# Patient Record
Sex: Male | Born: 1935 | Race: White | Hispanic: No | Marital: Married | Smoking: Former smoker
Health system: Southern US, Community
[De-identification: ages and names within clinical notes are randomized; demographics above are authoritative.]

## PROBLEM LIST (undated history)

## (undated) DIAGNOSIS — I10 Essential (primary) hypertension: Secondary | ICD-10-CM

## (undated) DIAGNOSIS — I499 Cardiac arrhythmia, unspecified: Secondary | ICD-10-CM

## (undated) DIAGNOSIS — G709 Myoneural disorder, unspecified: Secondary | ICD-10-CM

## (undated) DIAGNOSIS — M1990 Unspecified osteoarthritis, unspecified site: Secondary | ICD-10-CM

## (undated) DIAGNOSIS — C4490 Unspecified malignant neoplasm of skin, unspecified: Secondary | ICD-10-CM

## (undated) DIAGNOSIS — D649 Anemia, unspecified: Secondary | ICD-10-CM

## (undated) DIAGNOSIS — I639 Cerebral infarction, unspecified: Secondary | ICD-10-CM

## (undated) DIAGNOSIS — R0602 Shortness of breath: Secondary | ICD-10-CM

## (undated) HISTORY — DX: Myoneural disorder, unspecified: G70.9

## (undated) HISTORY — DX: Unspecified osteoarthritis, unspecified site: M19.90

## (undated) HISTORY — DX: Shortness of breath: R06.02

## (undated) HISTORY — PX: TONSILLECTOMY: SUR1361

## (undated) HISTORY — PX: CHOLECYSTECTOMY: SHX55

## (undated) HISTORY — DX: Essential (primary) hypertension: I10

## (undated) HISTORY — DX: Unspecified malignant neoplasm of skin, unspecified: C44.90

## (undated) HISTORY — DX: Cerebral infarction, unspecified: I63.9

---

## 1997-10-20 ENCOUNTER — Encounter: Admission: RE | Admit: 1997-10-20 | Discharge: 1998-01-18 | Payer: Self-pay | Admitting: Family Medicine

## 1998-09-29 ENCOUNTER — Encounter: Payer: Self-pay | Admitting: Orthopedic Surgery

## 1998-09-29 ENCOUNTER — Inpatient Hospital Stay (HOSPITAL_COMMUNITY): Admission: RE | Admit: 1998-09-29 | Discharge: 1998-10-06 | Payer: Self-pay | Admitting: Orthopedic Surgery

## 1998-10-13 ENCOUNTER — Inpatient Hospital Stay (HOSPITAL_COMMUNITY): Admission: EM | Admit: 1998-10-13 | Discharge: 1998-10-16 | Payer: Self-pay | Admitting: Emergency Medicine

## 1998-10-13 ENCOUNTER — Encounter: Payer: Self-pay | Admitting: Emergency Medicine

## 1999-04-04 ENCOUNTER — Emergency Department (HOSPITAL_COMMUNITY): Admission: EM | Admit: 1999-04-04 | Discharge: 1999-04-04 | Payer: Self-pay | Admitting: Emergency Medicine

## 1999-05-11 ENCOUNTER — Ambulatory Visit: Admission: RE | Admit: 1999-05-11 | Discharge: 1999-05-11 | Payer: Self-pay | Admitting: Otolaryngology

## 1999-05-12 ENCOUNTER — Encounter (INDEPENDENT_AMBULATORY_CARE_PROVIDER_SITE_OTHER): Payer: Self-pay | Admitting: Specialist

## 1999-05-12 ENCOUNTER — Ambulatory Visit (HOSPITAL_BASED_OUTPATIENT_CLINIC_OR_DEPARTMENT_OTHER): Admission: RE | Admit: 1999-05-12 | Discharge: 1999-05-13 | Payer: Self-pay | Admitting: Otolaryngology

## 2001-03-29 ENCOUNTER — Encounter (INDEPENDENT_AMBULATORY_CARE_PROVIDER_SITE_OTHER): Payer: Self-pay | Admitting: *Deleted

## 2001-03-29 ENCOUNTER — Other Ambulatory Visit: Admission: RE | Admit: 2001-03-29 | Discharge: 2001-03-29 | Payer: Self-pay | Admitting: Gastroenterology

## 2001-04-05 ENCOUNTER — Encounter: Admission: RE | Admit: 2001-04-05 | Discharge: 2001-04-05 | Payer: Self-pay | Admitting: Internal Medicine

## 2001-04-05 ENCOUNTER — Encounter (HOSPITAL_BASED_OUTPATIENT_CLINIC_OR_DEPARTMENT_OTHER): Payer: Self-pay | Admitting: Internal Medicine

## 2001-04-19 ENCOUNTER — Encounter: Admission: RE | Admit: 2001-04-19 | Discharge: 2001-06-19 | Payer: Self-pay | Admitting: Internal Medicine

## 2001-08-12 ENCOUNTER — Encounter: Admission: RE | Admit: 2001-08-12 | Discharge: 2001-09-21 | Payer: Self-pay | Admitting: Orthopedic Surgery

## 2001-08-14 ENCOUNTER — Encounter: Payer: Self-pay | Admitting: Orthopedic Surgery

## 2001-09-25 ENCOUNTER — Encounter: Payer: Self-pay | Admitting: Orthopedic Surgery

## 2001-09-25 ENCOUNTER — Encounter (HOSPITAL_BASED_OUTPATIENT_CLINIC_OR_DEPARTMENT_OTHER): Admission: RE | Admit: 2001-09-25 | Discharge: 2001-11-30 | Payer: Self-pay | Admitting: Orthopedic Surgery

## 2001-09-25 ENCOUNTER — Ambulatory Visit (HOSPITAL_COMMUNITY): Admission: RE | Admit: 2001-09-25 | Discharge: 2001-09-25 | Payer: Self-pay | Admitting: Orthopedic Surgery

## 2002-03-04 ENCOUNTER — Encounter (HOSPITAL_BASED_OUTPATIENT_CLINIC_OR_DEPARTMENT_OTHER): Admission: RE | Admit: 2002-03-04 | Discharge: 2002-06-02 | Payer: Self-pay | Admitting: Orthopedic Surgery

## 2002-06-03 ENCOUNTER — Encounter (HOSPITAL_BASED_OUTPATIENT_CLINIC_OR_DEPARTMENT_OTHER): Admission: RE | Admit: 2002-06-03 | Discharge: 2002-09-01 | Payer: Self-pay | Admitting: Internal Medicine

## 2002-09-09 ENCOUNTER — Encounter (HOSPITAL_BASED_OUTPATIENT_CLINIC_OR_DEPARTMENT_OTHER): Admission: RE | Admit: 2002-09-09 | Discharge: 2002-12-04 | Payer: Self-pay | Admitting: Internal Medicine

## 2002-12-05 ENCOUNTER — Encounter (HOSPITAL_BASED_OUTPATIENT_CLINIC_OR_DEPARTMENT_OTHER): Admission: RE | Admit: 2002-12-05 | Discharge: 2003-03-05 | Payer: Self-pay | Admitting: Internal Medicine

## 2003-11-20 ENCOUNTER — Encounter: Admission: RE | Admit: 2003-11-20 | Discharge: 2003-11-20 | Payer: Self-pay | Admitting: Internal Medicine

## 2004-08-27 ENCOUNTER — Ambulatory Visit: Payer: Self-pay | Admitting: Internal Medicine

## 2005-02-03 ENCOUNTER — Ambulatory Visit: Payer: Self-pay | Admitting: Internal Medicine

## 2005-05-26 ENCOUNTER — Ambulatory Visit: Payer: Self-pay | Admitting: Internal Medicine

## 2005-05-27 ENCOUNTER — Ambulatory Visit: Payer: Self-pay | Admitting: Internal Medicine

## 2005-09-27 ENCOUNTER — Ambulatory Visit: Payer: Self-pay | Admitting: Internal Medicine

## 2005-12-12 ENCOUNTER — Ambulatory Visit: Payer: Self-pay | Admitting: Internal Medicine

## 2006-01-06 ENCOUNTER — Encounter: Admission: RE | Admit: 2006-01-06 | Discharge: 2006-01-06 | Payer: Self-pay | Admitting: Internal Medicine

## 2006-03-06 ENCOUNTER — Ambulatory Visit (HOSPITAL_COMMUNITY): Admission: RE | Admit: 2006-03-06 | Discharge: 2006-03-07 | Payer: Self-pay | Admitting: General Surgery

## 2006-03-06 ENCOUNTER — Encounter (INDEPENDENT_AMBULATORY_CARE_PROVIDER_SITE_OTHER): Payer: Self-pay | Admitting: *Deleted

## 2006-04-11 ENCOUNTER — Ambulatory Visit: Payer: Self-pay | Admitting: Internal Medicine

## 2006-08-31 ENCOUNTER — Ambulatory Visit: Payer: Self-pay | Admitting: Internal Medicine

## 2006-09-01 ENCOUNTER — Ambulatory Visit (HOSPITAL_COMMUNITY): Admission: RE | Admit: 2006-09-01 | Discharge: 2006-09-02 | Payer: Self-pay | Admitting: Urology

## 2007-02-08 ENCOUNTER — Ambulatory Visit: Payer: Self-pay | Admitting: Internal Medicine

## 2007-03-02 ENCOUNTER — Ambulatory Visit: Payer: Self-pay | Admitting: Internal Medicine

## 2007-06-28 ENCOUNTER — Ambulatory Visit: Payer: Self-pay | Admitting: Internal Medicine

## 2007-12-06 ENCOUNTER — Ambulatory Visit: Payer: Self-pay | Admitting: Internal Medicine

## 2008-03-04 ENCOUNTER — Ambulatory Visit: Payer: Self-pay | Admitting: Internal Medicine

## 2008-03-04 DIAGNOSIS — J302 Other seasonal allergic rhinitis: Secondary | ICD-10-CM

## 2008-03-04 DIAGNOSIS — R609 Edema, unspecified: Secondary | ICD-10-CM

## 2008-03-04 DIAGNOSIS — E119 Type 2 diabetes mellitus without complications: Secondary | ICD-10-CM | POA: Insufficient documentation

## 2008-03-04 DIAGNOSIS — J3089 Other allergic rhinitis: Secondary | ICD-10-CM

## 2008-03-04 DIAGNOSIS — G4733 Obstructive sleep apnea (adult) (pediatric): Secondary | ICD-10-CM

## 2008-03-04 DIAGNOSIS — E669 Obesity, unspecified: Secondary | ICD-10-CM

## 2008-03-04 DIAGNOSIS — J45998 Other asthma: Secondary | ICD-10-CM | POA: Insufficient documentation

## 2008-03-14 ENCOUNTER — Encounter: Payer: Self-pay | Admitting: Internal Medicine

## 2008-04-24 ENCOUNTER — Ambulatory Visit: Payer: Self-pay | Admitting: Internal Medicine

## 2008-09-15 ENCOUNTER — Ambulatory Visit: Payer: Self-pay | Admitting: Internal Medicine

## 2009-02-02 ENCOUNTER — Ambulatory Visit: Payer: Self-pay | Admitting: Internal Medicine

## 2009-03-26 ENCOUNTER — Ambulatory Visit: Payer: Self-pay | Admitting: Internal Medicine

## 2009-07-08 ENCOUNTER — Ambulatory Visit: Payer: Self-pay | Admitting: Internal Medicine

## 2009-11-25 ENCOUNTER — Ambulatory Visit: Payer: Self-pay | Admitting: Internal Medicine

## 2010-04-23 ENCOUNTER — Ambulatory Visit: Payer: Self-pay | Admitting: Internal Medicine

## 2010-04-28 ENCOUNTER — Ambulatory Visit: Payer: Self-pay | Admitting: Internal Medicine

## 2010-04-29 ENCOUNTER — Ambulatory Visit: Payer: Self-pay | Admitting: Internal Medicine

## 2010-06-23 LAB — BASIC METABOLIC PANEL
BUN: 22 mg/dL (ref 6–23)
CO2: 25 mEq/L (ref 19–32)
Calcium: 9.2 mg/dL (ref 8.4–10.5)
Chloride: 96 mEq/L (ref 96–112)
Creatinine, Ser: 1.2 mg/dL (ref 0.4–1.5)
GFR calc Af Amer: >60 mL/min (ref >60)
GFR calc non Af Amer: 59 mL/min — ABNORMAL LOW (ref >60)
Glucose, Bld: 210 mg/dL — ABNORMAL HIGH (ref 70–99)
Potassium: 4.1 mEq/L (ref 3.5–5.1)
Sodium: 134 mEq/L — ABNORMAL LOW (ref 135–145)

## 2010-06-25 ENCOUNTER — Ambulatory Visit
Admission: RE | Admit: 2010-06-25 | Discharge: 2010-06-25 | Payer: Self-pay | Source: Home / Self Care | Attending: Orthopedic Surgery | Admitting: Orthopedic Surgery

## 2010-06-25 LAB — GLUCOSE, CAPILLARY
Glucose-Capillary: 160 mg/dL — ABNORMAL HIGH (ref 70–99)
Glucose-Capillary: 156 mg/dL — ABNORMAL HIGH (ref 70–99)

## 2010-06-25 LAB — POCT HEMOGLOBIN-HEMACUE: Hemoglobin: 12 g/dL — ABNORMAL LOW (ref 13.0–17.0)

## 2010-07-20 NOTE — Assessment & Plan Note (Signed)
Summary: 1 year/ mbw   Primary Provider/Referring Provider:  Jarome Carter  CC:  follow up visit for allergies--requesting flu shot today.  History of Present Illness: 77 year old man returning for follow-up of allergic rhinitis and asthma with sleep apnea.  Had failed CPAP tolerance after multiple mask changes.  He is status post UPPP.  Wife is here with him and indicates he only occasionally snores.  He is satisfied and notices a little daytime sleepiness.  Continues allergy vaccine at 1:10, giving his own without problems.  We reviewed risk, benefits, and epinephrine.  Uses occasional fexofenadine and Nasonex.  Rarely needs his nebulizer machine for wheezing and almost never needs rescue inhaler.  18-Apr-2009- OSA/ UPPP, rhinitis, asthma     yearly follow up visit-breathing good................................Marland Kitchenwife here Allergy- They feel allergy vaccine is good for him. No problem or concern. Got through most of year but blames ragweed now for stuffy nose and cough. hasn't needed neb treatment in long time. hasn't needed a rescue inhaler and they don't feel need for script now. Has had flu vax. OSA- Had failed CPAP trial- couldn't keep it on and turned it back in. UPPP.  April 23, 2010-  OSA/ UPPP, allergic rhinitis, asthma  ................Marland Kitchenwife here Needs flu shot. Continues allergy vaccine. Wife asserts they help. Continues at 1:10. OSA managed by UPPP. Wife denies he snores much.  Each winter they expect him to get a bronchits with colds. He keeps some cough and phlegm routinely as well as some throat clearing. Denies reflux. Does hear occsional wheeze. Uses neb only in the winter, with no rescue inhaler.     Asthma History    Initial Asthma Severity Rating:    Age range: 12+ years    Symptoms: daily    Nighttime Awakenings: 0-2/month    Interferes w/ normal activity: no limitations    SABA use (not for EIB): 0-2 days/week    Asthma Severity Assessment: Moderate  Persistent   Preventive Screening-Counseling & Management  Alcohol-Tobacco     Smoking Status: quit     Packs/Day: 1.5     Year Quit: 1986  Allergies (verified): No Known Drug Allergies  Comments:  Nurse/Medical Assistant: The patient's medications and allergies were reviewed with the patient and were updated in the Medication and Allergy Lists.  Past History:  Past Medical History: Last updated: 03/04/2008 OBSTRUCTIVE SLEEP APNEA (ICD-327.23) PERIPHERAL EDEMA (ICD-782.3) EXOGENOUS OBESITY (ICD-278.00) DIABETES MELLITUS (ICD-250.00) ALLERGIC RHINITIS (ICD-477.9) -Srtart vaccine 1992 ASTHMA (ICD-493.90)    Past Surgical History: Last updated: Apr 18, 2009 UPPP Tonsils Bilateral TKR Bilateral hip replacement Cholecystectomy  Family History: Last updated: 2009/04/18 Father- died MI Mother- died CVA  Social History: Last updated: 04/18/2009 Married Patient states former smoker.  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  The patient denies melena, shortness of breath at rest, non-productive cough, coughing up blood, chest pain, irregular heartbeats, acid heartburn, indigestion, loss of appetite, weight change, abdominal pain, difficulty swallowing, sore throat, tooth/dental problems, headaches, sneezing, itching, ear ache, anxiety, rash, and fever.         legs and feet swell, elastic hose, cut out diabetic shoes  Vital Signs:  Patient profile:   77 year old male  Weight:      283.50 pounds O2 Sat:      95 % on Room air Pulse rate:   67 / minute BP sitting:   144 / 62  (right arm) Cuff size:   regular  Vitals Entered By: Randell Loop CMA (April 23, 2010  3:18 PM)  O2 Sat at Rest %:  95 O2 Flow:  Room air CC: follow up visit for allergies--requesting flu shot today Is Patient Diabetic? No Pain Assessment Patient in pain? no      Comments meds updated today with pt   Physical Exam  Additional Exam:  General: A/Ox3; pleasant and cooperative, NAD, obese SKIN: no rash, lesions NODES: no lymphadenopathy HEENT: [place]/AT, EOM- WNL, Conjuctivae- clear, PERRLA, TM-WNL, Nose- clear, Throat- clear, s/p UPPP NECK: Supple w/ fair ROM, JVD- none, normal carotid impulses w/o bruits Thyroid- normal to palpation CHEST: Clear to P&A, trace rales in deep bases HEART: RRR, no m/g/r heard ABDOMEN: Soft and nl; nml bowel sounds; no organomegaly or masses noted ZOX:WRUE, nl pulses, 1+edema  NEURO: Grossly intact to observation, resting head tremor      Impression & Recommendations:  Problem # 1:  OBSTRUCTIVE SLEEP APNEA (ICD-327.23)  Contolled after UPP accordiing to wife. They don't describe significant snore or daytime sleepiness.  Weight loss would help  Problem # 2:  ALLERGIC RHINITIS (ICD-477.9)  Well contrrolled and he is comfortable that allergy vaccine helps His updated medication list for this problem includes:    Nasonex 50 Mcg/act Susp (Mometasone furoate) .Marland Kitchen... 2 sprays each nostril at bedtime    Fexofenadine Hcl 180 Mg Tabs (Fexofenadine hcl) .Marland Kitchen... Take 1 tablet by mouth once a day    Ipratropium Bromide 0.03 % Soln (Ipratropium bromide) .Marland Kitchen... Via neb as needed  Problem # 3:  ASTHMA (ICD-493.90) Denies active wheezing now. he is satisfied with his status. We are updating CXR and PFT.   Medications Added to Medication List This Visit: 1)  Tramadol Hcl 100 Mg Xr24h-tab (Tramadol hcl) .... Take one tablet by mouth three times a day as needed 2)  Fish Oil 1000 Mg Caps (Omega-3 fatty acids) .... Take 1 tablet by mouth once a day 3)  Vitamin B-12 1000 Mcg Tabs (Cyanocobalamin) .... Take 1 tablet by mouth once a day  Other  Orders: Est. Patient Level IV (45409) Misc. Referral (Misc. Ref) T-2 View CXR (71020TC) Influenza Vaccine MCR (81191)  Patient Instructions: 1)  Please schedule a follow-up appointment in 1 month. 2)  Continue allergy vaccine 3)  Flu vax 4)  See PCC to schedule PFT 5)  A chest x-ray has been recommended.  Your imaging study may require preauthorization.    Immunizations Administered:  Influenza Vaccine # 1:    Vaccine Type: Fluvax MCR    Site: left deltoid    Mfr: Novartis    Dose: 0.5 ml    Route: IM    Given by: Vivianne Spence    Exp. Date: 11/19/2010    Lot #: [id number]    VIS given: 01/12/10 version given April 26, 2010.  Flu Vaccine Consent Questions:    Do you have a history of severe allergic reactions to this vaccine? no    Any prior history of allergic reactions to egg and/or gelatin? no    Do you have a sensitivity to the preservative Thimersol? no    Do you have a past history of Guillan-Barre Syndrome? no    Do you currently have an acute febrile illness?  no    Have you ever had a severe reaction to latex? no    Vaccine information given and explained to patient? yes

## 2010-07-20 NOTE — Miscellaneous (Signed)
Summary: Orders Update pft charges  Clinical Lists Changes  Orders: Added new Service order of Carbon Monoxide diffusing w/capacity (94720) - Signed Added new Service order of Lung Volumes (94240) - Signed Added new Service order of Spirometry (Pre & Post) (94060) - Signed 

## 2010-09-30 ENCOUNTER — Other Ambulatory Visit: Payer: Self-pay

## 2010-11-02 NOTE — Assessment & Plan Note (Signed)
[facility]                             PULMONARY OFFICE NOTE   BARKLEY, KRATOCHVIL                        MRN:          [id number]  DATE:03/02/2007                            DOB:          Feb 03, 1932    PULMONARY FOLLOWUP   PROBLEM LIST:  1. Asthma.  2. Allergic rhinitis.  3. Diabetes.  4. Exogenous obesity.  5. Peripheral edema.  6. Obstructive sleep apnea/palatoplasty.   HISTORY:  Sleep apnea symptoms have been under good control and he feels  overall better compared with last visit.  Sometimes wakes at night.  Nothing specific.  Asthma has not been bad.  He uses his nebulized  albuterol and Atrovent very occasionally p.r.n.  Asks about tinea on his  trunk.  Wife continues to give his allergy vaccine at 1:10.  Today, we  went over risk benefit considerations of allergy vaccine and discussed  issues related to administration outside of a medical office,  anaphylaxis and epinephrine.  He wishes to continue as he is doing.   MEDICATIONS:  Reviewed.   OBJECTIVE:  Weight 269 pounds, which if correct is a significant  improvement compared with a year ago when he was recorded at 303 pounds,  BP 142/78, pulse 80, room air saturation 94%.  He uses a walker.  Very obese.  In no acute distress.  CHEST:  Clear.  Breathing was unlabored.  HEART:  Sounds are regular without murmur.  I find no adenopathy, neck  vein distension, or edema.   IMPRESSION:  Rhinitis, asthma, and obstructive sleep apnea are all under  good control.  Weight loss would make a substantial improvement.  Note,  a chest x-ray of June 2007 showed chronic changes, nothing acute.  Incidental tinea.   PLAN:  1. Check on PFT.  2. EpiPen prescription with discussion.  3. Vaccine discussion as above.  4. Environmental precautions and sleep hygiene reviewed.  Weight loss      encouraged.  5. Try Diflucan 150 mg x1 for his tinea.  6. Schedule return in 1 year, earlier p.r.n.     Clinton D. Maple Hudson, MD, Tonny Bollman, FACP  Electronically Signed    CDY/MedQ  DD: 03/03/2007  DT: 03/04/2007  Job #: [id number]   cc:   Barry Dienes. Eloise Harman, M.D.

## 2010-11-05 NOTE — Op Note (Signed)
NAME:  Preston Carter, Preston Carter                 ACCOUNT NO.:  [id number]   MEDICAL RECORD NO.:  [id number]          PATIENT TYPE:  AMB   LOCATION:  DAY                          FACILITY:  [facility]   PHYSICIAN:  Sigmund I. Patsi Sears, M.D.DATE OF BIRTH:  [birth year]   DATE OF PROCEDURE:  09/01/2006  DATE OF DISCHARGE:                               OPERATIVE REPORT   PREOPERATIVE DIAGNOSIS:  Diabetic phimosis.   POSTOPERATIVE DIAGNOSIS:  Diabetic phimosis.   OPERATION:  Circumcision.   SURGEON:  Sigmund I. Patsi Sears, M.D.   ANESTHESIA:  General LMA.Marland Kitchen   PREPARATION:  After appropriate preanesthesia, the patient is brought to  the operating room, placed on the operating room in the dorsal supine  position where general LMA anesthesia was induced.  He was then replaced  in dorsal lithotomy position where the pubis was prepped with Betadine  solution and draped in the usual fashion.   REVIEW OF HISTORY:  This 77 year old type 2 diabetic has chronic  balanitis, and phimosis, and is for circumcision today.  Note has past  history of sleep apnea, as well as hypertension, diabetes, asthma,  ulcers, and depression.   PROCEDURE:  After prepping the penis with Betadine solution and draped  in usual fashion, the marking pen was used to mark the site of  periglandular and pericoronal incisions.  The foreskin was clamped, and  excised.  Four separate quadrants were then developed  using four  Monocryl suture, and each quadrant was closed with interrupted 4-0  Monocryl suture.  Sterile dressing was applied.  The patient was  awakened and taken recovery room in good condition.  He was given  15 mg  IV Toradol, as well as IV Ancef at the beginning of case.      Sigmund I. Patsi Sears, M.D.  Electronically Signed     SIT/MEDQ  D:  09/01/2006  T:  09/02/2006  Job:  [id number]

## 2010-11-05 NOTE — Consult Note (Signed)
[facility]  Patient:    Preston Carter, Preston Carter Visit Number: [id number] MRN: [id number]          Service Type: FTC Location: FOOT Attending Physician:  Sharren Bridge Dictated by:   Jonelle Sports. Cheryll Cockayne, M.D. Proc. Date: 04/19/01 Admit Date:  04/19/2001   CC:         Preston Carter. Eloise Harman, M.D.   Consultation Report  HISTORY OF PRESENT ILLNESS:  This 77 year old white male with well controlled type 2 diabetes was referred for evaluation and management of left foot pain with question of Charcot neuroarthropathy. Apparently the pain has been present for a couple of months without any known injury and the patient was seen some three or four weeks ago by Dr. Eloise Harman who thought perhaps this was as Charcot process and x-rayed him one week ago with some confirmation of that diagnosis. He is accordingly referred here now for our further management. He has had no secondary cellulitis or other problems of which he is aware. He simply has some persistent discomfort in the right mid foot and advancing change in appearance of his foot.  PAST MEDICAL HISTORY:  The patient has question early Parkinsons disease and hypertension as well as some low back problems as well as his type 2 diabetes. He has no know medicinal allergies.  MEDICATIONS:  1. Glucophage.  2. Elavil.  3. Paxil.  4. Lipitor.  5. Darvocet-N 100.  6. Lotensin.  7. Maxzide.  8. Sinemet.  9. Ambien. 10. Magnesium. 11. Ventolin. 12. Nasal saline spray. 13. Multivitamin. 14. Theo-Dur. 15. 81 mg aspirin. 16. Allegra. 17. Voltaren. 18. Several other vitamins.  PHYSICAL EXAMINATION:  Examination today is limited to the distal lower extremities. The patients feet are without gross edema although there is very subtle swelling of the left foot in its entirety. Interestingly, the feet are characterized as well by considerable loss of longitudinal metatarsal arch and there has been a clear shift of  the mid foot toward the weightbearing surface particularly on the left. The toes are strikingly angulated in the fibular direction, not characteristic for what is seen with diabetic neuropathy but presumably on that basis. Skin temperatures are essentially normal in both feet but there is a differential of approximately 2 degrees on the left foot as compared to the right. Some areas of scattered dry skin but this is not striking. All pulses are palpable and adequate. Monofilament testing suggests that he has protective sensation throughout the surfaces of both feet but this is clearly diminished.  IMPRESSION:   Charcot neuroarthropathy of the left foot, clinically typical and with suggestive x-ray.  DISPOSITION:  1. The patient was given instruction regarding foot care and diabetes by     a video presentation with nurse and physician reinforcement.  2. The patient is placed in a Cam walker for the left lower extremity and     advised to keep this on all of his waking hours. He will probably later     need custom inserts and possibly even a PTB device on the left to     further protect that foot. He is to continue his Darvocet and Tylenol     as he has previously been taking and return follow-up is given to this     clinic in 10 days with Dr. [name]. Dictated by:   Jonelle Sports Cheryll Cockayne, M.D. Attending Physician:  Sharren Bridge DD:  05/02/01 TD:  05/02/01 Job: [id number]

## 2010-11-05 NOTE — Op Note (Signed)
NAMETRAVUS, OREN NO.:  [id number]   MEDICAL RECORD NO.:  [id number]          PATIENT TYPE:  OIB   LOCATION:  5731                         FACILITY:  [facility]   PHYSICIAN:  Ollen Gross. Vernell Morgans, M.D. DATE OF BIRTH:  1932-06-08   DATE OF PROCEDURE:  03/06/2006  DATE OF DISCHARGE:  03/07/2006                                 OPERATIVE REPORT   PREOPERATIVE DIAGNOSIS:  Gallstones.   POSTOPERATIVE DIAGNOSES:  Gallstones.   PROCEDURE:  Laparoscopic cholecystectomy with intraoperative cholangiogram.   SURGEON:  Dr. Carolynne Edouard.   ASSISTANT:  Dr. Jamey Ripa.   ANESTHESIA:  General endotracheal.   PROCEDURE:  After informed consent was obtained, the patient was brought to  the operating room and placed in supine position on the operating table.  After adequate induction of general anesthesia, the patient's abdomen was  prepped with Betadine and draped in the usual sterile manner.  The area  below the umbilicus was infiltrated with 0.25% Marcaine.  A small incision  was made with the 15 blade knife.  This incision was carried down through  the subcutaneous tissue bluntly with a hemostat and [employer] retractors  until the linea alba was identified.  The linea alba was incised with a 15  blade knife, and each side was grasped with Kocher clamps and elevated  anteriorly.  The preperitoneal space was then probed bluntly with a hemostat  until the peritoneum was opened and access was gained to the abdominal  cavity.  A 0 Vicryl pursestring stitch was then placed in the fascia around  the opening.  Hasson cannula was placed through the opening and anchored in  place with the previously placed Vicryl pursestring stitch.  The abdomen was  then insufflated with carbon dioxide without difficulty.  The patient was  placed in a head-up position and rotated slightly with the right side up.  The laparoscope was inserted through the Hasson cannula, and the right upper  quadrant was inspected.   The dome of the gallbladder and liver were readily  identified.  Next, the epigastric region was infiltrated with 0.25%  Marcaine, and a small incision was made with the 15 blade knife, and a 10 mm  port was placed bluntly through this incision into the abdominal cavity  under direct vision.  Sites were then chosen laterally on the right side of  the abdomen for placement of 5 mm ports.  Each of these areas were  infiltrated with 0.25% Marcaine.  Small stab incisions were made with a 15  blade knife, and 5 mm ports were placed bluntly through these incisions into  the abdominal cavity under direct vision.  A blunt grasper was placed  through the lateral-most 5 mm port and used to grasp the dome of gallbladder  and elevate it anteriorly and superiorly.  The liver was noted be fairly  nodular.  Another blunt grasper was placed through the other 5 mm port and  used to retract on the body and neck of the gallbladder.  Dissector was  placed through the epigastric port and using the electrocautery, the  peritoneal reflection at the gallbladder neck area was opened.  Blunt  dissection was then carried out in this area until the gallbladder neck  cystic duct junction was readily identified, and a good window was created.  The patient appeared to have an anterior-running cystic artery, and this was  also dissected bluntly in a circumferential manner.  Two clips were placed  proximally and 1 distally in the artery, and the artery was divided between  the two.  Next, a single clip was placed on the gallbladder neck, and a  small ductotomy was made just below the clip with the laparoscopic scissors.  A 14 gauge Angiocath was placed percutaneously through the anterior  abdominal wall under direct vision.  A Reddick cholangiogram catheter was  placed through the Angiocath and flushed.  The Reddick catheter was then  placed within the cystic duct and anchored in place with a clip.  Cholangiogram was  obtained that showed good emptying into the duodenum,  adequate length on the cystic duct, and no filling defects.  The anchoring  clip and catheters were removed from the patient.  Three clips were placed  proximally on the cystic duct.  The duct was divided between the 2 sets of  clips.  Next, a laparoscopic hook cautery device was used to separate the  gallbladder from the liver bed.  Prior to completely detaching the  gallbladder from the liver bed, the liver bed was inspected.  A couple of  small vessels in the liver bed were controlled with clips and then cautery  until hemostatic.  The gallbladder was then detached the rest of the way  from the liver bed without difficulty.  The laparoscopic bag was inserted  through the epigastric port.  The gallbladder was placed within the bag, and  the bag was sealed.  The abdomen was then irrigated with copious amounts of  saline until the effluent was clear.  The liver bed was inspected again and  found to be hemostatic.  The laparoscope was then moved to the epigastric  port, and the gallbladder grasper was placed through the [facility] cannula and  used to grasp the opening of the bag.  The bag with the gallbladder was  removed with the Hasson cannula through the infraumbilical port without  difficulty.  The fascial defect was closed with the previously placed Vicryl  pursestring stitch as well as with another interrupted 0 Vicryl stitch.  The  rest of the ports were removed under direct vision.  The gas was allowed to  escape.  The skin incisions were all closed with interrupted 4-0 Monocryl  subcuticular stitches.  Benzoin, Steri-Strips, and sterile dressings were  applied.  The patient tolerated the procedure well.  At the end of the case,  all needle, sponge, and instrument counts were correct.  The patient was  then awakened and taken to recovery in stable condition.      Ollen Gross. Vernell Morgans, M.D. Electronically Signed     PST/MEDQ  D:   03/08/2006  T:  03/09/2006  Job:  [id number]

## 2010-11-11 ENCOUNTER — Ambulatory Visit (INDEPENDENT_AMBULATORY_CARE_PROVIDER_SITE_OTHER): Payer: Self-pay

## 2010-11-11 DIAGNOSIS — J309 Allergic rhinitis, unspecified: Secondary | ICD-10-CM

## 2011-04-27 ENCOUNTER — Encounter (HOSPITAL_COMMUNITY): Payer: Self-pay | Admitting: Pharmacy Technician

## 2011-05-03 ENCOUNTER — Encounter (HOSPITAL_COMMUNITY): Payer: Self-pay

## 2011-05-03 ENCOUNTER — Ambulatory Visit (HOSPITAL_COMMUNITY)
Admission: RE | Admit: 2011-05-03 | Discharge: 2011-05-03 | Disposition: A | Discharge status: Home / Self Care | Payer: Medicare Other | Source: Ambulatory Visit | Attending: Orthopedic Surgery | Admitting: Orthopedic Surgery

## 2011-05-03 ENCOUNTER — Encounter (HOSPITAL_COMMUNITY): Payer: Medicare Other

## 2011-05-03 ENCOUNTER — Other Ambulatory Visit: Payer: Self-pay

## 2011-05-03 ENCOUNTER — Other Ambulatory Visit: Payer: Self-pay | Admitting: Orthopedic Surgery

## 2011-05-03 DIAGNOSIS — R0602 Shortness of breath: Secondary | ICD-10-CM

## 2011-05-03 DIAGNOSIS — G709 Myoneural disorder, unspecified: Secondary | ICD-10-CM

## 2011-05-03 DIAGNOSIS — Z01818 Encounter for other preprocedural examination: Secondary | ICD-10-CM | POA: Insufficient documentation

## 2011-05-03 DIAGNOSIS — I452 Bifascicular block: Secondary | ICD-10-CM | POA: Insufficient documentation

## 2011-05-03 DIAGNOSIS — C4490 Unspecified malignant neoplasm of skin, unspecified: Secondary | ICD-10-CM

## 2011-05-03 DIAGNOSIS — I498 Other specified cardiac arrhythmias: Secondary | ICD-10-CM | POA: Insufficient documentation

## 2011-05-03 DIAGNOSIS — Z01810 Encounter for preprocedural cardiovascular examination: Secondary | ICD-10-CM | POA: Insufficient documentation

## 2011-05-03 DIAGNOSIS — M1990 Unspecified osteoarthritis, unspecified site: Secondary | ICD-10-CM

## 2011-05-03 DIAGNOSIS — Z01812 Encounter for preprocedural laboratory examination: Secondary | ICD-10-CM | POA: Insufficient documentation

## 2011-05-03 DIAGNOSIS — I639 Cerebral infarction, unspecified: Secondary | ICD-10-CM

## 2011-05-03 HISTORY — DX: Shortness of breath: R06.02

## 2011-05-03 HISTORY — PX: JOINT REPLACEMENT: SHX530

## 2011-05-03 HISTORY — DX: Unspecified osteoarthritis, unspecified site: M19.90

## 2011-05-03 HISTORY — PX: EYE SURGERY: SHX253

## 2011-05-03 HISTORY — DX: Myoneural disorder, unspecified: G70.9

## 2011-05-03 HISTORY — PX: UVULOPALATOPHARYNGOPLASTY: SHX827

## 2011-05-03 HISTORY — DX: Unspecified malignant neoplasm of skin, unspecified: C44.90

## 2011-05-03 HISTORY — PX: CARPAL TUNNEL RELEASE: SHX101

## 2011-05-03 HISTORY — DX: Cerebral infarction, unspecified: I63.9

## 2011-05-03 LAB — URINALYSIS, ROUTINE W REFLEX MICROSCOPIC
Glucose, UA: NEGATIVE mg/dL
Hgb urine dipstick: NEGATIVE
Specific Gravity, Urine: 1.029 (ref 1.005–1.030)
Urobilinogen, UA: 0.2 mg/dL (ref 0.0–1.0)

## 2011-05-03 LAB — SURGICAL PCR SCREEN
MRSA, PCR: NEGATIVE
Staphylococcus aureus: POSITIVE — AB

## 2011-05-03 LAB — PROTIME-INR: Prothrombin Time: 13.9 seconds (ref 11.6–15.2)

## 2011-05-03 LAB — COMPREHENSIVE METABOLIC PANEL
ALT: 9 U/L (ref 0–53)
Albumin: 3.7 g/dL (ref 3.5–5.2)
Alkaline Phosphatase: 57 U/L (ref 39–117)
Potassium: 4 mEq/L (ref 3.5–5.1)
Sodium: 136 mEq/L (ref 135–145)
Total Protein: 7.2 g/dL (ref 6.0–8.3)

## 2011-05-03 LAB — URINE MICROSCOPIC-ADD ON

## 2011-05-03 LAB — CBC
MCHC: 34.7 g/dL (ref 30.0–36.0)
RDW: 13.5 % (ref 11.5–15.5)

## 2011-05-03 LAB — APTT: aPTT: 34 seconds (ref 24–37)

## 2011-05-03 MED ORDER — CHLORHEXIDINE GLUCONATE 4 % EX LIQD: 60.0000 mL | Freq: Once | CUTANEOUS | Status: AC

## 2011-05-03 NOTE — Patient Instructions (Signed)
20 Preston Carter  05/03/2011   Your procedure is scheduled on: 05-11-11  Report to [facility] at 1200 noon.  Call this number if you have problems the morning of surgery: [phone]   Remember:   Do not eat food:After Midnight.  Do not drink clear liquids: 6 Hours before arrival. None past 08:00am 05-11-11  Take these medicines the morning of surgery with A SIP OF WATER: Stalevo, Loratadine, Tramadol. Do not take any diabetic meds Am of surgery. Bring eye drops, Inhalers and nasal spray with you.  Do not wear jewelry, make-up or nail polish.  Do not wear lotions, powders, or perfumes. You may wear deodorant.  Do not shave 48 hours prior to surgery.  Do not bring valuables to the hospital.  Contacts, dentures or bridgework may not be worn into surgery.  Leave suitcase in the car. After surgery it may be brought to your room.  For patients admitted to the hospital, checkout time is 11:00 AM the day of discharge.   Patients discharged the day of surgery will not be allowed to drive home.  Name and phone number of your driver: Preston Carter, spouse [phone]/ [phone]  Special Instructions: CHG Shower Use Special Wash: 1/2 bottle night before surgery and 1/2 bottle morning of surgery.   Please read over the following fact sheets that you were given: Blood Transfusion Information and MRSA Information, Incentive Spirometry Instructions.

## 2011-05-03 NOTE — Pre-Procedure Instructions (Addendum)
05-03-11 CXR, Rt. Hip XR, EKG to be done today. 05-03-11-Surgical Clearance note with chart-Dr. Eloise Harman. 05-04-11- Pt's spouse -Britta Mccreedy notified of positive PCR -staph aureus- will fill Rx for Mupirocin and use as directed.

## 2011-05-04 ENCOUNTER — Ambulatory Visit (INDEPENDENT_AMBULATORY_CARE_PROVIDER_SITE_OTHER): Payer: Medicare Other

## 2011-05-04 DIAGNOSIS — J309 Allergic rhinitis, unspecified: Secondary | ICD-10-CM

## 2011-05-05 ENCOUNTER — Other Ambulatory Visit: Payer: Self-pay | Admitting: Orthopedic Surgery
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
## 2011-05-10 MED ORDER — BUPIVACAINE 0.25 % ON-Q PUMP SINGLE CATH 300ML
300.0000 mL | INJECTION | Status: DC
  Filled 2011-05-10: qty 300

## 2011-05-11 ENCOUNTER — Encounter (HOSPITAL_COMMUNITY): Payer: Self-pay | Admitting: Anesthesiology

## 2011-05-11 ENCOUNTER — Encounter (HOSPITAL_COMMUNITY)
Admission: RE | Disposition: A | Discharge status: Skilled Nursing Facility | Payer: Self-pay | Source: Ambulatory Visit | Attending: Orthopedic Surgery

## 2011-05-11 ENCOUNTER — Encounter (HOSPITAL_COMMUNITY): Payer: Self-pay | Admitting: Orthopedic Surgery

## 2011-05-11 ENCOUNTER — Inpatient Hospital Stay (HOSPITAL_COMMUNITY): Payer: Medicare Other

## 2011-05-11 ENCOUNTER — Inpatient Hospital Stay (HOSPITAL_COMMUNITY)
Admission: RE | Admit: 2011-05-11 | Discharge: 2011-05-19 | DRG: 466 | Disposition: A | Discharge status: Skilled Nursing Facility | Payer: Medicare Other | Source: Ambulatory Visit | Attending: Orthopedic Surgery | Admitting: Orthopedic Surgery

## 2011-05-11 ENCOUNTER — Encounter (HOSPITAL_COMMUNITY): Payer: Self-pay | Admitting: *Deleted

## 2011-05-11 ENCOUNTER — Inpatient Hospital Stay (HOSPITAL_COMMUNITY): Payer: Medicare Other | Admitting: Anesthesiology

## 2011-05-11 DIAGNOSIS — Z96659 Presence of unspecified artificial knee joint: Secondary | ICD-10-CM

## 2011-05-11 DIAGNOSIS — E7800 Pure hypercholesterolemia, unspecified: Secondary | ICD-10-CM | POA: Diagnosis present

## 2011-05-11 DIAGNOSIS — E871 Hypo-osmolality and hyponatremia: Secondary | ICD-10-CM | POA: Diagnosis not present

## 2011-05-11 DIAGNOSIS — D62 Acute posthemorrhagic anemia: Secondary | ICD-10-CM | POA: Diagnosis not present

## 2011-05-11 DIAGNOSIS — D696 Thrombocytopenia, unspecified: Secondary | ICD-10-CM | POA: Diagnosis present

## 2011-05-11 DIAGNOSIS — I8390 Asymptomatic varicose veins of unspecified lower extremity: Secondary | ICD-10-CM | POA: Diagnosis present

## 2011-05-11 DIAGNOSIS — T84049A Periprosthetic fracture around unspecified internal prosthetic joint, initial encounter: Secondary | ICD-10-CM | POA: Diagnosis present

## 2011-05-11 DIAGNOSIS — Z79899 Other long term (current) drug therapy: Secondary | ICD-10-CM

## 2011-05-11 DIAGNOSIS — Y831 Surgical operation with implant of artificial internal device as the cause of abnormal reaction of the patient, or of later complication, without mention of misadventure at the time of the procedure: Secondary | ICD-10-CM | POA: Diagnosis present

## 2011-05-11 DIAGNOSIS — Z8673 Personal history of transient ischemic attack (TIA), and cerebral infarction without residual deficits: Secondary | ICD-10-CM

## 2011-05-11 DIAGNOSIS — F05 Delirium due to known physiological condition: Secondary | ICD-10-CM | POA: Diagnosis not present

## 2011-05-11 DIAGNOSIS — Z96649 Presence of unspecified artificial hip joint: Secondary | ICD-10-CM

## 2011-05-11 DIAGNOSIS — Z87891 Personal history of nicotine dependence: Secondary | ICD-10-CM

## 2011-05-11 DIAGNOSIS — D649 Anemia, unspecified: Secondary | ICD-10-CM | POA: Diagnosis present

## 2011-05-11 DIAGNOSIS — G309 Alzheimer's disease, unspecified: Secondary | ICD-10-CM | POA: Diagnosis present

## 2011-05-11 DIAGNOSIS — E876 Hypokalemia: Secondary | ICD-10-CM | POA: Diagnosis not present

## 2011-05-11 DIAGNOSIS — Z85828 Personal history of other malignant neoplasm of skin: Secondary | ICD-10-CM

## 2011-05-11 DIAGNOSIS — J449 Chronic obstructive pulmonary disease, unspecified: Secondary | ICD-10-CM | POA: Diagnosis present

## 2011-05-11 DIAGNOSIS — I10 Essential (primary) hypertension: Secondary | ICD-10-CM | POA: Diagnosis present

## 2011-05-11 DIAGNOSIS — E119 Type 2 diabetes mellitus without complications: Secondary | ICD-10-CM | POA: Diagnosis present

## 2011-05-11 DIAGNOSIS — N179 Acute kidney failure, unspecified: Secondary | ICD-10-CM | POA: Diagnosis not present

## 2011-05-11 DIAGNOSIS — Z6841 Body Mass Index (BMI) 40.0 and over, adult: Secondary | ICD-10-CM

## 2011-05-11 DIAGNOSIS — E8779 Other fluid overload: Secondary | ICD-10-CM | POA: Diagnosis not present

## 2011-05-11 DIAGNOSIS — T84039A Mechanical loosening of unspecified internal prosthetic joint, initial encounter: Principal | ICD-10-CM | POA: Diagnosis present

## 2011-05-11 DIAGNOSIS — J4489 Other specified chronic obstructive pulmonary disease: Secondary | ICD-10-CM | POA: Diagnosis present

## 2011-05-11 DIAGNOSIS — G20 Parkinson's disease: Secondary | ICD-10-CM | POA: Diagnosis present

## 2011-05-11 DIAGNOSIS — G4730 Sleep apnea, unspecified: Secondary | ICD-10-CM | POA: Diagnosis present

## 2011-05-11 DIAGNOSIS — J690 Pneumonitis due to inhalation of food and vomit: Secondary | ICD-10-CM | POA: Diagnosis not present

## 2011-05-11 DIAGNOSIS — G20A1 Parkinson's disease without dyskinesia, without mention of fluctuations: Secondary | ICD-10-CM | POA: Diagnosis present

## 2011-05-11 DIAGNOSIS — D509 Iron deficiency anemia, unspecified: Secondary | ICD-10-CM

## 2011-05-11 DIAGNOSIS — R509 Fever, unspecified: Secondary | ICD-10-CM | POA: Diagnosis not present

## 2011-05-11 DIAGNOSIS — F0280 Dementia in other diseases classified elsewhere without behavioral disturbance: Secondary | ICD-10-CM | POA: Diagnosis present

## 2011-05-11 HISTORY — PX: TOTAL HIP REVISION: SHX763

## 2011-05-11 LAB — GLUCOSE, CAPILLARY
Glucose-Capillary: 156 mg/dL — ABNORMAL HIGH (ref 70–99)
Glucose-Capillary: 170 mg/dL — ABNORMAL HIGH (ref 70–99)

## 2011-05-11 LAB — ABO/RH: ABO/RH(D): O NEG

## 2011-05-11 SURGERY — TOTAL HIP REVISION
Anesthesia: General | Site: Hip | Laterality: Right | Wound class: Clean

## 2011-05-11 MED ORDER — BISACODYL 5 MG PO TBEC: 10.0000 mg | DELAYED_RELEASE_TABLET | Freq: Every day | ORAL | Status: DC | PRN

## 2011-05-11 MED ORDER — ONDANSETRON HCL 4 MG/2ML IJ SOLN
4.0000 mg | Freq: Four times a day (QID) | INTRAMUSCULAR | Status: DC | PRN
  Administered 2011-05-14: 4 mg via INTRAVENOUS
  Filled 2011-05-11: qty 2

## 2011-05-11 MED ORDER — INSULIN ASPART 100 UNIT/ML ~~LOC~~ SOLN
0.0000 [IU] | Freq: Three times a day (TID) | SUBCUTANEOUS | Status: DC
  Administered 2011-05-12 (×3): 3 [IU] via SUBCUTANEOUS
  Administered 2011-05-13: 2 [IU] via SUBCUTANEOUS
  Administered 2011-05-13: 3 [IU] via SUBCUTANEOUS
  Administered 2011-05-13: 5 [IU] via SUBCUTANEOUS
  Administered 2011-05-14 (×2): 3 [IU] via SUBCUTANEOUS
  Filled 2011-05-11 (×2): qty 3

## 2011-05-11 MED ORDER — ALBUTEROL SULFATE (5 MG/ML) 0.5% IN NEBU
2.5000 mg | INHALATION_SOLUTION | RESPIRATORY_TRACT | Status: DC | PRN
  Administered 2011-05-17: 2.5 mg via RESPIRATORY_TRACT
  Filled 2011-05-11: qty 0.5

## 2011-05-11 MED ORDER — OLMESARTAN MEDOXOMIL 20 MG PO TABS
20.0000 mg | ORAL_TABLET | Freq: Every day | ORAL | Status: DC
  Administered 2011-05-11 – 2011-05-13 (×3): 20 mg via ORAL
  Filled 2011-05-11 (×4): qty 1

## 2011-05-11 MED ORDER — LIDOCAINE HCL (CARDIAC) 20 MG/ML IV SOLN
INTRAVENOUS | Status: DC | PRN
  Administered 2011-05-11: 50 mg via INTRAVENOUS

## 2011-05-11 MED ORDER — POTASSIUM CHLORIDE CRYS ER 20 MEQ PO TBCR
20.0000 meq | EXTENDED_RELEASE_TABLET | Freq: Every day | ORAL | Status: DC
  Administered 2011-05-12 – 2011-05-13 (×2): 20 meq via ORAL
  Filled 2011-05-11 (×3): qty 1

## 2011-05-11 MED ORDER — FLUTICASONE PROPIONATE 50 MCG/ACT NA SUSP
1.0000 | Freq: Every day | NASAL | Status: DC
  Administered 2011-05-12 – 2011-05-19 (×8): 1 via NASAL
  Filled 2011-05-11: qty 16

## 2011-05-11 MED ORDER — ONDANSETRON HCL 4 MG PO TABS: 4.0000 mg | ORAL_TABLET | Freq: Four times a day (QID) | ORAL | Status: DC | PRN

## 2011-05-11 MED ORDER — FENTANYL CITRATE 0.05 MG/ML IJ SOLN
INTRAMUSCULAR | Status: DC | PRN
  Administered 2011-05-11 (×2): 50 ug via INTRAVENOUS
  Administered 2011-05-11: 100 ug via INTRAVENOUS
  Administered 2011-05-11: 25 ug via INTRAVENOUS
  Administered 2011-05-11: 50 ug via INTRAVENOUS
  Administered 2011-05-11: 25 ug via INTRAVENOUS

## 2011-05-11 MED ORDER — ONDANSETRON HCL 4 MG/2ML IJ SOLN
INTRAMUSCULAR | Status: DC | PRN
  Administered 2011-05-11: 4 mg via INTRAVENOUS

## 2011-05-11 MED ORDER — NEOSTIGMINE METHYLSULFATE 1 MG/ML IJ SOLN
INTRAMUSCULAR | Status: DC | PRN
  Administered 2011-05-11: 5 mg via INTRAVENOUS

## 2011-05-11 MED ORDER — PHENOL 1.4 % MT LIQD: 1.0000 | OROMUCOSAL | Status: DC | PRN

## 2011-05-11 MED ORDER — METHOCARBAMOL 100 MG/ML IJ SOLN
500.0000 mg | Freq: Four times a day (QID) | INTRAVENOUS | Status: DC | PRN
  Administered 2011-05-11: 500 mg via INTRAVENOUS
  Filled 2011-05-11: qty 5

## 2011-05-11 MED ORDER — ACETAMINOPHEN 10 MG/ML IV SOLN
1000.0000 mg | Freq: Four times a day (QID) | INTRAVENOUS | Status: AC
  Administered 2011-05-11 – 2011-05-12 (×3): 1000 mg via INTRAVENOUS
  Filled 2011-05-11 (×5): qty 100

## 2011-05-11 MED ORDER — METOCLOPRAMIDE HCL 10 MG PO TABS: 5.0000 mg | ORAL_TABLET | Freq: Three times a day (TID) | ORAL | Status: DC | PRN

## 2011-05-11 MED ORDER — SODIUM CHLORIDE 0.9 % IV SOLN
INTRAVENOUS | Status: DC
  Administered 2011-05-11: 19:00:00 via INTRAVENOUS

## 2011-05-11 MED ORDER — METFORMIN HCL 500 MG PO TABS: 500.0000 mg | ORAL_TABLET | Freq: Every day | ORAL | Status: DC

## 2011-05-11 MED ORDER — LORATADINE 10 MG PO TABS
10.0000 mg | ORAL_TABLET | Freq: Every day | ORAL | Status: DC
  Administered 2011-05-11 – 2011-05-19 (×9): 10 mg via ORAL
  Filled 2011-05-11 (×11): qty 1

## 2011-05-11 MED ORDER — IPRATROPIUM-ALBUTEROL 0.5-2.5 (3) MG/3ML IN SOLN: 3.0000 mL | RESPIRATORY_TRACT | Status: DC | PRN

## 2011-05-11 MED ORDER — NIACIN 500 MG PO TABS
500.0000 mg | ORAL_TABLET | Freq: Two times a day (BID) | ORAL | Status: DC
  Administered 2011-05-12 – 2011-05-19 (×15): 500 mg via ORAL
  Filled 2011-05-11 (×16): qty 1

## 2011-05-11 MED ORDER — PROPOFOL 10 MG/ML IV EMUL
INTRAVENOUS | Status: DC | PRN
  Administered 2011-05-11: 200 mg via INTRAVENOUS

## 2011-05-11 MED ORDER — WARFARIN SODIUM 7.5 MG PO TABS
7.5000 mg | ORAL_TABLET | Freq: Once | ORAL | Status: AC
  Administered 2011-05-11: 7.5 mg via ORAL
  Filled 2011-05-11: qty 1

## 2011-05-11 MED ORDER — SIMVASTATIN 40 MG PO TABS
40.0000 mg | ORAL_TABLET | Freq: Every day | ORAL | Status: DC
  Administered 2011-05-11 – 2011-05-18 (×8): 40 mg via ORAL
  Filled 2011-05-11 (×9): qty 1

## 2011-05-11 MED ORDER — HYDROMORPHONE HCL PF 1 MG/ML IJ SOLN
0.2500 mg | INTRAMUSCULAR | Status: DC | PRN
  Administered 2011-05-11: 0.25 mg via INTRAVENOUS
  Administered 2011-05-11 (×2): 0.5 mg via INTRAVENOUS
  Administered 2011-05-11: 0.25 mg via INTRAVENOUS

## 2011-05-11 MED ORDER — CARBIDOPA-LEVODOPA-ENTACAPONE 25-100-200 MG PO TABS: 1.0000 | ORAL_TABLET | Freq: Three times a day (TID) | ORAL | Status: DC

## 2011-05-11 MED ORDER — TRIAMTERENE-HCTZ 75-50 MG PO TABS
1.0000 | ORAL_TABLET | Freq: Every day | ORAL | Status: DC
  Administered 2011-05-12 – 2011-05-13 (×2): 1 via ORAL
  Filled 2011-05-11 (×3): qty 1

## 2011-05-11 MED ORDER — HYDROMORPHONE HCL PF 1 MG/ML IJ SOLN
INTRAMUSCULAR | Status: AC
  Administered 2011-05-11: 0.5 mg via INTRAVENOUS
  Filled 2011-05-11: qty 1

## 2011-05-11 MED ORDER — CEFAZOLIN SODIUM-DEXTROSE 2-3 GM-% IV SOLR
2.0000 g | Freq: Once | INTRAVENOUS | Status: AC
  Administered 2011-05-11: 2 g via INTRAVENOUS

## 2011-05-11 MED ORDER — FUROSEMIDE 40 MG PO TABS
40.0000 mg | ORAL_TABLET | Freq: Every day | ORAL | Status: DC
  Administered 2011-05-11 – 2011-05-13 (×3): 40 mg via ORAL
  Filled 2011-05-11 (×4): qty 1

## 2011-05-11 MED ORDER — CEFAZOLIN SODIUM 1-5 GM-% IV SOLN
1.0000 g | Freq: Four times a day (QID) | INTRAVENOUS | Status: AC
  Administered 2011-05-11 – 2011-05-12 (×3): 1 g via INTRAVENOUS
  Filled 2011-05-11 (×3): qty 50

## 2011-05-11 MED ORDER — EPHEDRINE SULFATE 50 MG/ML IJ SOLN
INTRAMUSCULAR | Status: DC | PRN
  Administered 2011-05-11: 10 mg via INTRAVENOUS
  Administered 2011-05-11: 5 mg via INTRAVENOUS
  Administered 2011-05-11: 10 mg via INTRAVENOUS
  Administered 2011-05-11: 5 mg via INTRAVENOUS

## 2011-05-11 MED ORDER — MAGNESIUM HYDROXIDE 400 MG/5ML PO SUSP: 30.0000 mL | Freq: Two times a day (BID) | ORAL | Status: DC | PRN

## 2011-05-11 MED ORDER — LACTATED RINGERS IV SOLN: INTRAVENOUS | Status: DC

## 2011-05-11 MED ORDER — GLYCOPYRROLATE 0.2 MG/ML IJ SOLN
INTRAMUSCULAR | Status: DC | PRN
  Administered 2011-05-11: .6 mg via INTRAVENOUS

## 2011-05-11 MED ORDER — SUCCINYLCHOLINE CHLORIDE 20 MG/ML IJ SOLN
INTRAMUSCULAR | Status: DC | PRN
  Administered 2011-05-11: 100 mg via INTRAVENOUS

## 2011-05-11 MED ORDER — FLEET ENEMA 7-19 GM/118ML RE ENEM: 1.0000 | ENEMA | Freq: Every day | RECTAL | Status: DC | PRN

## 2011-05-11 MED ORDER — CISATRACURIUM BESYLATE 2 MG/ML IV SOLN
INTRAVENOUS | Status: DC | PRN
  Administered 2011-05-11: 8 mg via INTRAVENOUS
  Administered 2011-05-11: 10 mg via INTRAVENOUS

## 2011-05-11 MED ORDER — MORPHINE SULFATE 2 MG/ML IJ SOLN
1.0000 mg | INTRAMUSCULAR | Status: DC | PRN
  Administered 2011-05-11 – 2011-05-12 (×5): 1 mg via INTRAVENOUS
  Filled 2011-05-11 (×4): qty 1

## 2011-05-11 MED ORDER — ASPIRIN EC 81 MG PO TBEC
81.0000 mg | DELAYED_RELEASE_TABLET | Freq: Every day | ORAL | Status: DC
  Administered 2011-05-12 – 2011-05-13 (×2): 81 mg via ORAL
  Filled 2011-05-11 (×3): qty 1

## 2011-05-11 MED ORDER — IPRATROPIUM BROMIDE 0.02 % IN SOLN
0.5000 mg | RESPIRATORY_TRACT | Status: DC | PRN
  Administered 2011-05-17: 0.5 mg via RESPIRATORY_TRACT
  Filled 2011-05-11: qty 2.5

## 2011-05-11 MED ORDER — DIPHENHYDRAMINE HCL 12.5 MG/5ML PO ELIX: 12.5000 mg | ORAL_SOLUTION | ORAL | Status: DC | PRN

## 2011-05-11 MED ORDER — ACETAMINOPHEN 325 MG PO TABS
650.0000 mg | ORAL_TABLET | Freq: Four times a day (QID) | ORAL | Status: DC | PRN
  Administered 2011-05-13 – 2011-05-18 (×9): 650 mg via ORAL
  Filled 2011-05-11 (×10): qty 2

## 2011-05-11 MED ORDER — POLYETHYLENE GLYCOL 3350 17 G PO PACK
17.0000 g | PACK | Freq: Every day | ORAL | Status: DC | PRN
  Filled 2011-05-11: qty 1

## 2011-05-11 MED ORDER — METHOCARBAMOL 500 MG PO TABS
500.0000 mg | ORAL_TABLET | Freq: Four times a day (QID) | ORAL | Status: DC | PRN
  Administered 2011-05-12 – 2011-05-13 (×3): 500 mg via ORAL
  Filled 2011-05-11 (×4): qty 1

## 2011-05-11 MED ORDER — GLIMEPIRIDE 2 MG PO TABS
2.0000 mg | ORAL_TABLET | Freq: Every day | ORAL | Status: DC
  Administered 2011-05-12 – 2011-05-14 (×3): 2 mg via ORAL
  Filled 2011-05-11 (×4): qty 1

## 2011-05-11 MED ORDER — ENTACAPONE 200 MG PO TABS
200.0000 mg | ORAL_TABLET | Freq: Three times a day (TID) | ORAL | Status: DC
  Administered 2011-05-11 – 2011-05-19 (×23): 200 mg via ORAL
  Filled 2011-05-11 (×25): qty 1

## 2011-05-11 MED ORDER — KETOCONAZOLE 2 % EX CREA
1.0000 "application " | TOPICAL_CREAM | Freq: Every day | CUTANEOUS | Status: DC | PRN
  Filled 2011-05-11: qty 15

## 2011-05-11 MED ORDER — BISACODYL 10 MG RE SUPP
10.0000 mg | Freq: Every day | RECTAL | Status: DC | PRN
  Administered 2011-05-15: 10 mg via RECTAL
  Filled 2011-05-11: qty 1

## 2011-05-11 MED ORDER — OXYCODONE HCL 5 MG PO TABS
5.0000 mg | ORAL_TABLET | ORAL | Status: DC | PRN
  Administered 2011-05-11: 5 mg via ORAL
  Administered 2011-05-12 (×6): 10 mg via ORAL
  Administered 2011-05-13: 5 mg via ORAL
  Administered 2011-05-13 (×2): 10 mg via ORAL
  Administered 2011-05-14: 5 mg via ORAL
  Administered 2011-05-14 (×2): 10 mg via ORAL
  Filled 2011-05-11 (×2): qty 2
  Filled 2011-05-11: qty 1
  Filled 2011-05-11 (×2): qty 2
  Filled 2011-05-11: qty 1
  Filled 2011-05-11: qty 2
  Filled 2011-05-11: qty 1
  Filled 2011-05-11 (×4): qty 2
  Filled 2011-05-11: qty 1
  Filled 2011-05-11: qty 2

## 2011-05-11 MED ORDER — METOCLOPRAMIDE HCL 5 MG/ML IJ SOLN: 5.0000 mg | Freq: Three times a day (TID) | INTRAMUSCULAR | Status: DC | PRN

## 2011-05-11 MED ORDER — CARBIDOPA-LEVODOPA 25-100 MG PO TABS
1.0000 | ORAL_TABLET | Freq: Three times a day (TID) | ORAL | Status: DC
  Administered 2011-05-11 – 2011-05-19 (×23): 1 via ORAL
  Filled 2011-05-11 (×26): qty 1

## 2011-05-11 MED ORDER — SODIUM CHLORIDE 0.9 % IR SOLN
Status: DC | PRN
  Administered 2011-05-11: 1000 mL

## 2011-05-11 MED ORDER — DOCUSATE SODIUM 100 MG PO CAPS
100.0000 mg | ORAL_CAPSULE | Freq: Two times a day (BID) | ORAL | Status: DC
  Administered 2011-05-11 – 2011-05-18 (×14): 100 mg via ORAL
  Filled 2011-05-11 (×17): qty 1

## 2011-05-11 MED ORDER — MENTHOL 3 MG MT LOZG: 1.0000 | LOZENGE | OROMUCOSAL | Status: DC | PRN

## 2011-05-11 MED ORDER — TRAMADOL HCL 50 MG PO TABS: 50.0000 mg | ORAL_TABLET | Freq: Three times a day (TID) | ORAL | Status: DC | PRN

## 2011-05-11 MED ORDER — HETASTARCH-ELECTROLYTES 6 % IV SOLN
INTRAVENOUS | Status: DC | PRN
  Administered 2011-05-11: 16:00:00 via INTRAVENOUS

## 2011-05-11 MED ORDER — HYDROMORPHONE HCL PF 1 MG/ML IJ SOLN
INTRAMUSCULAR | Status: AC
  Filled 2011-05-11: qty 1

## 2011-05-11 MED ORDER — MORPHINE SULFATE 2 MG/ML IJ SOLN
INTRAMUSCULAR | Status: AC
  Administered 2011-05-11: 1 mg via INTRAVENOUS
  Filled 2011-05-11: qty 1

## 2011-05-11 MED ORDER — DONEPEZIL HCL 5 MG PO TABS
5.0000 mg | ORAL_TABLET | Freq: Every day | ORAL | Status: DC
  Administered 2011-05-11 – 2011-05-18 (×8): 5 mg via ORAL
  Filled 2011-05-11 (×9): qty 1

## 2011-05-11 MED ORDER — OLOPATADINE HCL 0.1 % OP SOLN
1.0000 [drp] | Freq: Every day | OPHTHALMIC | Status: DC | PRN
  Filled 2011-05-11: qty 5

## 2011-05-11 MED ORDER — LACTATED RINGERS IV SOLN
INTRAVENOUS | Status: DC
  Administered 2011-05-11: 17:00:00 via INTRAVENOUS
  Administered 2011-05-11: 1000 mL via INTRAVENOUS
  Administered 2011-05-11 (×2): via INTRAVENOUS

## 2011-05-11 MED ORDER — TEMAZEPAM 15 MG PO CAPS
30.0000 mg | ORAL_CAPSULE | Freq: Every day | ORAL | Status: DC
  Administered 2011-05-12 – 2011-05-14 (×3): 30 mg via ORAL
  Filled 2011-05-11: qty 1
  Filled 2011-05-11 (×2): qty 2
  Filled 2011-05-11: qty 1

## 2011-05-11 MED ORDER — PROMETHAZINE HCL 25 MG/ML IJ SOLN: 6.2500 mg | INTRAMUSCULAR | Status: DC | PRN

## 2011-05-11 MED ORDER — ACETAMINOPHEN 650 MG RE SUPP: 650.0000 mg | Freq: Four times a day (QID) | RECTAL | Status: DC | PRN

## 2011-05-11 SURGICAL SUPPLY — 58 items
BAG ZIPLOCK 12X15 (MISCELLANEOUS) ×6 IMPLANT
BIT DRILL 2.8X128 (BIT) ×4 IMPLANT
BLADE EXTENDED COATED 6.5IN (ELECTRODE) ×2 IMPLANT
BLADE SAW SAG 73X25 THK (BLADE) ×1
BLADE SAW SGTL 73X25 THK (BLADE) ×1 IMPLANT
CABLE (Orthopedic Implant) ×8 IMPLANT
CATH KIT ON-Q SILVERSOAK 5IN (CATHETERS) IMPLANT
CLOTH BEACON ORANGE TIMEOUT ST (SAFETY) ×2 IMPLANT
DRAPE INCISE IOBAN 66X45 STRL (DRAPES) ×2 IMPLANT
DRAPE LG THREE QUARTER DISP (DRAPES) ×2 IMPLANT
DRAPE ORTHO SPLIT 77X108 STRL (DRAPES) ×2
DRAPE POUCH INSTRU U-SHP 10X18 (DRAPES) ×2 IMPLANT
DRAPE SURG ORHT 6 SPLT 77X108 (DRAPES) ×2 IMPLANT
DRAPE U-SHAPE 47X51 STRL (DRAPES) ×2 IMPLANT
DRAPE X-RAY CASS 24X20 (DRAPES) ×4 IMPLANT
DRSG EMULSION OIL 3X16 NADH (GAUZE/BANDAGES/DRESSINGS) ×2 IMPLANT
DRSG MEPILEX BORDER 4X12 (GAUZE/BANDAGES/DRESSINGS) ×8 IMPLANT
DRSG MEPILEX BORDER 4X4 (GAUZE/BANDAGES/DRESSINGS) ×4 IMPLANT
DURAPREP 26ML APPLICATOR (WOUND CARE) ×2 IMPLANT
ELECT REM PT RETURN 9FT ADLT (ELECTROSURGICAL) ×2
ELECTRODE REM PT RTRN 9FT ADLT (ELECTROSURGICAL) ×1 IMPLANT
EVACUATOR 1/8 PVC DRAIN (DRAIN) ×2 IMPLANT
FACESHIELD LNG OPTICON STERILE (SAFETY) ×10 IMPLANT
GAUZE SPONGE 4X4 12PLY STRL LF (GAUZE/BANDAGES/DRESSINGS) ×2 IMPLANT
GLOVE BIO SURGEON STRL SZ7.5 (GLOVE) ×4 IMPLANT
GLOVE BIO SURGEON STRL SZ8 (GLOVE) ×2 IMPLANT
GLOVE BIOGEL PI IND STRL 8 (GLOVE) ×3 IMPLANT
GLOVE BIOGEL PI INDICATOR 8 (GLOVE) ×3
GOWN PREVENTION PLUS XLARGE (GOWN DISPOSABLE) ×4 IMPLANT
GOWN STRL REIN XL XLG (GOWN DISPOSABLE) ×4 IMPLANT
HEAD FEM SROM 32 +6 (Hips) ×2 IMPLANT
IMMOBILIZER KNEE 20 (SOFTGOODS)
IMMOBILIZER KNEE 20 THIGH 36 (SOFTGOODS) IMPLANT
INSERT ACTB 10D 58X32XSTRL LF (Orthopedic Implant) ×1 IMPLANT
INSERT OSTEO CFIRE ACET (Orthopedic Implant) ×1 IMPLANT
INSRT ACTB 10D 58X32XSTRL LF (Orthopedic Implant) ×1 IMPLANT
KIT BASIN OR (CUSTOM PROCEDURE TRAY) ×2 IMPLANT
MANIFOLD NEPTUNE II (INSTRUMENTS) ×2 IMPLANT
NS IRRIG 1000ML POUR BTL (IV SOLUTION) ×2 IMPLANT
PACK TOTAL JOINT (CUSTOM PROCEDURE TRAY) ×2 IMPLANT
PASSER SUT SWANSON 36MM LOOP (INSTRUMENTS) ×2 IMPLANT
POSITIONER SURGICAL ARM (MISCELLANEOUS) ×2 IMPLANT
SLEEVE SROM 22F XXL (Hips) ×1 IMPLANT
SPONGE GAUZE 4X4 12PLY (GAUZE/BANDAGES/DRESSINGS) ×2 IMPLANT
SPONGE LAP 18X18 X RAY DECT (DISPOSABLE) ×4 IMPLANT
SROM SLEEVE 22F XXL (Hips) ×2 IMPLANT
SROM STM LNG 36X8L 22X17X230R (Hips) ×2 IMPLANT
STAPLER VISISTAT 35W (STAPLE) ×4 IMPLANT
STEM LNG 36X8L 22X17X230R (Hips) ×1 IMPLANT
SUCTION FRAZIER TIP 10 FR DISP (SUCTIONS) ×2 IMPLANT
SUT ETHIBOND NAB CT1 #1 30IN (SUTURE) ×4 IMPLANT
SUT VIC AB 1 CT1 27 (SUTURE) ×10
SUT VIC AB 1 CT1 27XBRD ANTBC (SUTURE) ×10 IMPLANT
SUT VIC AB 2-0 CT1 27 (SUTURE) ×4
SUT VIC AB 2-0 CT1 TAPERPNT 27 (SUTURE) ×4 IMPLANT
TOWEL OR 17X26 10 PK STRL BLUE (TOWEL DISPOSABLE) ×4 IMPLANT
TRAY FOLEY CATH 14FRSI W/METER (CATHETERS) ×2 IMPLANT
WATER STERILE IRR 1500ML POUR (IV SOLUTION) ×2 IMPLANT

## 2011-05-11 NOTE — Progress Notes (Signed)
ANTICOAGULATION CONSULT NOTE - Initial Consult  Pharmacy Consult for Coumadin Indication: VTE prophylaxis  No Known Allergies  Patient Measurements: Height: 5\' 6"  (167.6 cm) Weight: 280 lb (127.007 kg) IBW/kg (Calculated) : 63.8  Adjusted Body Weight:   Vital Signs: Temp: 98.1 F (36.7 C) (11/21 2000) Temp src: Oral (11/21 2000) BP: 138/72 mmHg (11/21 2000) Pulse Rate: 85  (11/21 2000)  Labs: No results found for this basename: HGB:2,HCT:3,PLT:3,APTT:3,LABPROT:3,INR:3,HEPARINUNFRC:3,CREATININE:3,CKTOTAL:3,CKMB:3,TROPONINI:3 in the last 72 hours Estimated Creatinine Clearance: 42.8 ml/min (by C-G formula based on Cr of 1.82).  Medical History: Past Medical History  Diagnosis Date  . Hypertension   . Stroke 05-03-11    TIA- more than 20 yrs ago.  . Asthma   . Shortness of breath 05-03-11    SOB with exertion only  . Diabetes mellitus 05-03-11    Oral meds used-Dabetes x5 yrs-CBG's (145)  . Neuromuscular disorder 05-03-11    Dx. Parkinson's -essential tremors-generally, ambulates with walker  . Arthritis 05-03-11    most joints- Bil. hips/knees replaced in the past  . Skin cancer 05-03-11    Past hx. skin cancer lesions-removed  . DEMENTIA 05-03-11    hx. Alzheimers-tx. Aricept    Medications:  Prescriptions prior to admission  Medication Sig Dispense Refill  . carbidopa-levodopa-entacapone (STALEVO) 25-100-200 MG per tablet Take 1 tablet by mouth 3 (three) times daily.       . cholecalciferol (VITAMIN D) 1000 UNITS tablet Take 1,000 Units by mouth daily.        Marland Kitchen donepezil (ARICEPT) 5 MG tablet Take 5 mg by mouth at bedtime.       . furosemide (LASIX) 40 MG tablet Take 40 mg by mouth every morning.       Marland Kitchen glimepiride (AMARYL) 2 MG tablet Take 2 mg by mouth daily. Pt takes at 2 pm       . guaiFENesin-dextromethorphan (ROBITUSSIN DM) 100-10 MG/5ML syrup Take 5 mLs by mouth 3 (three) times daily as needed. cough      . HYDROcodone-acetaminophen (NORCO) 5-325 MG per  tablet Take 1 tablet by mouth every 6 (six) hours as needed. Pain       . ketoconazole (NIZORAL) 2 % cream Apply 1 application topically daily as needed. itching      . loratadine (CLARITIN) 10 MG tablet Take 10 mg by mouth every morning.       . metFORMIN (GLUCOPHAGE) 500 MG tablet Take 500 mg by mouth every morning.       . mometasone (NASONEX) 50 MCG/ACT nasal spray Place 2 sprays into the nose daily as needed. Allergies        . niacin 500 MG tablet Take 500 mg by mouth 2 (two) times daily with a meal.        . olmesartan (BENICAR) 20 MG tablet Take 20 mg by mouth every morning.       . Olopatadine HCl (PATADAY) 0.2 % SOLN Place 1 drop into both eyes daily as needed. Dry eye      . OVER THE COUNTER MEDICATION Take 1 tablet by mouth 3 (three) times daily. Lithium Aspartate 5 mg       . potassium chloride SA (K-DUR,KLOR-CON) 20 MEQ tablet Take 20 mEq by mouth every morning.       Marland Kitchen PRESCRIPTION MEDICATION Inject 0.5 mLs as directed every 7 (seven) days. Allergy shots       . simvastatin (ZOCOR) 40 MG tablet Take 40 mg by mouth at bedtime.       Marland Kitchen  temazepam (RESTORIL) 30 MG capsule Take 30 mg by mouth at bedtime.       . traMADol (ULTRAM) 50 MG tablet Take 50 mg by mouth every 8 (eight) hours as needed. Pain        . triamterene-hydrochlorothiazide (MAXZIDE) 75-50 MG per tablet Take 1 tablet by mouth every morning.       Marland Kitchen aspirin EC 81 MG tablet Take 81 mg by mouth every morning.        Marland Kitchen ipratropium-albuterol (DUONEB) 0.5-2.5 (3) MG/3ML SOLN Take 3 mLs by nebulization every 4 (four) hours as needed. wheezing      . Multiple Vitamins-Minerals (MULTIVITAMINS THER. W/MINERALS) TABS Take 1 tablet by mouth daily.        . multivitamin-lutein (OCUVITE-LUTEIN) CAPS Take 1 capsule by mouth daily.        . Omega-3 Fatty Acids (FISH OIL) 1200 MG CAPS Take 1 capsule by mouth 2 (two) times daily.        . vitamin C (ASCORBIC ACID) 500 MG tablet Take 500 mg by mouth daily.        Scheduled:    .  acetaminophen  1,000 mg Intravenous Q6H  . aspirin EC  81 mg Oral Daily  . carbidopa-levodopa  1 tablet Oral TID  . ceFAZolin (ANCEF) IV  1 g Intravenous Q6H  . ceFAZolin (ANCEF) IV  2 g Intravenous Once  . docusate sodium  100 mg Oral BID  . donepezil  5 mg Oral QHS  . entacapone  200 mg Oral TID  . fluticasone  1 spray Each Nare Daily  . furosemide  40 mg Oral Daily  . glimepiride  2 mg Oral Daily  . HYDROmorphone      . insulin aspart  0-15 Units Subcutaneous TID WC  . loratadine  10 mg Oral Daily  . morphine      . niacin  500 mg Oral BID WC  . olmesartan  20 mg Oral Daily  . potassium chloride SA  20 mEq Oral Daily  . simvastatin  40 mg Oral QHS  . temazepam  30 mg Oral QHS  . triamterene-hydrochlorothiazide  1 tablet Oral Daily  . DISCONTD: carbidopa-levodopa-entacapone  1 tablet Oral TID  . DISCONTD: metFORMIN  500 mg Oral Q breakfast   Infusions:    . sodium chloride 100 mL/hr at 05/11/11 1858  . lactated ringers    . DISCONTD: bupivacaine 0.25 % ON-Q pump SINGLE CATH 300 mL    . DISCONTD: lactated ringers     PRN: acetaminophen, acetaminophen, albuterol, bisacodyl, bisacodyl, diphenhydrAMINE, ipratropium, ketoconazole, magnesium hydroxide, menthol-cetylpyridinium, methocarbamol(ROBAXIN) IV, methocarbamol, metoCLOPramide (REGLAN) injection, metoCLOPramide, morphine, olopatadine, ondansetron (ZOFRAN) IV, ondansetron, oxyCODONE, phenol, polyethylene glycol, sodium phosphate, traMADol, DISCONTD: HYDROmorphone, DISCONTD: ipratropium-albuterol DISCONTD: promethazine, DISCONTD: sodium chloride irrigation Anti-infectives     Start     Dose/Rate Route Frequency Ordered Stop   05/11/11 2200   ceFAZolin (ANCEF) IVPB 1 g/50 mL premix        1 g 100 mL/hr over 30 Minutes Intravenous Every 6 hours 05/11/11 2011 05/12/11 1559   05/11/11 1400   ceFAZolin (ANCEF) IVPB 2 g/50 mL premix        2 g 100 mL/hr over 30 Minutes Intravenous  Once 05/11/11 1301 05/11/11 1511           Assessment: 76 yo male s/p R hip revision. Goal of Therapy:  INR 2-3   Plan:  Coumadin 7.5mg  tonight, Daily protimes.   Chilton Si, Keora Eccleston L 05/11/2011,9:15 PM

## 2011-05-11 NOTE — Anesthesia Procedure Notes (Signed)
Procedure Name: Intubation Date/Time: 05/11/2011 3:10 PM Performed by: [place], Munira Polson C Pre-anesthesia Checklist: Patient identified, Emergency Drugs available, Suction available, Patient being monitored and Timeout performed Patient Re-evaluated:Patient Re-evaluated prior to inductionOxygen Delivery Method: Circle System Utilized Preoxygenation: Pre-oxygenation with 100% oxygen Intubation Type: IV induction Ventilation: Mask ventilation without difficulty and Oral airway inserted - appropriate to patient size Laryngoscope Size: Mac and 4 Tube type: Oral Number of attempts: 1 Placement Confirmation: ETT inserted through vocal cords under direct vision,  positive ETCO2,  CO2 detector and breath sounds checked- equal and bilateral Secured at: 23 cm Tube secured with: Tape Dental Injury: Teeth and Oropharynx as per pre-operative assessment

## 2011-05-11 NOTE — Transfer of Care (Signed)
Immediate Anesthesia Transfer of Care Note  Patient: Preston Carter  Procedure(s) Performed:  TOTAL HIP REVISION  Patient Location: PACU  Anesthesia Type: General  Level of Consciousness: awake and alert   Airway & Oxygen Therapy: Patient Spontanous Breathing and Patient connected to face mask oxygen  Post-op Assessment: Report given to PACU RN and Post -op Vital signs reviewed and stable  Post vital signs: Reviewed and stable  Complications: No apparent anesthesia complications
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
## 2011-05-11 NOTE — Anesthesia Preprocedure Evaluation (Signed)
Anesthesia Evaluation    Airway Mallampati: II TM Distance: >3 FB     Dental  (+) Edentulous Upper and Edentulous Lower   Pulmonary shortness of breath and with exertion, asthma , sleep apnea , former smoker clear to auscultation  Pulmonary exam normal       Cardiovascular hypertension, Regular Normal    Neuro/Psych PSYCHIATRIC DISORDERS  Neuromuscular disease CVA, No Residual Symptoms    GI/Hepatic   Endo/Other  Diabetes mellitus-, Well Controlled, Type 2, Oral Hypoglycemic AgentsMorbid obesity  Renal/GU   Genitourinary negative   Musculoskeletal negative musculoskeletal ROS (+)   Abdominal (+) obese,   Peds  Hematology negative hematology ROS (+)   Anesthesia Other Findings   Reproductive/Obstetrics negative OB ROS                           Anesthesia Physical Anesthesia Plan  ASA: III  Anesthesia Plan: General   Post-op Pain Management:    Induction: Intravenous  Airway Management Planned: Oral ETT  Additional Equipment:   Intra-op Plan:   Post-operative Plan: Extubation in OR  Informed Consent: I have reviewed the patients History and Physical, chart, labs and discussed the procedure including the risks, benefits and alternatives for the proposed anesthesia with the patient or authorized representative who has indicated his/her understanding and acceptance.   Dental advisory given  Plan Discussed with: CRNA  Anesthesia Plan Comments:         Anesthesia Quick Evaluation

## 2011-05-11 NOTE — Brief Op Note (Signed)
05/11/2011  6:14 PM  PATIENT:  Lorelee New  77 y.o. male  PRE-OPERATIVE DIAGNOSIS:  Loose Right Total Hip Arthroplasty  POST-OPERATIVE DIAGNOSIS:  loose right total hip arthroplasty  PROCEDURE:  Procedure(s): TOTAL HIP REVISION-RIGHT  SURGEON:  Surgeon(s): Gus Rankin Tac[place] Athanas  PHYSICIAN ASSISTANT:   ASSISTANTS: Avel Peace, PA-C   ANESTHESIA:   general  EBL:  Total I/O In: 2800 [I.V.:2300; IV Piggyback:500] Out: 750 [Urine:200; Blood:550]  BLOOD ADMINISTERED:none  DRAINS: (Medium) Hemovact drain(s) in the right thigh with  Suction Open   LOCAL MEDICATIONS USED:  NONE  SPECIMEN:  No Specimen  DISPOSITION OF SPECIMEN:  N/A  COUNTS:  YES  TOURNIQUET:  * No tourniquets in log *  DICTATION: .Other Dictation: Dictation Number [id number]  PLAN OF CARE: Admit to inpatient   PATIENT DISPOSITION:  PACU - hemodynamically stable.   Delay start of Pharmacological VTE agent (>24hrs) due to surgical blood loss or risk of bleeding:  Yes  Gus Rankin. Felice Hope, MD    05/11/2011, 6:15 PM

## 2011-05-11 NOTE — Anesthesia Postprocedure Evaluation (Signed)
Anesthesia Post Note  Patient: Preston Carter  Procedure(s) Performed:  TOTAL HIP REVISION  Anesthesia type: General  Patient location: PACU  Post pain: Pain level controlled  Post assessment: Post-op Vital signs reviewed  Last Vitals:  Filed Vitals:   05/11/11 1900  BP: 125/48  Pulse:   Temp: 36.1 C  Resp:     Post vital signs: Reviewed  Level of consciousness: sedated  Complications: No apparent anesthesia complications

## 2011-05-11 NOTE — Interval H&P Note (Signed)
History and Physical Interval Note:   05/11/2011   2:52 PM   Preston Carter  has presented today for surgery, with the diagnosis of Loose Right Total Hip Arthroplasty  The various methods of treatment have been discussed with the patient and family. After consideration of risks, benefits and other options for treatment, the patient has consented to  Procedure(s): TOTAL HIP REVISION as a surgical intervention .  The patients' history has been reviewed, patient examined, no change in status, stable for surgery.  I have reviewed the patients' chart and labs.  Questions were answered to the patient's satisfaction.     Loanne Drilling  MD

## 2011-05-11 NOTE — Op Note (Unsigned)
Preston Carter, Preston Carter NO.:  [id number]  MEDICAL RECORD NO.:  [id number]  LOCATION:  [facility]                         FACILITY:  [facility]  PHYSICIAN:  Ollen Gross, M.D.    DATE OF BIRTH:  07-16-1931  DATE OF PROCEDURE:  05/11/2011 DATE OF DISCHARGE:                              OPERATIVE REPORT   PREOPERATIVE DIAGNOSIS:  Failed right total hip arthroplasty.  POSTOPERATIVE DIAGNOSIS:  Failed right total hip arthroplasty.Preston Carter  PROCEDURE:  Right total hip arthroplasty revision.  SURGEON:  Ollen Gross, M.D.  ASSISTANT:  Alexzandrew L. Perkins, P.A.C.  ANESTHESIA:  General.  ESTIMATED BLOOD LOSS:  500 mL.  DRAINS:  Hemovac x1.  COMPLICATIONS:  None.  CONDITION:  Stable to recovery.  BRIEF CLINICAL INDICATION:  [name] is a 77 year old male who had a right total hip arthroplasty many years ago.  He has a grossly loose femoral stem, which is about protruding through the lateral cortex of the femur.  He has severe pain with this.  His acetabular component is intact, but appears to have some eccentric polyethylene wear without osteolysis.  He presents now for a total hip arthroplasty revision.  PROCEDURE IN DETAIL:  After successful administration of general anesthetic, the patient was placed in left lateral decubitus position with the right side up and held with a hip positioner.  The right lower extremity isolated from the perineum  with plastic drapes and prepped and draped in the usual sterile fashion.  A standard posterolateral incision was made with a 10 blade through subcutaneous tissue to the level of the fascia lata, which was incised in line with the skin incision.  Sciatic nerve was palpated and protected.  Posterior pseudocapsule was isolated off the femur.  The hip was dislocated.  The femoral head was rotating grossly within the femoral canal.  I had to clear off bone from the inner wedge of the greater trochanter to allow for access to the  lateral neck of the prosthesis.  Then placed the extraction device and was able to remove the entire stem in one unit. There was a lot of granulation tissue present inside the canal, which I removed with a curette.  The femur was then retracted anteriorly to gain acetabular exposure. Acetabular retractors were placed and gained access to the component. There was gross eccentric polyethylene wear in the polyethylene.  There was a 26-mm internal diameter.  I removed the polyethylene from the Osteonics dual geometry acetabular shell.  The shell was in excellent position and was still well-fixed. We decided to  leave it intact.  We placed a trial 32-mm eccentric 10-degree polyethylene liner.  This was a 58-mm acetabular shell.  We then addressed the femur.  The lateral cortex was very thin where the tip of the stem was due to the varus inclination of the stem.  We carefully drilled through the pedestal to get down into the femoral canal distally.  I placed a long straight device down into the canal to confirm that we were within the canal distally.  We then reamed with the flexible reamers up to 18.5 mm for placement of a 22 x 17 SROM stem.  We did  a proximal reaming up to a 22 F and machined this sleeve to an extra extra large.  A 22 F extra extra large trial sleeve was placed.  The trial stem, which was a 22 x 17 long right stem with a 36 + 8 neck was impacted in the stem in about 20 degrees of anteversion.  The trial 32 + 0  head was placed.  It reduces easily, so we went to 32 + 6, which had more appropriate soft tissue tension and excellent stability.  Full extension, full external rotation, 70 degrees flexion, 40 degrees adduction, about 60 degrees internal rotation, 90 degrees of flexion, 60 degrees of internal rotation.  I raised the right leg on top of the left, I felt as though the leg lengths were equal.  The trial was then removed.  The permanent Osteonics series 32 mm 10 degree  eccentric liner was placed at 10 degree elevated rim in the 11 o'clock position.  It was impacted and found to lock into the shell.  We then placed the S-ROM sleeve, the 22 F extra extra large into the proximal femur.  The stem was then impacted, it was a 22 x 17 right long stem with a 36 + 8 neck. We impacted it at about 20 degrees of anteversion.  The 32 + 6 metal head was then placed and hips reduced to the same stability parameters. I obtained an x-ray in the AP and lateral planes distally given the very thin nature of the lateral cortex.  Unfortunately, it was evident that there was a periprosthetic fracture near the tip of the stem along the lateral cortex.  The stem was stable and intact.  I then had to extend the incision distally about another 6 to 8 inches to get down to the level where the tip of the stem was present.  The skin was cut with a 10 blade through subcutaneous tissue to the fascia lata, which was incised in line with the skin incision.  Fascia of the vastus lateralis was incised and the muscle elevated off the femur.  There were 2 butterfly fragments with this fracture.  The stem once again is very stable.  It is not rotating at all.  It remains within the canal.  That area where the lateral cortex is thinned is where the fracture occurred.  I placed 4 Zimmer cables around this area with 1 cable distal to the fracture, just in case there was a split into the cortex distal to the stem.  We did not notice one, but I placed the cable there just in case.  The 3 other cables are placed around the butterfly fragments and this effectively stabilized the entire fracture.  The cables were tightened up and once there was appropriate tension then theywere crimped and cut. The wound was then copiously irrigated with 1 L of saline solution.  The fascia of the vastus lateralis was closed with #1 Vicryl, fascia lata closed with #1 Vicryl over Hemovac drain, subcutaneous closed with  #1 and 2-0 Vicryl, and skin closed with staples.  The drain was hooked to suction.  The incision was cleaned and dried and a bulky sterile dressing applied.  He was then awakened and transported to Recovery in stable condition.     Ollen Gross, M.D.     FA/MEDQ  D:  05/11/2011  T:  05/11/2011  Job:  [id number]

## 2011-05-12 LAB — CBC
MCH: 31.3 pg (ref 26.0–34.0)
MCHC: 34.6 g/dL (ref 30.0–36.0)
MCV: 90.5 fL (ref 78.0–100.0)
Platelets: 134 10*3/uL — ABNORMAL LOW (ref 150–400)
RBC: 2.94 MIL/uL — ABNORMAL LOW (ref 4.22–5.81)
RDW: 13.7 % (ref 11.5–15.5)

## 2011-05-12 LAB — GLUCOSE, CAPILLARY: Glucose-Capillary: 162 mg/dL — ABNORMAL HIGH (ref 70–99)

## 2011-05-12 LAB — BASIC METABOLIC PANEL
CO2: 26 mEq/L (ref 19–32)
Calcium: 8.2 mg/dL — ABNORMAL LOW (ref 8.4–10.5)
Creatinine, Ser: 1.28 mg/dL (ref 0.50–1.35)
GFR calc non Af Amer: 52 mL/min — ABNORMAL LOW (ref >90)

## 2011-05-12 MED ORDER — METFORMIN HCL 500 MG PO TABS
500.0000 mg | ORAL_TABLET | Freq: Every day | ORAL | Status: DC
  Administered 2011-05-13: 500 mg via ORAL
  Filled 2011-05-12 (×2): qty 1

## 2011-05-12 MED ORDER — WARFARIN SODIUM 7.5 MG PO TABS
7.5000 mg | ORAL_TABLET | Freq: Once | ORAL | Status: AC
  Administered 2011-05-12: 7.5 mg via ORAL
  Filled 2011-05-12: qty 1

## 2011-05-12 MED ORDER — COUMADIN BOOK
Freq: Once | Status: AC
  Administered 2011-05-12: 1
  Filled 2011-05-12: qty 1

## 2011-05-12 MED ORDER — WARFARIN VIDEO
Freq: Once | Status: AC
  Administered 2011-05-13: 12:00:00
  Filled 2011-05-12: qty 1

## 2011-05-12 NOTE — Progress Notes (Signed)
Physical Therapy Evaluation Patient Details Name: Preston Carter MRN: [id number] DOB: [birth year] Today's Date: 05/12/2011 1530-1602 eval 2  Problem List:  Patient Active Problem List  Diagnoses  . DIABETES MELLITUS  . EXOGENOUS OBESITY  . OBSTRUCTIVE SLEEP APNEA  . ALLERGIC RHINITIS  . ASTHMA  . PERIPHERAL EDEMA    Past Medical History:  Past Medical History  Diagnosis Date  . Hypertension   . Stroke 05-03-11    TIA- more than 20 yrs ago.  . Asthma   . Shortness of breath 05-03-11    SOB with exertion only  . Diabetes mellitus 05-03-11    Oral meds used-Dabetes x5 yrs-CBG's (145)  . Neuromuscular disorder 05-03-11    Dx. Parkinson's -essential tremors-generally, ambulates with walker  . Arthritis 05-03-11    most joints- Bil. hips/knees replaced in the past  . Skin cancer 05-03-11    Past hx. skin cancer lesions-removed  . DEMENTIA 05-03-11    hx. Alzheimers-tx. Aricept   Past Surgical History:  Past Surgical History  Procedure Date  . Joint replacement 05-03-11    Bil. hip/knee repalcements-now RT. Hip to be revised 05-11-11  . Tonsillectomy   . Uvulopalatopharyngoplasty 05-03-11    x2 . Can.t tolerate cpap machine, doesn't use  . Cholecystectomy   . Eye surgery 05-03-11    lt. eye for lazy eye  . Carpal tunnel release 05-03-11    bilateral    PT Assessment/Plan/Recommendation PT Assessment Clinical Impression Statement: pt will benefit from PT to maximize independence for transition to next venue of care vs home with wife;  PT Recommendation/Assessment: Patient will need skilled PT in the acute care venue PT Problem List: Decreased strength;Decreased range of motion;Decreased activity tolerance;Decreased balance;Decreased mobility;Decreased knowledge of precautions;Pain;Decreased knowledge of use of DME;Decreased cognition Barriers to Discharge: Decreased caregiver support PT Therapy Diagnosis : Difficulty walking;Acute pain;Generalized weakness PT  Plan PT Frequency: 7X/week PT Treatment/Interventions: DME instruction;Gait training;Functional mobility training;Therapeutic exercise;Balance training;Patient/family education PT Recommendation Follow Up Recommendations: Skilled nursing facility Equipment Recommended: Defer to next venue (TBA if home) Pt would have to be at modified independent level for transfers (at the very least) to return home with wife. PT Goals  Acute Rehab PT Goals PT Goal Formulation: With patient Time For Goal Achievement: 2 weeks Pt will go Supine/Side to Sit: with mod assist PT Goal: Supine/Side to Sit - Progress: Progressing toward goal Pt will go Sit to Supine/Side: with mod assist PT Goal: Sit to Supine/Side - Progress: Other (comment) Pt will Transfer Sit to Stand/Stand to Sit: with mod assist PT Transfer Goal: Sit to Stand/Stand to Sit - Progress: Progressing toward goal Pt will Ambulate: 1 - 15 feet;with rolling walker;with +2 total assist (pt=60%) PT Goal: Ambulate - Progress: Progressing toward goal Pt will Perform Home Exercise Program: with supervision, verbal cues required/provided PT Goal: Perform Home Exercise Program - Progress: Progressing toward goal  PT Evaluation Precautions/Restrictions  Precautions Precautions: Posterior Hip Precaution Comments: pt requires constant cues/reminders for THP and TDWB, has hx dementia and per wife decreased to absent immediate/STM Restrictions RLE Weight Bearing: Touchdown weight bearing Prior Functioning  Home Living Lives With: Spouse Receives Help From: Family (spouse) Type of Home: House Home Layout: One level Home Access: Ramped entrance Home Adaptive Equipment: Walker - rolling;Bedside commode/3-in-1;Wheelchair - powered Prior Function Level of Independence: Requires assistive device for independence Comments: pt wife reports pt was amb prior to surgery by leaning (forearms) on RW; limited household amb at best; (bedroom to chair to  bathroom)  Cognition Cognition Arousal/Alertness: Awake/alert Overall Cognitive Status: History of cognitive impairments History of Cognitive Impairment: Appears at baseline functioning Orientation Level: Oriented to place;Oriented to person;Oriented to situation Cognition - Other Comments: decreased STM per wife, this apparent during PT eval Sensation/Coordination   Extremity Assessment RUE Assessment RUE Assessment: Within Functional Limits LUE Assessment LUE Assessment: Within Functional Limits RLE Assessment RLE Assessment:  (limited by pain at knee and hip; ankle WFL) LLE Assessment LLE Assessment:  (grossly 3+/5) Mobility (including Balance) Bed Mobility Supine to Sit: 1: +2 Total assist Supine to Sit Details (indicate cue type and reason): +2 pt=30%; +2 for UB/LB, scooting, THP, safety due to pt size Sitting - Scoot to Edge of Bed: 1: +2 Total assist Sitting - Scoot to Edge of Bed Details (indicate cue type and reason): same as above Transfers Sit to Stand: 1: +2 Total assist;From bed;From elevated surface;With upper extremity assist Sit to Stand Details (indicate cue type and reason): pt=40% +2 for wt shift, THP, TDWB, safety, lines; multimodal cues for THP, TDWB Stand to Sit: 1: +2 Total assist;With armrests;To chair/3-in-1 Stand to Sit Details: same as above Stand Pivot Transfers: 1: +2 Total assist Stand Pivot Transfer Details (indicate cue type and reason): bed to recliner;+2 pt=25-35%; multimodal cues for TDWB, posture, use of UEs, RW position; Ambulation/Gait Ambulation/Gait: No Stairs: No    Exercise  Total Joint Exercises Ankle Circles/Pumps: AROM;Both;10 reps Quad Sets: AROM;Both;5 reps End of Session PT - End of Session Equipment Utilized During Treatment: Gait belt Activity Tolerance: Patient limited by fatigue;Patient limited by pain Patient left: in chair;with family/visitor present;with call bell in reach (on maxi sky pad for return to bed) Nurse  Communication: Mobility status for transfers;Need for lift equipment General Behavior During Session: [facility] for tasks performed Cognition: Impaired, at baseline  [facility] 05/12/2011, 5:20 PM

## 2011-05-12 NOTE — Progress Notes (Signed)
Subjective: 1 Day Post-Op Procedure(s) (LRB): TOTAL HIP REVISION (Right) Patient reports pain as mild and moderate.   Patient seen in rounds with Dr. Lequita Halt. Patient has complaints of pain and soreness.  We will start therapy today.  Briefly discussed findings with  Dr. Lequita Halt.  Objective: Vital signs in last 24 hours: Temp:  [97 F (36.1 C)-98.6 F (37 C)] 98.3 F (36.8 C) (11/22 0500) Pulse Rate:  [56-85] 70  (11/22 0500) Resp:  [10-18] 16  (11/22 0500) BP: (108-138)/(36-72) 110/64 mmHg (11/22 0500) SpO2:  [96 %-100 %] 99 % (11/22 0500) Weight:  [127.007 kg (280 lb)] 280 lb (127.007 kg) (11/21 2000)  Intake/Output from previous day:  Intake/Output Summary (Last 24 hours) at 05/12/11 0716 Last data filed at 05/12/11 0600  Gross per 24 hour  Intake   4460 ml  Output   1385 ml  Net   3075 ml    Intake/Output this shift:    Labs: Results for orders placed during the hospital encounter of 05/11/11  TYPE AND SCREEN      Component Value Range   ABO/RH(D) O NEG     Antibody Screen NEG     Sample Expiration 05/14/2011    GLUCOSE, CAPILLARY      Component Value Range   Glucose-Capillary 120 (*) 70 - 99 (mg/dL)  ABO/RH      Component Value Range   ABO/RH(D) O NEG    GLUCOSE, CAPILLARY      Component Value Range   Glucose-Capillary 156 (*) 70 - 99 (mg/dL)   Comment 1 Documented in Chart     Comment 2 Notify RN    PROTIME-INR      Component Value Range   Prothrombin Time 14.6  11.6 - 15.2 (seconds)   INR 1.12  0.00 - 1.49   CBC      Component Value Range   WBC 8.0  4.0 - 10.5 (K/uL)   RBC 2.94 (*) 4.22 - 5.81 (MIL/uL)   Hemoglobin 9.2 (*) 13.0 - 17.0 (g/dL)   HCT 04.5 (*) 40.9 - 52.0 (%)   MCV 90.5  78.0 - 100.0 (fL)   MCH 31.3  26.0 - 34.0 (pg)   MCHC 34.6  30.0 - 36.0 (g/dL)   RDW 81.1  91.4 - 78.2 (%)   Platelets 134 (*) 150 - 400 (K/uL)  BASIC METABOLIC PANEL      Component Value Range   Sodium 133 (*) 135 - 145 (mEq/L)   Potassium 4.0  3.5 - 5.1  (mEq/L)   Chloride 98  96 - 112 (mEq/L)   CO2 26  19 - 32 (mEq/L)   Glucose, Bld 178 (*) 70 - 99 (mg/dL)   BUN 25 (*) 6 - 23 (mg/dL)   Creatinine, Ser 9.56  0.50 - 1.35 (mg/dL)   Calcium 8.2 (*) 8.4 - 10.5 (mg/dL)   GFR calc non Af Amer 52 (*) >90 (mL/min)   GFR calc Af Amer 61 (*) >90 (mL/min)  GLUCOSE, CAPILLARY      Component Value Range   Glucose-Capillary 170 (*) 70 - 99 (mg/dL)    Exam - Neurovascular intact Sensation intact distally Intact pulses distally Dressing - clean, dry, no drainage Motor function intact - moving foot and toes well on exam.    Assessment/Plan: 1 Day Post-Op Procedure(s) (LRB): TOTAL HIP REVISION (Right)  Past Medical History  Diagnosis Date  . Hypertension   . Stroke 05-03-11    TIA- more than 20 yrs ago.  . Asthma   .  Shortness of breath 05-03-11    SOB with exertion only  . Diabetes mellitus 05-03-11    Oral meds used-Dabetes x5 yrs-CBG's (145)  . Neuromuscular disorder 05-03-11    Dx. Parkinson's -essential tremors-generally, ambulates with walker  . Arthritis 05-03-11    most joints- Bil. hips/knees replaced in the past  . Skin cancer 05-03-11    Past hx. skin cancer lesions-removed  . DEMENTIA 05-03-11    hx. Alzheimers-tx. Aricept    Advance diet Up with therapy TDWB Only  DVT Prophylaxis  Coumadin Protocol TDWB ONLY Begin Therapy Hip Preacutions Keep foley until tomorrow. No vaccines.  PERKINS, ALEXZANDREW 05/12/2011, 7:16 AM

## 2011-05-12 NOTE — Progress Notes (Signed)
ANTICOAGULATION CONSULT NOTE - Follow Up Consult  Pharmacy Consult for Coumadin Indication: VTE prophylaxis s/p right total hip revision  No Known Allergies  Patient Measurements: Height: 5\' 6"  (167.6 cm) Weight: 280 lb (127.007 kg) IBW/kg (Calculated) : 63.8  Adjusted Body Weight:   Vital Signs: Temp: 98.3 F (36.8 C) (11/22 0500) Temp src: Oral (11/22 0500) BP: 110/64 mmHg (11/22 0500) Pulse Rate: 70  (11/22 0500)  Labs:  Basename 05/12/11 0404  HGB 9.2*  HCT 26.6*  PLT 134*  APTT --  LABPROT 14.6  INR 1.12  HEPARINUNFRC --  CREATININE 1.28  CKTOTAL --  CKMB --  TROPONINI --   Estimated Creatinine Clearance: 60.9 ml/min (by C-G formula based on Cr of 1.28).   Medications:  Scheduled:    . acetaminophen  1,000 mg Intravenous Q6H  . aspirin EC  81 mg Oral Daily  . carbidopa-levodopa  1 tablet Oral TID  . ceFAZolin (ANCEF) IV  1 g Intravenous Q6H  . ceFAZolin (ANCEF) IV  2 g Intravenous Once  . docusate sodium  100 mg Oral BID  . donepezil  5 mg Oral QHS  . entacapone  200 mg Oral TID  . fluticasone  1 spray Each Nare Daily  . furosemide  40 mg Oral Daily  . glimepiride  2 mg Oral Daily  . HYDROmorphone      . insulin aspart  0-15 Units Subcutaneous TID WC  . loratadine  10 mg Oral Daily  . niacin  500 mg Oral BID WC  . olmesartan  20 mg Oral Daily  . potassium chloride SA  20 mEq Oral Daily  . simvastatin  40 mg Oral QHS  . temazepam  30 mg Oral QHS  . triamterene-hydrochlorothiazide  1 tablet Oral Daily  . warfarin  7.5 mg Oral Once  . DISCONTD: carbidopa-levodopa-entacapone  1 tablet Oral TID  . DISCONTD: metFORMIN  500 mg Oral Q breakfast    Assessment: 77 yo male s/p R total hip revision started on coumadin 11/21.  INR unchanged as expected after first dose (1.12 today).  Goal of Therapy:  INR 2-3   Plan:  -Coumadin 7.5mg  po x 1 today -Daily PT/INR  Loralee Pacas R 05/12/2011,1:49 PM

## 2011-05-13 LAB — BASIC METABOLIC PANEL
CO2: 28 mEq/L (ref 19–32)
Calcium: 8.2 mg/dL — ABNORMAL LOW (ref 8.4–10.5)
Creatinine, Ser: 1.79 mg/dL — ABNORMAL HIGH (ref 0.50–1.35)
GFR calc non Af Amer: 35 mL/min — ABNORMAL LOW (ref >90)
Glucose, Bld: 150 mg/dL — ABNORMAL HIGH (ref 70–99)

## 2011-05-13 LAB — GLUCOSE, CAPILLARY
Glucose-Capillary: 201 mg/dL — ABNORMAL HIGH (ref 70–99)
Glucose-Capillary: 122 mg/dL — ABNORMAL HIGH (ref 70–99)
Glucose-Capillary: 173 mg/dL — ABNORMAL HIGH (ref 70–99)

## 2011-05-13 LAB — CBC
Hemoglobin: 8.1 g/dL — ABNORMAL LOW (ref 13.0–17.0)
MCH: 31.3 pg (ref 26.0–34.0)
RBC: 2.59 MIL/uL — ABNORMAL LOW (ref 4.22–5.81)

## 2011-05-13 LAB — PROTIME-INR: Prothrombin Time: 16.3 seconds — ABNORMAL HIGH (ref 11.6–15.2)

## 2011-05-13 MED ORDER — FUROSEMIDE 10 MG/ML IJ SOLN
INTRAMUSCULAR | Status: AC
  Administered 2011-05-13: 10 mg via INTRAVENOUS
  Filled 2011-05-13: qty 4

## 2011-05-13 MED ORDER — DIPHENHYDRAMINE HCL 25 MG PO CAPS
25.0000 mg | ORAL_CAPSULE | Freq: Once | ORAL | Status: AC
  Administered 2011-05-13: 25 mg via ORAL
  Filled 2011-05-13: qty 1

## 2011-05-13 MED ORDER — ACETAMINOPHEN 325 MG PO TABS
650.0000 mg | ORAL_TABLET | Freq: Once | ORAL | Status: AC
  Administered 2011-05-13: 650 mg via ORAL
  Filled 2011-05-13: qty 2

## 2011-05-13 MED ORDER — FUROSEMIDE 10 MG/ML IJ SOLN: 10.0000 mg | Freq: Once | INTRAMUSCULAR | Status: DC

## 2011-05-13 MED ORDER — WARFARIN SODIUM 7.5 MG PO TABS
7.5000 mg | ORAL_TABLET | Freq: Once | ORAL | Status: AC
  Administered 2011-05-13: 7.5 mg via ORAL
  Filled 2011-05-13 (×2): qty 1

## 2011-05-13 NOTE — Progress Notes (Signed)
Physical Therapy Treatment Patient Details Name: SKYE RODARTE MRN: [id number] DOB: October 07, 1931 Today's Date: 05/13/2011 1351-1408 1TE PT Assessment/Plan  PT - Assessment/Plan Comments on Treatment Session: Continues to recommend SNF to maximize independence and allow eventual safe return to home setting PT Plan: Discharge plan remains appropriate;Frequency remains appropriate PT Frequency: 7X/week Follow Up Recommendations: Skilled nursing facility Equipment Recommended: Defer to next venue PT Goals  Acute Rehab PT Goals PT Goal: Perform Home Exercise Program - Progress: Progressing toward goal  PT Treatment Precautions/Restrictions  Precautions Precautions: Posterior Hip Precaution Comments: pt requires constant cues/reminders for THP and TDWB, has hx dementia and per wife decreased to absent immediate/STM Restrictions Weight Bearing Restrictions: Yes RLE Weight Bearing: Touchdown weight bearing Mobility (including Balance)      Exercise  Total Joint Exercises Ankle Circles/Pumps: AROM;Both;10 reps Quad Sets: AROM;Right;Supine;10 reps Short Arc QuadBarbaraann Boys;Right;10 reps;Supine Heel Slides: AAROM;Right;10 reps;Supine Hip ABduction/ADduction: AROM;Right;10 reps;Supine End of Session PT - End of Session Activity Tolerance: Patient limited by fatigue;Patient limited by pain Patient left: in bed;with call bell in reach;with family/visitor present Nurse Communication:  (OOB deferred d/t pt getting blood; IVsite in hand;) General Behavior During Session: [facility] for tasks performed Cognition: Impaired, at baseline  [facility] 05/13/2011, 2:14 PM

## 2011-05-13 NOTE — Progress Notes (Signed)
Subjective: 2 Days Post-Op Procedure(s) (LRB): TOTAL HIP REVISION (Right) Patient reports pain as mild.    Patient has no other complaints  Objective: Vital signs in last 24 hours: Temp:  [98.3 F (36.8 C)-99.2 F (37.3 C)] 99.1 F (37.3 C) (11/23 0515) Pulse Rate:  [75-77] 75  (11/23 0515) Resp:  [12-16] 12  (11/23 0515) BP: (107-122)/(50-68) 107/61 mmHg (11/23 0515) SpO2:  [95 %-100 %] 95 % (11/23 0515)  Intake/Output from previous day:  Intake/Output Summary (Last 24 hours) at 05/13/11 0817 Last data filed at 05/13/11 0600  Gross per 24 hour  Intake 2906.67 ml  Output   1510 ml  Net 1396.67 ml    Intake/Output this shift:    Labs: Results for orders placed during the hospital encounter of 05/11/11  TYPE AND SCREEN      Component Value Range   ABO/RH(D) O NEG     Antibody Screen NEG     Sample Expiration 05/14/2011    GLUCOSE, CAPILLARY      Component Value Range   Glucose-Capillary 120 (*) 70 - 99 (mg/dL)  ABO/RH      Component Value Range   ABO/RH(D) O NEG    GLUCOSE, CAPILLARY      Component Value Range   Glucose-Capillary 156 (*) 70 - 99 (mg/dL)   Comment 1 Documented in Chart     Comment 2 Notify RN    PROTIME-INR      Component Value Range   Prothrombin Time 14.6  11.6 - 15.2 (seconds)   INR 1.12  0.00 - 1.49   CBC      Component Value Range   WBC 8.0  4.0 - 10.5 (K/uL)   RBC 2.94 (*) 4.22 - 5.81 (MIL/uL)   Hemoglobin 9.2 (*) 13.0 - 17.0 (g/dL)   HCT 16.1 (*) 09.6 - 52.0 (%)   MCV 90.5  78.0 - 100.0 (fL)   MCH 31.3  26.0 - 34.0 (pg)   MCHC 34.6  30.0 - 36.0 (g/dL)   RDW 04.5  40.9 - 81.1 (%)   Platelets 134 (*) 150 - 400 (K/uL)  BASIC METABOLIC PANEL      Component Value Range   Sodium 133 (*) 135 - 145 (mEq/L)   Potassium 4.0  3.5 - 5.1 (mEq/L)   Chloride 98  96 - 112 (mEq/L)   CO2 26  19 - 32 (mEq/L)   Glucose, Bld 178 (*) 70 - 99 (mg/dL)   BUN 25 (*) 6 - 23 (mg/dL)   Creatinine, Ser 9.14  0.50 - 1.35 (mg/dL)   Calcium 8.2 (*) 8.4 -  10.5 (mg/dL)   GFR calc non Af Amer 52 (*) >90 (mL/min)   GFR calc Af Amer 61 (*) >90 (mL/min)  GLUCOSE, CAPILLARY      Component Value Range   Glucose-Capillary 170 (*) 70 - 99 (mg/dL)  GLUCOSE, CAPILLARY      Component Value Range   Glucose-Capillary 178 (*) 70 - 99 (mg/dL)  GLUCOSE, CAPILLARY      Component Value Range   Glucose-Capillary 172 (*) 70 - 99 (mg/dL)   Comment 1 Notify RN    PROTIME-INR      Component Value Range   Prothrombin Time 16.3 (*) 11.6 - 15.2 (seconds)   INR 1.29  0.00 - 1.49   CBC      Component Value Range   WBC 8.8  4.0 - 10.5 (K/uL)   RBC 2.59 (*) 4.22 - 5.81 (MIL/uL)   Hemoglobin 8.1 (*)  13.0 - 17.0 (g/dL)   HCT 04.5 (*) 40.9 - 52.0 (%)   MCV 90.7  78.0 - 100.0 (fL)   MCH 31.3  26.0 - 34.0 (pg)   MCHC 34.5  30.0 - 36.0 (g/dL)   RDW 81.1  91.4 - 78.2 (%)   Platelets 107 (*) 150 - 400 (K/uL)  BASIC METABOLIC PANEL      Component Value Range   Sodium 132 (*) 135 - 145 (mEq/L)   Potassium 3.3 (*) 3.5 - 5.1 (mEq/L)   Chloride 97  96 - 112 (mEq/L)   CO2 28  19 - 32 (mEq/L)   Glucose, Bld 150 (*) 70 - 99 (mg/dL)   BUN 25 (*) 6 - 23 (mg/dL)   Creatinine, Ser 9.56 (*) 0.50 - 1.35 (mg/dL)   Calcium 8.2 (*) 8.4 - 10.5 (mg/dL)   GFR calc non Af Amer 35 (*) >90 (mL/min)   GFR calc Af Amer 40 (*) >90 (mL/min)  GLUCOSE, CAPILLARY      Component Value Range   Glucose-Capillary 162 (*) 70 - 99 (mg/dL)  GLUCOSE, CAPILLARY      Component Value Range   Glucose-Capillary 196 (*) 70 - 99 (mg/dL)  GLUCOSE, CAPILLARY      Component Value Range   Glucose-Capillary 176 (*) 70 - 99 (mg/dL)    Exam - Neurologically intact Neurovascular intact Intact pulses distally Compartment soft Dressing/Incision - clean, dry, no drainage Motor function intact - moving foot and toes well on exam.  Hemovac discontinued  Assessment/Plan: 2 Days Post-Op Procedure(s) (LRB): TOTAL HIP REVISION (Right)  Past Medical History  Diagnosis Date  . Hypertension   . Stroke  05-03-11    TIA- more than 20 yrs ago.  . Asthma   . Shortness of breath 05-03-11    SOB with exertion only  . Diabetes mellitus 05-03-11    Oral meds used-Dabetes x5 yrs-CBG's (145)  . Neuromuscular disorder 05-03-11    Dx. Parkinson's -essential tremors-generally, ambulates with walker  . Arthritis 05-03-11    most joints- Bil. hips/knees replaced in the past  . Skin cancer 05-03-11    Past hx. skin cancer lesions-removed  . DEMENTIA 05-03-11    hx. Alzheimers-tx. Aricept    Advance diet Up with therapy Discharge to SNF when ready medically Will transfuse 2 units PRBCs for post-op anemia Will require SNF based on PT sessions yesterday. Social work consulted  DVT Prophylaxis -Coumadin Protocol TDWB right Leg  Preston Carter 05/13/2011, 8:17 AM

## 2011-05-13 NOTE — Progress Notes (Signed)
Occupational Therapy Note Chart reviewed. Note patient to receive 2 units of blood. Also see patient was +2 total assist with PT yesterday. Will check back on patient at a later time. Judithann Sauger OTR/L [phone]

## 2011-05-13 NOTE — Progress Notes (Signed)
CM consult done. See CM notes in shadow chart.  Gordie Belvin Wyche RN BSN CCM [phone] 05/13/2011    

## 2011-05-13 NOTE — Progress Notes (Signed)
ANTICOAGULATION CONSULT NOTE - Follow Up Consult  Pharmacy Consult for Coumadin Indication: VTE prophylaxis s/p right total hip revision  No Known Allergies  Patient Measurements: Height: 5\' 6"  (167.6 cm) Weight: 280 lb (127.007 kg) IBW/kg (Calculated) : 63.8  Adjusted Body Weight:   Vital Signs: Temp: 99.1 F (37.3 C) (11/23 0515) Temp src: Oral (11/23 0515) BP: 107/61 mmHg (11/23 0515) Pulse Rate: 75  (11/23 0515)  Labs:  Basename 05/13/11 0355 05/12/11 0404  HGB 8.1* 9.2*  HCT 23.5* 26.6*  PLT 107* 134*  APTT -- --  LABPROT 16.3* 14.6  INR 1.29 1.12  HEPARINUNFRC -- --  CREATININE 1.79* 1.28  CKTOTAL -- --  CKMB -- --  TROPONINI -- --   Estimated Creatinine Clearance: 43.6 ml/min (by C-G formula based on Cr of 1.79).   Medications:  Scheduled:     . acetaminophen  1,000 mg Intravenous Q6H  . acetaminophen  650 mg Oral Once  . aspirin EC  81 mg Oral Daily  . carbidopa-levodopa  1 tablet Oral TID  . coumadin book   Does not apply Once  . diphenhydrAMINE  25 mg Oral Once  . docusate sodium  100 mg Oral BID  . donepezil  5 mg Oral QHS  . entacapone  200 mg Oral TID  . fluticasone  1 spray Each Nare Daily  . furosemide  10 mg Intravenous Once  . furosemide  40 mg Oral Daily  . glimepiride  2 mg Oral Daily  . insulin aspart  0-15 Units Subcutaneous TID WC  . loratadine  10 mg Oral Daily  . metFORMIN  500 mg Oral Q breakfast  . niacin  500 mg Oral BID WC  . olmesartan  20 mg Oral Daily  . potassium chloride SA  20 mEq Oral Daily  . simvastatin  40 mg Oral QHS  . temazepam  30 mg Oral QHS  . triamterene-hydrochlorothiazide  1 tablet Oral Daily  . warfarin  7.5 mg Oral ONCE-1800  . warfarin  7.5 mg Oral ONCE-1800  . warfarin   Does not apply Once    Assessment: 77 yo male s/p R total hip revision started on coumadin 11/21.   Protime slowly moving, 1.29 today after 2 x 7.5mg  coumadin Eating 100% of meals, 280 lb  Goal of Therapy:  INR 2-3   Plan:    -3rd dose of Coumadin 7.5mg  po x 1 today -Daily PT/INR -Needs education, will wait for spouse to teach. -Plan SNF at discharge for rehab  Chilton Si, Esmirna Ravan L 05/13/2011,11:56 AM

## 2011-05-14 ENCOUNTER — Inpatient Hospital Stay (HOSPITAL_COMMUNITY): Payer: Medicare Other

## 2011-05-14 DIAGNOSIS — D696 Thrombocytopenia, unspecified: Secondary | ICD-10-CM | POA: Diagnosis present

## 2011-05-14 DIAGNOSIS — E871 Hypo-osmolality and hyponatremia: Secondary | ICD-10-CM | POA: Diagnosis present

## 2011-05-14 DIAGNOSIS — N179 Acute kidney failure, unspecified: Secondary | ICD-10-CM | POA: Diagnosis present

## 2011-05-14 DIAGNOSIS — R509 Fever, unspecified: Secondary | ICD-10-CM | POA: Diagnosis not present

## 2011-05-14 DIAGNOSIS — D649 Anemia, unspecified: Secondary | ICD-10-CM | POA: Diagnosis present

## 2011-05-14 HISTORY — DX: Anemia, unspecified: D64.9

## 2011-05-14 LAB — TYPE AND SCREEN
ABO/RH(D): O NEG
Antibody Screen: NEGATIVE
Unit division: 0

## 2011-05-14 LAB — URINALYSIS, ROUTINE W REFLEX MICROSCOPIC
Glucose, UA: NEGATIVE mg/dL
Ketones, ur: NEGATIVE mg/dL
Nitrite: NEGATIVE
Protein, ur: 100 mg/dL — AB
Urobilinogen, UA: 0.2 mg/dL (ref 0.0–1.0)

## 2011-05-14 LAB — URINE MICROSCOPIC-ADD ON

## 2011-05-14 LAB — GLUCOSE, CAPILLARY: Glucose-Capillary: 174 mg/dL — ABNORMAL HIGH (ref 70–99)

## 2011-05-14 LAB — BASIC METABOLIC PANEL
Chloride: 95 mEq/L — ABNORMAL LOW (ref 96–112)
GFR calc Af Amer: 37 mL/min — ABNORMAL LOW (ref >90)
Potassium: 3.5 mEq/L (ref 3.5–5.1)
Sodium: 129 mEq/L — ABNORMAL LOW (ref 135–145)

## 2011-05-14 LAB — PROTIME-INR: INR: 1.32 (ref 0.00–1.49)

## 2011-05-14 LAB — PRO B NATRIURETIC PEPTIDE: Pro B Natriuretic peptide (BNP): 1672 pg/mL — ABNORMAL HIGH (ref 0–450)

## 2011-05-14 LAB — CBC
Platelets: 87 10*3/uL — ABNORMAL LOW (ref 150–400)
RBC: 2.86 MIL/uL — ABNORMAL LOW (ref 4.22–5.81)
RDW: 13.9 % (ref 11.5–15.5)
WBC: 10 10*3/uL (ref 4.0–10.5)

## 2011-05-14 MED ORDER — ENOXAPARIN SODIUM 40 MG/0.4ML ~~LOC~~ SOLN
40.0000 mg | SUBCUTANEOUS | Status: DC
  Administered 2011-05-14: 40 mg via SUBCUTANEOUS
  Filled 2011-05-14 (×2): qty 0.4

## 2011-05-14 MED ORDER — WARFARIN SODIUM 7.5 MG PO TABS
7.5000 mg | ORAL_TABLET | Freq: Once | ORAL | Status: AC
  Administered 2011-05-14: 7.5 mg via ORAL
  Filled 2011-05-14: qty 1

## 2011-05-14 MED ORDER — PIPERACILLIN-TAZOBACTAM 3.375 G IVPB
3.3750 g | Freq: Three times a day (TID) | INTRAVENOUS | Status: DC
  Administered 2011-05-14 – 2011-05-15 (×2): 3.375 g via INTRAVENOUS
  Filled 2011-05-14 (×5): qty 50

## 2011-05-14 MED ORDER — DEXTROSE 5 % IV SOLN: 1.0000 g | INTRAVENOUS | Status: DC

## 2011-05-14 MED ORDER — POLYETHYLENE GLYCOL 3350 17 G PO PACK
17.0000 g | PACK | Freq: Two times a day (BID) | ORAL | Status: DC
  Administered 2011-05-14 – 2011-05-15 (×3): 17 g via ORAL
  Administered 2011-05-15: 22:00:00 via ORAL
  Administered 2011-05-16: 17 g via ORAL
  Administered 2011-05-17: 10:00:00 via ORAL
  Administered 2011-05-18: 17 g via ORAL
  Filled 2011-05-14 (×12): qty 1

## 2011-05-14 MED ORDER — DEXTROSE 5 % IV SOLN
1.0000 g | INTRAVENOUS | Status: DC
  Administered 2011-05-14: 1 g via INTRAVENOUS
  Filled 2011-05-14: qty 10

## 2011-05-14 MED ORDER — SODIUM CHLORIDE 0.9 % IV SOLN
INTRAVENOUS | Status: DC
  Administered 2011-05-15: 14:00:00 via INTRAVENOUS
  Filled 2011-05-14 (×2): qty 50

## 2011-05-14 NOTE — Progress Notes (Signed)
Subjective: 3 Days Post-Op Procedure(s) (LRB): TOTAL HIP REVISION (Right) Patient reports pain as 2 on 0-10 scale.   No complaints.    Objective: Vital signs in last 24 hours: Temp:  [98.1 F (36.7 C)-102.1 F (38.9 C)] 100.8 F (38.2 C) (11/24 0651) Pulse Rate:  [64-77] 77  (11/24 0458) Resp:  [16-20] 16  (11/24 0458) BP: (90-125)/(48-72) 123/68 mmHg (11/24 0458) SpO2:  [94 %-100 %] 95 % (11/24 0458)  Intake/Output from previous day: 11/23 0701 - 11/24 0700 In: 1265 [P.O.:540; I.V.:50; Blood:675] Out: 1250 [Urine:1250] Intake/Output this shift:     Basename 05/14/11 0402 05/13/11 0355 05/12/11 0404  HGB 8.8* 8.1* 9.2*    Basename 05/14/11 0402 05/13/11 0355  WBC 10.0 8.8  RBC 2.86* 2.59*  HCT 25.2* 23.5*  PLT 87* 107*    Basename 05/14/11 0402 05/13/11 0355  NA 129* 132*  K 3.5 3.3*  CL 95* 97  CO2 26 28  BUN 28* 25*  CREATININE 1.94* 1.79*  GLUCOSE 187* 150*  CALCIUM 8.0* 8.2*    Basename 05/14/11 0402 05/13/11 0355  LABPT -- --  INR 1.32 1.29   Patient is slightly confused Neurovascular intact Sensation intact distally Dorsiflexion/Plantar flexion intact Incision: no drainage Compartment soft Moderate LE edema swelling right thigh  Assessment/Plan: 3 Days Post-Op Procedure(s) (LRB): TOTAL HIP REVISION (Right) Patient with moderate edema with poor output Hyponatremia, Post operative anemia, increasing CREAT Febrile- encourage IS q 1 hour while awake Daily dressing change Will consult Hospitalist to assist with medical issues will need better fluid management. Coumadin per pharm, will give dose of Lovenox since INR not therapeutic.  Lynda Wanninger R. 05/14/2011, 7:40 AM

## 2011-05-14 NOTE — Consults (Signed)
Hospital Admission Note Date: 05/14/2011  Patient name: Preston Carter Medical record number: [id number] Date of birth: October 04, 1931 Age: 77 y.o. Gender: male PCP: No primary provider on file.  Reason For Consult: Renal failure.   History of Present Illness: This is a very pleasant 77 year old who was admitted on November 21 for an elective right hip revision and replacement. Patient is  Day 3  post op. He has worsening renal function, he had a temperature of 102 on November 23 , hyponatremia, and post operative blood loss. He is sitting in bed in no acute distress, asking for breakfast. He relates some cough on-and-off. Denies shortness of breath or chest pain. Denies nausea vomiting or diarrhea. He has not had a bowel movement since the 21.     Allergies: Review of patient's allergies indicates no known allergies. Past Medical History  Diagnosis Date  . Hypertension   . Stroke 05-03-11    TIA- more than 20 yrs ago.  . Asthma   . Shortness of breath 05-03-11    SOB with exertion only  . Diabetes mellitus 05-03-11    Oral meds used-Dabetes x5 yrs-CBG's (145)  . Neuromuscular disorder 05-03-11    Dx. Parkinson's -essential tremors-generally, ambulates with walker  . Arthritis 05-03-11    most joints- Bil. hips/knees replaced in the past  . Skin cancer 05-03-11    Past hx. skin cancer lesions-removed  . DEMENTIA 05-03-11    hx. Alzheimers-tx. Aricept   Prior to Admission medications   Medication Sig Start Date End Date Taking? Authorizing Provider  carbidopa-levodopa-entacapone (STALEVO) 25-100-200 MG per tablet Take 1 tablet by mouth 3 (three) times daily.    Yes Historical Provider, MD  cholecalciferol (VITAMIN D) 1000 UNITS tablet Take 1,000 Units by mouth daily.     Yes Historical Provider, MD  donepezil (ARICEPT) 5 MG tablet Take 5 mg by mouth at bedtime.    Yes Historical Provider, MD  furosemide (LASIX) 40 MG tablet Take 40 mg by mouth every morning.    Yes Historical Provider,  MD  glimepiride (AMARYL) 2 MG tablet Take 2 mg by mouth daily. Pt takes at 2 pm    Yes Historical Provider, MD  guaiFENesin-dextromethorphan (ROBITUSSIN DM) 100-10 MG/5ML syrup Take 5 mLs by mouth 3 (three) times daily as needed. cough   Yes Historical Provider, MD  HYDROcodone-acetaminophen (NORCO) 5-325 MG per tablet Take 1 tablet by mouth every 6 (six) hours as needed. Pain    Yes Historical Provider, MD  ketoconazole (NIZORAL) 2 % cream Apply 1 application topically daily as needed. itching   Yes Historical Provider, MD  loratadine (CLARITIN) 10 MG tablet Take 10 mg by mouth every morning.    Yes Historical Provider, MD  metFORMIN (GLUCOPHAGE) 500 MG tablet Take 500 mg by mouth every morning.    Yes Historical Provider, MD  mometasone (NASONEX) 50 MCG/ACT nasal spray Place 2 sprays into the nose daily as needed. Allergies     Yes Historical Provider, MD  niacin 500 MG tablet Take 500 mg by mouth 2 (two) times daily with a meal.     Yes Historical Provider, MD  olmesartan (BENICAR) 20 MG tablet Take 20 mg by mouth every morning.    Yes Historical Provider, MD  Olopatadine HCl (PATADAY) 0.2 % SOLN Place 1 drop into both eyes daily as needed. Dry eye   Yes Historical Provider, MD  OVER THE COUNTER MEDICATION Take 1 tablet by mouth 3 (three) times daily. Lithium Aspartate 5  mg    Yes Historical Provider, MD  potassium chloride SA (K-DUR,KLOR-CON) 20 MEQ tablet Take 20 mEq by mouth every morning.    Yes Historical Provider, MD  PRESCRIPTION MEDICATION Inject 0.5 mLs as directed every 7 (seven) days. Allergy shots    Yes Historical Provider, MD  simvastatin (ZOCOR) 40 MG tablet Take 40 mg by mouth at bedtime.    Yes Historical Provider, MD  temazepam (RESTORIL) 30 MG capsule Take 30 mg by mouth at bedtime.    Yes Historical Provider, MD  traMADol (ULTRAM) 50 MG tablet Take 50 mg by mouth every 8 (eight) hours as needed. Pain     Yes Historical Provider, MD  triamterene-hydrochlorothiazide  (MAXZIDE) 75-50 MG per tablet Take 1 tablet by mouth every morning.    Yes Historical Provider, MD  aspirin EC 81 MG tablet Take 81 mg by mouth every morning.      Historical Provider, MD  ipratropium-albuterol (DUONEB) 0.5-2.5 (3) MG/3ML SOLN Take 3 mLs by nebulization every 4 (four) hours as needed. wheezing    Historical Provider, MD  Multiple Vitamins-Minerals (MULTIVITAMINS THER. W/MINERALS) TABS Take 1 tablet by mouth daily.      Historical Provider, MD  multivitamin-lutein [facility]) CAPS Take 1 capsule by mouth daily.      Historical Provider, MD  Omega-3 Fatty Acids (FISH OIL) 1200 MG CAPS Take 1 capsule by mouth 2 (two) times daily.      Historical Provider, MD  vitamin C (ASCORBIC ACID) 500 MG tablet Take 500 mg by mouth daily.     Historical Provider, MD   Past Surgical History  Procedure Date  . Joint replacement 05-03-11    Bil. hip/knee repalcements-now RT. Hip to be revised 05-11-11  . Tonsillectomy   . Uvulopalatopharyngoplasty 05-03-11    x2 . Can.t tolerate cpap machine, doesn't use  . Cholecystectomy   . Eye surgery 05-03-11    lt. eye for lazy eye  . Carpal tunnel release 05-03-11    bilateral    History   Social History  . Marital Status: Married            .           Social History Main Topics  . Smoking status: Former Smoker -- 1.5 packs/day for 0 years  . Smokeless tobacco: Not on file  . Alcohol Use:   . Drug Use:   . Sexually Active: No     Review of Systems: Pertinent items are noted in HPI. Physical Exam: Filed Vitals:   05/13/11 2123 05/14/11 0458 05/14/11 0638 05/14/11 0651  BP: 103/48 123/68    Pulse: 72 77    Temp: 100.9 F (38.3 C) 101.6 F (38.7 C) 102.1 F (38.9 C) 100.8 F (38.2 C)  TempSrc: Oral Oral    Resp: 18 16    Height:      Weight:      SpO2: 99% 95%      Intake/Output Summary (Last 24 hours) at 05/14/11 0907 Last data filed at 05/14/11 0459  Gross per 24 hour  Intake   1265 ml  Output   1250 ml  Net      15 ml   BP 123/68  Pulse 77  Temp(Src) 100.8 F (38.2 C) (Oral)  Resp 16  Ht 5\' 6"  (1.676 m)  Wt 127.007 kg (280 lb)  BMI 45.19 kg/m2  SpO2 95%  General Appearance:    Alert, cooperative, no distress, appears stated age  Head:    Normocephalic,  without obvious abnormality, atraumatic  Eyes:    PERRL, conjunctiva/corneas clear, EOM's intact, fundi    benign, both eyes       Ears:    Normal TM's and external ear canals, both ears  Nose:   Nares normal, septum midline, mucosa normal, no drainage    or sinus tenderness  Throat:   Lips, mucosa, and tongue dry;   Neck:   Supple, symmetrical, trachea midline, no adenopathy;       thyroid:  No enlargement/tenderness/nodules; no carotid   bruit or JVD     Lungs:     Clear to auscultation bilaterally, respirations unlabored     Heart:    Regular rate and rhythm, S1 and S2 normal, no murmur, rub   or gallop  Abdomen:     Soft, non-tender, bowel sounds active all four quadrants,    no masses, no organomegaly, obsed        Extremities:   Left extremity without edema, Right with significant edema up to the thigh.   Pulses:   2+ and symmetric all extremities  Skin:   Skin color, texture, turgor normal, no rashes or lesions     Neurologic:   CNII-XII intact. Normal strength, sensation and reflexes      throughout   Lab results:  [facility] 05/14/11 0402 05/13/11 0355  NA 129* 132*  K 3.5 3.3*  CL 95* 97  CO2 26 28  GLUCOSE 187* 150*  BUN 28* 25*  CREATININE 1.94* 1.79*  CALCIUM 8.0* 8.2*  MG -- --  PHOS -- --   Basename 05/14/11 0402 05/13/11 0355  WBC 10.0 8.8  NEUTROABS -- --  HGB 8.8* 8.1*  HCT 25.2* 23.5*  MCV 88.1 90.7  PLT 87* 107*   Imaging results:  Dg Chest 2 View  05/03/2011  *RADIOLOGY REPORT*  Clinical Data: Preoperative cardiopulmonary evaluation.  CHEST - 2 VIEW  Comparison: 04/23/2010.  Findings: The cardiac silhouette is normal size and shape. Ectasia and nonaneurysmal calcification of the thoracic  aorta are seen. Mediastinal and hilar contours appear stable.  There is slight prominence of the reticular interstitial markings.  This is unchanged.  No acute infiltrative densities are seen.  There is flattening of the diaphragm on lateral image with overall hyperinflation configuration. No pleural abnormality is evident. Minimal degenerative disc disease and degenerative spondylosis changes are seen.  IMPRESSION: Overall hyperinflation configuration.  Chronic increase in prominence of reticular interstitial markings.  No consolidation, pleural effusion or other acute process.  Original Report Authenticated By: Crawford Givens, M.D.   Ap Pelvis Hip  05/11/2011  *RADIOLOGY REPORT*  Clinical Data: Postop ORIF right femur fracture in patient with right hip prosthesis.  PELVIS - 1-2 VIEW 05/11/2011:  Comparison: Portable right femur x-rays earlier same date.  Findings: Interval ORIF of the mid shaft femur fracture with cerclage wires.  Femur incompletely imaged, but anatomic alignment of the visualized femur in the AP projection .  Surgical drain in place.  IMPRESSION: ORIF of the femur fracture with cerclage wires.  Femur incompletely imaged, but anatomic alignment in the AP projection of the visualized femur.  Original Report Authenticated By: Arnell Sieving, M.D.   Dg Hip Complete Right  05/03/2011  *RADIOLOGY REPORT*  Clinical Data: Preop total hip replacement.  RIGHT HIP - COMPLETE 2+ VIEW  Comparison: None.  Findings: Bilateral total hip prosthesis are present, with the patient scheduled for removal on the right.  There is considerable loosening of the femoral component. Bones of the  pelvis are grossly unremarkable.  IMPRESSION:  Preop right total hip replacement/revision as described.  Original Report Authenticated By: Elsie Stain, M.D.   Dg Femur Right  05/11/2011  *RADIOLOGY REPORT*  Clinical Data: Intraoperative right hip replacement  RIGHT FEMUR - 2 VIEW  Comparison: 05/03/2011  Findings:  Interval revision of prior right total hip arthroplasty.  Fracture of the anterolateral mid right femoral shaft.  As a result, the distal femoral stem is eccentrically positioned in the femoral shaft.  Prior total knee arthroplasty.  IMPRESSION: Fracture of the anterolateral mid right femoral shaft.  Revision of right total hip arthroplasty.  Distal femoral stem is eccentrically positioned.  Original Report Authenticated By: Charline Bills, M.D.   Other results: EKG:   Patient Active Hospital Problem   1- Acute Renal Failure:  Patient has good urine output, he has 1.2 L of urine output reported on November 23, weight 127 on November 20. From Nov 23 to Nov 24 patient fluid balance was reported to be 950 negative. He was 1.3 L positive on November 22 to 23.  I think that his acute renal failure could be prerenal, or component of ATN. I will hold nephrotoxic, will discontinued lasix, triamterene, HCTZ, and Olmesartan. I will discontinue potassium supplement. I will order : UA, Urine sodium and creatinine, K. Will consider IV Fluids if chest x ray doesn't show Pulmonary edema.  Will consider renal US if not improvement on renal function. I would like to keep systolic blood pressure above 161.  2-Anemia/ Post operative blood loss: Probably hematoma post operative. Patient received 2 PRBC. Monitor H and H closely now patient is on Lovenox and coumadin. I will discontinue aspirin.  3-Hyponatremia: Probably secondary to renal failure, will check urine osmolality to evaluate for SIADH. Will consider IV fluid if Chest xray negative for pulmonary edema. Depending on urine osmolality results will consider work up for SIADH.   4-Fever: Will do work up for infection: Chest Xray, blood culture if tempt more than 101, UA, urine culture. Will start antibiotics depending on work up result.   5-Thrombocytopenia: Platelet at 134 on Nov 22. Decrease platelet could be secondary to increase consumption secondary to  bleed. He has not received heparin or Lovenox, less likely HIT. I will discontinue aspirin. Work up for infection. I will check peripheral smear.   6-Diabetes: I will discontinue metformin due to worsening renal function. Continue with Glimepiride.  7-Hypertension: I will discontinue BP medication SBP in the 120. Avoid hypotension in setting of possible infection and acute renal failure.  Thank you for Consult will follow.  Lakin Romer M.D. [facility] [phone] 05/14/2011, 9:07 AM

## 2011-05-14 NOTE — Progress Notes (Signed)
Physical Therapy Treatment Patient Details Name: Preston Carter MRN: [id number] DOB: 07/04/1931 Today's Date: 05/14/2011 [id number] 2TA Co-tx with OT  PT Assessment/Pl  Pt needs social work to consult with wife on placement/insurance issues. Pt wife has concerns about co-payments Comments on Treatment Session: decreased pt effort today, pt not OOB yesterday, continue to recommend SNF placement PT Plan: Discharge plan remains appropriate;Frequency remains appropriate PT Frequency: 7X/week Follow Up Recommendations: Skilled nursing facility Equipment Recommended: Defer to next venue PT Goals  Acute Rehab PT Goals PT Goal: Supine/Side to Sit - Progress:  (progressing slowly d/t multiple co morbidities, med issues) PT Transfer Goal: Sit to Stand/Stand to Sit - Progress:  (same as above) PT Goal: Perform Home Exercise Program - Progress: Progressing toward goal  PT Treatment Precautions/Restrictions  Precautions Precautions: Posterior Hip Precaution Comments: pt requires constant cues/reminders for THP and TDWB, has hx dementia and per wife decreased to absent immediate/STM Required Braces or Orthoses: No Restrictions Weight Bearing Restrictions: Yes RLE Weight Bearing: Touchdown weight bearing Mobility (including Balance) Bed Mobility Supine to Sit: 1: +3 Total assist;HOB elevated (Comment degrees) Supine to Sit Details (indicate cue type and reason): cues for scooting; use of LLE; +3 for UB/LB assist and safety pt=10% Sitting - Scoot to Edge of Bed: 1: +2 Total assist Sitting - Scoot to Edge of Bed Details (indicate cue type and reason): +2 for scooting, balance, safety d/t pt size pt=10% Transfers Transfers: Yes Sit to Stand: 1: +3 Total assist;From bed;From elevated surface;With upper extremity assist Sit to Stand Details (indicate cue type and reason): 2 attempts to stand, pt unable to come to full stand; pt =10% (+3 for wt shift, TDWB, safety, lines) Stand to Sit: 1: +2 Total  assist;With armrests;To chair/3-in-1 Stand to Sit Details: Pt=10%; +3 for wt shift, THP, TDWB, safety Stand Pivot Transfers:  (pt unable to come to full stand for pivot transfer with +3 Assist) Stand Pivot Transfer Details (indicate cue type and reason):  (used maxi sky from EOB to chair d/t decreased pt effort today) Ambulation/Gait Ambulation/Gait: No/unable; pt unable to maintain TDWB Stairs: No    Exercise  Total Joint Exercises Ankle Circles/Pumps: AAROM;Both;10 reps Quad Sets: AROM;Right;5 reps Heel Slides: AAROM;Right;10 reps;Supine End of Session PT - End of Session Equipment Utilized During Treatment: Gait belt (maxisky lift pad) Activity Tolerance: Patient limited by fatigue Patient left: in chair;with call bell in reach;with family/visitor present Nurse Communication: Mobility status for transfers;Need for lift equipment General Behavior During Session: [facility] for tasks performed Cognition: Impaired, at baseline  [facility] 05/14/2011, 3:17 PM

## 2011-05-14 NOTE — Progress Notes (Signed)
Occupational Therapy Evaluation Patient Details Name: Preston Carter MRN: [id number] DOB: [birth year] Today's Date: 05/14/2011 14:22-14:50 Co-session with PT Delice Bison) and tech Alfonso Patten)  Problem List:  Patient Active Problem List  Diagnoses  . DIABETES MELLITUS  . EXOGENOUS OBESITY  . OBSTRUCTIVE SLEEP APNEA  . ALLERGIC RHINITIS  . ASTHMA  . PERIPHERAL EDEMA  . Acute renal failure  . Hyponatremia  . Thrombocytopenia  . Fever  . Anemia    Past Medical History:  Past Medical History  Diagnosis Date  . Hypertension   . Stroke 05-03-11    TIA- more than 20 yrs ago.  . Asthma   . Shortness of breath 05-03-11    SOB with exertion only  . Diabetes mellitus 05-03-11    Oral meds used-Dabetes x5 yrs-CBG's (145)  . Neuromuscular disorder 05-03-11    Dx. Parkinson's -essential tremors-generally, ambulates with walker  . Arthritis 05-03-11    most joints- Bil. hips/knees replaced in the past  . Skin cancer 05-03-11    Past hx. skin cancer lesions-removed  . DEMENTIA 05-03-11    hx. Alzheimers-tx. Aricept   Past Surgical History:  Past Surgical History  Procedure Date  . Joint replacement 05-03-11    Bil. hip/knee repalcements-now RT. Hip to be revised 05-11-11  . Tonsillectomy   . Uvulopalatopharyngoplasty 05-03-11    x2 . Can.t tolerate cpap machine, doesn't use  . Cholecystectomy   . Eye surgery 05-03-11    lt. eye for lazy eye  . Carpal tunnel release 05-03-11    bilateral    OT Assessment/Plan/Recommendation OT Assessment Clinical Impression Statement: This 77 yo male presents s/p RTH revision, thus affecting pt's PLOF with neede A for LB BADLs. Due to deficts below and new hip precautions will benefit from acute OT with follow up at SNF to get to a min A-S level. OT Recommendation/Assessment: Patient will need skilled OT in the acute care venue OT Problem List: Decreased strength;Impaired balance (sitting and/or standing);Decreased cognition;Decreased knowledge of  use of DME or AE;Decreased knowledge of precautions Barriers to Discharge: Decreased caregiver support OT Therapy Diagnosis : Generalized weakness;Cognitive deficits OT Plan OT Frequency: Min 1X/week OT Treatment/Interventions: Self-care/ADL training;DME and/or AE instruction;Therapeutic activities;Patient/family education;Balance training OT Recommendation Follow Up Recommendations: Skilled nursing facility Equipment Recommended: Defer to next venue Individuals Consulted Consulted and Agree with Results and Recommendations: Patient OT Goals Acute Rehab OT Goals OT Goal Formulation: With patient Time For Goal Achievement: 2 weeks ADL Goals Pt Will Perform Upper Body Bathing: with mod assist;Sitting, edge of bed;Unsupported ADL Goal: Upper Body Bathing - Progress: Progressing toward goals Pt Will Perform Lower Body Bathing: with max assist;Sit to stand from bed;Other (comment) (with total A +2 pt=50% for sit to stand from raised bed) ADL Goal: Lower Body Bathing - Progress: Progressing toward goals Pt Will Transfer to Toilet: with 2+ total assist;Squat pivot transfer;Extra wide 3-in-1;Drop arm 3-in-1;Maintaining hip precautions;Maintaining weight bearing status;Other (comment) (pt=50%) ADL Goal: Toilet Transfer - Progress: Progressing toward goals Arm Goals Additional Arm Goal #1: Pt will be S with BilUE theraband level 1 exercises to increases maintain and strength Arm Goal: Additional Goal #1 - Progress: Progressing toward goals  OT Evaluation Precautions/Restrictions  Precautions Precautions: Posterior Hip Precaution Comments: pt requires constant cues/reminders for THP and TDWB, has hx dementia and per wife decreased to absent immediate/STM Required Braces or Orthoses: No Restrictions Weight Bearing Restrictions: Yes RLE Weight Bearing: Touchdown weight bearing Prior Functioning Home Living Lives With: Spouse Receives Help From: Family Type of  Home: House Home Layout: One  level Home Access: Ramped entrance Additional Comments: Pt to go to SNF before D/C'ing home Prior Function Level of Independence: Needs assistance with ADLs;Needs assistance with homemaking Bath: Moderate Dressing: Moderate Meal Prep: Total Light Housekeeping: Total Driving: No Vocation: Retired ADL ADL Eating/Feeding: Simulated;Set up Eating/Feeding Details (indicate cue type and reason): Pt unable to open packets due to edematous hands and essential tremors due to Parkinson's Where Assessed - Eating/Feeding: Chair Grooming: Performed;Wash/dry face;Set up Grooming Details (indicate cue type and reason): Pt unable to set himself up Where Assessed - Grooming: Sitting, chair;Supported Location manager Bathing: Simulated;Maximal assistance Upper Body Bathing Details (indicate cue type and reason): Pt unable to set himself up and due to body habitus and new hip precautions needs additional A Where Assessed - Upper Body Bathing: Supported;Sitting, chair Lower Body Bathing: Simulated;Other (comment) Lower Body Bathing Details (indicate cue type and reason): Pt unable to set himself up and due to body habitus and new hip precautions needs additional A, Pt unable to stand at this time with +3 A (only able to barely get buttocks off of bed)--Pt=10% with +2 to attempt to stand and 3rd person to A with TDWB on RLE--pt unable even with A Where Assessed - Lower Body Bathing: Sit to stand from bed;Other (comment) (minimal partial stand) Upper Body Dressing: +1 Total assistance Upper Body Dressing Details (indicate cue type and reason): Pt unable to set himself up and due to body habitus and new hip precautions needs additional A Where Assessed - Upper Body Dressing: Sitting, chair;Supported Lower Body Dressing: Simulated;+2 Total assistance Lower Body Dressing Details (indicate cue type and reason): Pt unable to set himself up and due to body habitus and new hip precautions needs additional A, Pt unable to  stand at this time with +3 A (only able to barely get buttocks off of bed)--Pt=10% with +2 to attempt to stand and 3rd person to A with TDWB on RLE--pt unable even with A Where Assessed - Lower Body Dressing: Sit to stand from bed (minimal partial stand) Toilet Transfer: Not assessed Toilet Transfer Method: Not assessed Toileting - Clothing Manipulation: Not assessed Toileting - Hygiene: Not assessed Where Assessed - Toileting Hygiene: Not assessed Tub/Shower Transfer: Not assessed Tub/Shower Transfer Method: Not assessed Equipment Used: Rolling walker;Other (comment) (maxi sky) Vision/Perception    Cognition Cognition Arousal/Alertness: Awake/alert Overall Cognitive Status: History of cognitive impairments History of Cognitive Impairment: Appears at baseline functioning Cognition - Other Comments: Decreased STM per wife Sensation/Coordination   Extremity Assessment RUE Assessment RUE Assessment: Exceptions to [facility] RUE Strength RUE Overall Strength Comments: grossly 3/5, slowly, edematous  LUE Assessment LUE Assessment: Exceptions to [facility] LUE Strength LUE Overall Strength Comments: grossly 3/5, slowly, edematous Mobility  Bed Mobility Supine to Sit: 1: +2 Total assist;HOB elevated (Comment degrees) Supine to Sit Details (indicate cue type and reason): cues for scooting; use of LLE; +3 for UB/LB assist and safety Sitting - Scoot to Edge of Bed: 1: +2 Total assist Sitting - Scoot to Edge of Bed Details (indicate cue type and reason): +2 for scooting, balance, safety d/t pt size Transfers Sit to Stand: 1: +2 Total assist;From bed;From elevated surface;With upper extremity assist Sit to Stand Details (indicate cue type and reason): 2 attempts to stand, pt unable to come to full stand; pt =10% (+2 for wt shift, TDWB, safety, lines) Stand to Sit: 1: +2 Total assist;With armrests;To chair/3-in-1 Stand to Sit Details: Pt=10%; +3 for wt shift, THP, TDWB, safety Exercises  Total Joint  Exercises Ankle Circles/Pumps: AAROM;Both;10 reps Quad Sets: AROM;Right;5 reps Heel Slides: AAROM;Right;10 reps;Supine End of Session OT - End of Session Equipment Utilized During Treatment: Gait belt;Other (comment) (RW) Activity Tolerance: Patient limited by fatigue;Other (comment) (limited by decreased ability to A) Patient left: in chair;with call bell in reach;with family/visitor present;Other (comment) (wife and great grand-daughter) Nurse Communication: Need for lift equipment;Other (comment) (Maxy sky pad left under pt.) General Behavior During Session: [facility] for tasks performed Cognition: Impaired, at baseline   Evette Georges, OTR/L [phone]  05/14/2011, 3:26 PM

## 2011-05-14 NOTE — Progress Notes (Signed)
ANTIBIOTIC CONSULT NOTE - INITIAL  Pharmacy Consult for Zosyn Indication: r/o UTI  No Known Allergies  Patient Measurements: Height: 5\' 6"  (167.6 cm) Weight: 280 lb (127.007 kg) IBW/kg (Calculated) : 63.8  Adjusted Body Weight:   Vital Signs: Temp: 100 F (37.8 C) (11/24 1505) Temp src: Oral (11/24 1340) BP: 110/57 mmHg (11/24 1340) Pulse Rate: 79  (11/24 1340) Intake/Output from previous day: 11/23 0701 - 11/24 0700 In: 1265 [P.O.:540; I.V.:50; Blood:675] Out: 1250 [Urine:1250] Intake/Output from this shift:    Labs:  Basename 05/14/11 1000 05/14/11 0402 05/13/11 0355 05/12/11 0404  WBC -- 10.0 8.8 8.0  HGB -- 8.8* 8.1* 9.2*  PLT -- 87* 107* 134*  LABCREA 163.6 -- -- --  CREATININE -- 1.94* 1.79* 1.28   Estimated Creatinine Clearance: 40.2 ml/min (by C-G formula based on Cr of 1.94). No results found for this basename: VANCOTROUGH:2,VANCOPEAK:2,VANCORANDOM:2,GENTTROUGH:2,GENTPEAK:2,GENTRANDOM:2,TOBRATROUGH:2,TOBRAPEAK:2,TOBRARND:2,AMIKACINPEAK:2,AMIKACINTROU:2,AMIKACIN:2, in the last 72 hours   Microbiology: Recent Results (from the past 720 hour(s))  SURGICAL PCR SCREEN     Status: Abnormal   Collection Time   05/03/11  4:12 PM      Component Value Range Status Comment   MRSA, PCR NEGATIVE  NEGATIVE  Final    Staphylococcus aureus POSITIVE (*) NEGATIVE  Final     Medical History: Past Medical History  Diagnosis Date  . Hypertension   . Stroke 05-03-11    TIA- more than 20 yrs ago.  . Asthma   . Shortness of breath 05-03-11    SOB with exertion only  . Diabetes mellitus 05-03-11    Oral meds used-Dabetes x5 yrs-CBG's (145)  . Neuromuscular disorder 05-03-11    Dx. Parkinson's -essential tremors-generally, ambulates with walker  . Arthritis 05-03-11    most joints- Bil. hips/knees replaced in the past  . Skin cancer 05-03-11    Past hx. skin cancer lesions-removed  . DEMENTIA 05-03-11    hx. Alzheimers-tx. Aricept    Medications:  Scheduled:     . carbidopa-levodopa  1 tablet Oral TID  . docusate sodium  100 mg Oral BID  . donepezil  5 mg Oral QHS  . enoxaparin (LOVENOX) injection  40 mg Subcutaneous Q24H  . entacapone  200 mg Oral TID  . fluticasone  1 spray Each Nare Daily  . glimepiride  2 mg Oral Daily  . insulin aspart  0-15 Units Subcutaneous TID WC  . loratadine  10 mg Oral Daily  . niacin  500 mg Oral BID WC  . polyethylene glycol  17 g Oral BID  . simvastatin  40 mg Oral QHS  . sodium chloride 0.9 % 50 mL with cefTRIAXone (ROCEPHIN) 1 g infusion   Intravenous Q24H  . temazepam  30 mg Oral QHS  . warfarin  7.5 mg Oral ONCE-1800  . warfarin  7.5 mg Oral ONCE-1800  . DISCONTD: aspirin EC  81 mg Oral Daily  . DISCONTD: cefTRIAXone (ROCEPHIN) IV  1 g Intravenous Q24H  . DISCONTD: cefTRIAXone (ROCEPHIN) IV  1 g Intravenous Q24H  . DISCONTD: furosemide  10 mg Intravenous Once  . DISCONTD: furosemide  40 mg Oral Daily  . DISCONTD: metFORMIN  500 mg Oral Q breakfast  . DISCONTD: olmesartan  20 mg Oral Daily  . DISCONTD: potassium chloride SA  20 mEq Oral Daily  . DISCONTD: triamterene-hydrochlorothiazide  1 tablet Oral Daily   Assessment: 77 yo male s/p hip replacement 11/21 now with acute renal failure, anemia, hyponatremia, thrombocytopenia, and fever.  Beginning broad spectrum antibiotics with zosyn  for empiric coverage of UTI, sepsis.  Blood & urine cultures pending.  Goal of Therapy:  Broad spectrum antimicrobial coverage  Plan:  -Standard dose Zosyn 3.375gm IV q8h (4hr infusions) -Monitor renal function carefully and adjust dose as needed -Follow up culture data   Loralee Pacas R 05/14/2011,7:18 PM

## 2011-05-14 NOTE — Progress Notes (Signed)
ANTICOAGULATION CONSULT NOTE - Follow Up Consult  Pharmacy Consult for Coumadin Indication: VTE prophylaxis s/p right total hip revision  No Known Allergies  Patient Measurements: Height: 5\' 6"  (167.6 cm) Weight: 280 lb (127.007 kg) IBW/kg (Calculated) : 63.8  Adjusted Body Weight:   Vital Signs: Temp: 98.8 F (37.1 C) (11/24 0959) Temp src: Oral (11/24 0458) BP: 123/68 mmHg (11/24 0458) Pulse Rate: 77  (11/24 0458)  Labs:  Basename 05/14/11 0402 05/13/11 0355 05/12/11 0404  HGB 8.8* 8.1* --  HCT 25.2* 23.5* 26.6*  PLT 87* 107* 134*  APTT -- -- --  LABPROT 16.6* 16.3* 14.6  INR 1.32 1.29 1.12  HEPARINUNFRC -- -- --  CREATININE 1.94* 1.79* 1.28  CKTOTAL -- -- --  CKMB -- -- --  TROPONINI -- -- --   Estimated Creatinine Clearance: 40.2 ml/min (by C-G formula based on Cr of 1.94).   Medications:  Scheduled:     . acetaminophen  650 mg Oral Once  . carbidopa-levodopa  1 tablet Oral TID  . diphenhydrAMINE  25 mg Oral Once  . docusate sodium  100 mg Oral BID  . donepezil  5 mg Oral QHS  . enoxaparin (LOVENOX) injection  40 mg Subcutaneous Q24H  . entacapone  200 mg Oral TID  . fluticasone  1 spray Each Nare Daily  . furosemide      . glimepiride  2 mg Oral Daily  . insulin aspart  0-15 Units Subcutaneous TID WC  . loratadine  10 mg Oral Daily  . niacin  500 mg Oral BID WC  . polyethylene glycol  17 g Oral BID  . simvastatin  40 mg Oral QHS  . temazepam  30 mg Oral QHS  . warfarin  7.5 mg Oral ONCE-1800  . warfarin   Does not apply Once  . DISCONTD: aspirin EC  81 mg Oral Daily  . DISCONTD: furosemide  10 mg Intravenous Once  . DISCONTD: furosemide  40 mg Oral Daily  . DISCONTD: metFORMIN  500 mg Oral Q breakfast  . DISCONTD: olmesartan  20 mg Oral Daily  . DISCONTD: potassium chloride SA  20 mEq Oral Daily  . DISCONTD: triamterene-hydrochlorothiazide  1 tablet Oral Daily    Assessment: 77 yo male s/p R total hip revision started on coumadin 11/21.     Protime slowly moving, 1.32 today after 3 x 7.5mg  coumadin Eating 100% of meals, 280 lb  Goal of Therapy:  INR 2-3   Plan:  -4th dose of Coumadin 7.5mg  po x 1 today -Daily PT/INR -Needs education, will wait for spouse to teach. -Plan SNF at discharge for rehab  Preston Carter, Preston Carter 05/14/2011,10:47 AM

## 2011-05-15 ENCOUNTER — Inpatient Hospital Stay (HOSPITAL_COMMUNITY): Payer: Medicare Other

## 2011-05-15 LAB — RENAL FUNCTION PANEL
Albumin: 2.2 g/dL — ABNORMAL LOW (ref 3.5–5.2)
BUN: 36 mg/dL — ABNORMAL HIGH (ref 6–23)
Calcium: 8.1 mg/dL — ABNORMAL LOW (ref 8.4–10.5)
Phosphorus: 2.7 mg/dL (ref 2.3–4.6)
Potassium: 3.6 mEq/L (ref 3.5–5.1)
Sodium: 131 mEq/L — ABNORMAL LOW (ref 135–145)

## 2011-05-15 LAB — GLUCOSE, CAPILLARY
Glucose-Capillary: 113 mg/dL — ABNORMAL HIGH (ref 70–99)
Glucose-Capillary: 165 mg/dL — ABNORMAL HIGH (ref 70–99)

## 2011-05-15 LAB — HEPATIC FUNCTION PANEL
Alkaline Phosphatase: 46 U/L (ref 39–117)
Bilirubin, Direct: 0.2 mg/dL (ref 0.0–0.3)
Indirect Bilirubin: 0.3 mg/dL (ref 0.3–0.9)
Total Protein: 5.5 g/dL — ABNORMAL LOW (ref 6.0–8.3)

## 2011-05-15 LAB — PROTIME-INR
INR: 1.52 — ABNORMAL HIGH (ref 0.00–1.49)
Prothrombin Time: 18.6 seconds — ABNORMAL HIGH (ref 11.6–15.2)

## 2011-05-15 LAB — BLOOD GAS, ARTERIAL
Bicarbonate: 25 mEq/L — ABNORMAL HIGH (ref 20.0–24.0)
Drawn by: 257701
O2 Content: 2 L/min
pCO2 arterial: 40 mmHg (ref 35.0–45.0)
pH, Arterial: 7.412 (ref 7.350–7.450)
pO2, Arterial: 65.6 mmHg — ABNORMAL LOW (ref 80.0–100.0)

## 2011-05-15 LAB — URINE CULTURE
Colony Count: NO GROWTH
Culture  Setup Time: 201211241310
Culture: NO GROWTH

## 2011-05-15 LAB — CBC
HCT: 23.6 % — ABNORMAL LOW (ref 39.0–52.0)
MCH: 31.2 pg (ref 26.0–34.0)
MCHC: 35.2 g/dL (ref 30.0–36.0)
RDW: 13.9 % (ref 11.5–15.5)

## 2011-05-15 LAB — HEMOGLOBIN AND HEMATOCRIT, BLOOD: Hemoglobin: 8.1 g/dL — ABNORMAL LOW (ref 13.0–17.0)

## 2011-05-15 MED ORDER — WARFARIN SODIUM 7.5 MG PO TABS
7.5000 mg | ORAL_TABLET | Freq: Once | ORAL | Status: AC
  Administered 2011-05-15: 7.5 mg via ORAL
  Filled 2011-05-15: qty 1

## 2011-05-15 MED ORDER — BIOTENE DRY MOUTH MT LIQD: 15.0000 mL | Freq: Two times a day (BID) | OROMUCOSAL | Status: DC

## 2011-05-15 MED ORDER — PIPERACILLIN-TAZOBACTAM IN DEX 2-0.25 GM/50ML IV SOLN
2.2500 g | Freq: Three times a day (TID) | INTRAVENOUS | Status: DC
  Administered 2011-05-15 – 2011-05-16 (×3): 2.25 g via INTRAVENOUS
  Filled 2011-05-15 (×5): qty 50

## 2011-05-15 MED ORDER — ACETAMINOPHEN 325 MG PO TABS
650.0000 mg | ORAL_TABLET | Freq: Once | ORAL | Status: AC
  Administered 2011-05-15: 650 mg via ORAL
  Filled 2011-05-15 (×2): qty 1

## 2011-05-15 MED ORDER — INSULIN ASPART 100 UNIT/ML ~~LOC~~ SOLN: 0.0000 [IU] | Freq: Four times a day (QID) | SUBCUTANEOUS | Status: DC

## 2011-05-15 MED ORDER — INSULIN ASPART 100 UNIT/ML ~~LOC~~ SOLN: 0.0000 [IU] | SUBCUTANEOUS | Status: DC

## 2011-05-15 MED ORDER — FUROSEMIDE 10 MG/ML IJ SOLN: 20.0000 mg | Freq: Once | INTRAMUSCULAR | Status: DC

## 2011-05-15 MED ORDER — PIPERACILLIN-TAZOBACTAM IN DEX 2-0.25 GM/50ML IV SOLN
2.2500 g | Freq: Three times a day (TID) | INTRAVENOUS | Status: DC
  Filled 2011-05-15 (×2): qty 50

## 2011-05-15 MED ORDER — BIOTENE DRY MOUTH MT LIQD
15.0000 mL | Freq: Two times a day (BID) | OROMUCOSAL | Status: DC
  Administered 2011-05-15 – 2011-05-19 (×9): 15 mL via OROMUCOSAL

## 2011-05-15 NOTE — Progress Notes (Signed)
Clinical Social Work received consult for patient to be referred for ST SNF post op.  Patient wife and patient agreeable.  Full assessment in shadow chart along with FL2 to be signed.  Will continue to follow for dc needs and dispo remains SNF.  Preston Carter is patient first choice.  Ashley Jacobs, MSW LCSWA [phone]

## 2011-05-15 NOTE — Progress Notes (Signed)
Subjective: 4 Days Post-Op Procedure(s) (LRB): TOTAL HIP REVISION (Right) Patient reports pain as mild.   Patient has no c/o SOB, abd discomfort or other constitutional symptoms  Objective: Vital signs in last 24 hours: Temp:  [98.4 F (36.9 C)-102.6 F (39.2 C)] 100.7 F (38.2 C) (11/25 0520) Pulse Rate:  [72-79] 72  (11/25 0520) Resp:  [16-20] 20  (11/25 0520) BP: (102-110)/(46-62) 104/46 mmHg (11/25 0520) SpO2:  [92 %-100 %] 97 % (11/25 0520)  Intake/Output from previous day:  Intake/Output Summary (Last 24 hours) at 05/15/11 0825 Last data filed at 05/15/11 0500  Gross per 24 hour  Intake    360 ml  Output    775 ml  Net   -415 ml    Intake/Output this shift:    Labs: Results for orders placed during the hospital encounter of 05/11/11  TYPE AND SCREEN      Component Value Range   ABO/RH(D) O NEG     Antibody Screen NEG     Sample Expiration 05/14/2011     Unit Number [id number]     Blood Component Type RBC LR PHER1     Unit division 00     Status of Unit ISSUED,FINAL     Transfusion Status OK TO TRANSFUSE     Crossmatch Result Compatible     Unit Number [id number]     Blood Component Type RBC LR PHER1     Unit division 00     Status of Unit ISSUED,FINAL     Transfusion Status OK TO TRANSFUSE     Crossmatch Result Compatible    GLUCOSE, CAPILLARY      Component Value Range   Glucose-Capillary 120 (*) 70 - 99 (mg/dL)  ABO/RH      Component Value Range   ABO/RH(D) O NEG    GLUCOSE, CAPILLARY      Component Value Range   Glucose-Capillary 156 (*) 70 - 99 (mg/dL)   Comment 1 Documented in Chart     Comment 2 Notify RN    PROTIME-INR      Component Value Range   Prothrombin Time 14.6  11.6 - 15.2 (seconds)   INR 1.12  0.00 - 1.49   CBC      Component Value Range   WBC 8.0  4.0 - 10.5 (K/uL)   RBC 2.94 (*) 4.22 - 5.81 (MIL/uL)   Hemoglobin 9.2 (*) 13.0 - 17.0 (g/dL)   HCT 78.2 (*) 95.6 - 52.0 (%)   MCV 90.5  78.0 - 100.0 (fL)   MCH 31.3  26.0 - 34.0  (pg)   MCHC 34.6  30.0 - 36.0 (g/dL)   RDW 21.3  08.6 - 57.8 (%)   Platelets 134 (*) 150 - 400 (K/uL)  BASIC METABOLIC PANEL      Component Value Range   Sodium 133 (*) 135 - 145 (mEq/L)   Potassium 4.0  3.5 - 5.1 (mEq/L)   Chloride 98  96 - 112 (mEq/L)   CO2 26  19 - 32 (mEq/L)   Glucose, Bld 178 (*) 70 - 99 (mg/dL)   BUN 25 (*) 6 - 23 (mg/dL)   Creatinine, Ser 4.69  0.50 - 1.35 (mg/dL)   Calcium 8.2 (*) 8.4 - 10.5 (mg/dL)   GFR calc non Af Amer 52 (*) >90 (mL/min)   GFR calc Af Amer 61 (*) >90 (mL/min)  GLUCOSE, CAPILLARY      Component Value Range   Glucose-Capillary 170 (*) 70 - 99 (mg/dL)  GLUCOSE, CAPILLARY  Component Value Range   Glucose-Capillary 178 (*) 70 - 99 (mg/dL)  GLUCOSE, CAPILLARY      Component Value Range   Glucose-Capillary 172 (*) 70 - 99 (mg/dL)   Comment 1 Notify RN    PROTIME-INR      Component Value Range   Prothrombin Time 16.3 (*) 11.6 - 15.2 (seconds)   INR 1.29  0.00 - 1.49   CBC      Component Value Range   WBC 8.8  4.0 - 10.5 (K/uL)   RBC 2.59 (*) 4.22 - 5.81 (MIL/uL)   Hemoglobin 8.1 (*) 13.0 - 17.0 (g/dL)   HCT 16.1 (*) 09.6 - 52.0 (%)   MCV 90.7  78.0 - 100.0 (fL)   MCH 31.3  26.0 - 34.0 (pg)   MCHC 34.5  30.0 - 36.0 (g/dL)   RDW 04.5  40.9 - 81.1 (%)   Platelets 107 (*) 150 - 400 (K/uL)  BASIC METABOLIC PANEL      Component Value Range   Sodium 132 (*) 135 - 145 (mEq/L)   Potassium 3.3 (*) 3.5 - 5.1 (mEq/L)   Chloride 97  96 - 112 (mEq/L)   CO2 28  19 - 32 (mEq/L)   Glucose, Bld 150 (*) 70 - 99 (mg/dL)   BUN 25 (*) 6 - 23 (mg/dL)   Creatinine, Ser 9.14 (*) 0.50 - 1.35 (mg/dL)   Calcium 8.2 (*) 8.4 - 10.5 (mg/dL)   GFR calc non Af Amer 35 (*) >90 (mL/min)   GFR calc Af Amer 40 (*) >90 (mL/min)  GLUCOSE, CAPILLARY      Component Value Range   Glucose-Capillary 162 (*) 70 - 99 (mg/dL)  GLUCOSE, CAPILLARY      Component Value Range   Glucose-Capillary 196 (*) 70 - 99 (mg/dL)  GLUCOSE, CAPILLARY      Component Value Range     Glucose-Capillary 176 (*) 70 - 99 (mg/dL)  PREPARE RBC (CROSSMATCH)      Component Value Range   Order Confirmation ORDER PROCESSED BY BLOOD BANK    GLUCOSE, CAPILLARY      Component Value Range   Glucose-Capillary 201 (*) 70 - 99 (mg/dL)  GLUCOSE, CAPILLARY      Component Value Range   Glucose-Capillary 122 (*) 70 - 99 (mg/dL)  PROTIME-INR      Component Value Range   Prothrombin Time 16.6 (*) 11.6 - 15.2 (seconds)   INR 1.32  0.00 - 1.49   CBC      Component Value Range   WBC 10.0  4.0 - 10.5 (K/uL)   RBC 2.86 (*) 4.22 - 5.81 (MIL/uL)   Hemoglobin 8.8 (*) 13.0 - 17.0 (g/dL)   HCT 78.2 (*) 95.6 - 52.0 (%)   MCV 88.1  78.0 - 100.0 (fL)   MCH 30.8  26.0 - 34.0 (pg)   MCHC 34.9  30.0 - 36.0 (g/dL)   RDW 21.3  08.6 - 57.8 (%)   Platelets 87 (*) 150 - 400 (K/uL)  BASIC METABOLIC PANEL      Component Value Range   Sodium 129 (*) 135 - 145 (mEq/L)   Potassium 3.5  3.5 - 5.1 (mEq/L)   Chloride 95 (*) 96 - 112 (mEq/L)   CO2 26  19 - 32 (mEq/L)   Glucose, Bld 187 (*) 70 - 99 (mg/dL)   BUN 28 (*) 6 - 23 (mg/dL)   Creatinine, Ser 4.69 (*) 0.50 - 1.35 (mg/dL)   Calcium 8.0 (*) 8.4 - 10.5 (mg/dL)  GFR calc non Af Amer 32 (*) >90 (mL/min)   GFR calc Af Amer 37 (*) >90 (mL/min)  GLUCOSE, CAPILLARY      Component Value Range   Glucose-Capillary 173 (*) 70 - 99 (mg/dL)  GLUCOSE, CAPILLARY      Component Value Range   Glucose-Capillary 174 (*) 70 - 99 (mg/dL)  PRO B NATRIURETIC PEPTIDE      Component Value Range   BNP, POC 1672.0 (*) 0 - 450 (pg/mL)  URINALYSIS, ROUTINE W REFLEX MICROSCOPIC      Component Value Range   Color, Urine AMBER (*) YELLOW    Appearance CLOUDY (*) CLEAR    Specific Gravity, Urine 1.020  1.005 - 1.030    pH 5.5  5.0 - 8.0    Glucose, UA NEGATIVE  NEGATIVE (mg/dL)   Hgb urine dipstick LARGE (*) NEGATIVE    Bilirubin Urine NEGATIVE  NEGATIVE    Ketones, ur NEGATIVE  NEGATIVE (mg/dL)   Protein, ur 440 (*) NEGATIVE (mg/dL)   Urobilinogen, UA 0.2  0.0 -  1.0 (mg/dL)   Nitrite NEGATIVE  NEGATIVE    Leukocytes, UA SMALL (*) NEGATIVE   CREATININE, URINE, RANDOM      Component Value Range   Creatinine, Urine 163.6    NA AND K (SODIUM & POTASSIUM), RAND UR      Component Value Range   Sodium, Ur 50     Potassium Urine Timed 26    HEMOGLOBIN AND HEMATOCRIT, BLOOD      Component Value Range   Hemoglobin 9.1 (*) 13.0 - 17.0 (g/dL)   HCT 10.2 (*) 72.5 - 52.0 (%)  OSMOLALITY, URINE      Component Value Range   Osmolality, Ur 471  390 - 1090 (mOsm/kg)  URINE MICROSCOPIC-ADD ON      Component Value Range   Squamous Epithelial / LPF RARE  RARE    WBC, UA 3-6  <3 (WBC/hpf)   RBC / HPF 3-6  <3 (RBC/hpf)   Bacteria, UA MANY (*) RARE   GLUCOSE, CAPILLARY      Component Value Range   Glucose-Capillary 147 (*) 70 - 99 (mg/dL)  GLUCOSE, CAPILLARY      Component Value Range   Glucose-Capillary 200 (*) 70 - 99 (mg/dL)  PROTIME-INR      Component Value Range   Prothrombin Time 18.6 (*) 11.6 - 15.2 (seconds)   INR 1.52 (*) 0.00 - 1.49   CBC      Component Value Range   WBC 10.9 (*) 4.0 - 10.5 (K/uL)   RBC 2.66 (*) 4.22 - 5.81 (MIL/uL)   Hemoglobin 8.3 (*) 13.0 - 17.0 (g/dL)   HCT 36.6 (*) 44.0 - 52.0 (%)   MCV 88.7  78.0 - 100.0 (fL)   MCH 31.2  26.0 - 34.0 (pg)   MCHC 35.2  30.0 - 36.0 (g/dL)   RDW 34.7  42.5 - 95.6 (%)   Platelets 108 (*) 150 - 400 (K/uL)  RENAL FUNCTION PANEL      Component Value Range   Sodium 131 (*) 135 - 145 (mEq/L)   Potassium 3.6  3.5 - 5.1 (mEq/L)   Chloride 95 (*) 96 - 112 (mEq/L)   CO2 25  19 - 32 (mEq/L)   Glucose, Bld 103 (*) 70 - 99 (mg/dL)   BUN 36 (*) 6 - 23 (mg/dL)   Creatinine, Ser 3.87 (*) 0.50 - 1.35 (mg/dL)   Calcium 8.1 (*) 8.4 - 10.5 (mg/dL)   Phosphorus 2.7  2.3 -  4.6 (mg/dL)   Albumin 2.2 (*) 3.5 - 5.2 (g/dL)   GFR calc non Af Amer 26 (*) >90 (mL/min)   GFR calc Af Amer 30 (*) >90 (mL/min)  HEPATIC FUNCTION PANEL      Component Value Range   Total Protein 5.5 (*) 6.0 - 8.3 (g/dL)    Albumin 2.2 (*) 3.5 - 5.2 (g/dL)   AST 69 (*) 0 - 37 (U/L)   ALT <5  0 - 53 (U/L)   Alkaline Phosphatase 46  39 - 117 (U/L)   Total Bilirubin 0.5  0.3 - 1.2 (mg/dL)   Bilirubin, Direct 0.2  0.0 - 0.3 (mg/dL)   Indirect Bilirubin 0.3  0.3 - 0.9 (mg/dL)  GLUCOSE, CAPILLARY      Component Value Range   Glucose-Capillary 122 (*) 70 - 99 (mg/dL)   Comment 1 Documented in Chart     Comment 2 Notify RN    GLUCOSE, CAPILLARY      Component Value Range   Glucose-Capillary 113 (*) 70 - 99 (mg/dL)    Exam - Neurologically intact ABD soft Neurovascular intact No cellulitis present Compartment soft Dressing/Incision - clean, mild serous drainage; no erythema; no signs of infection. Thigh swollen but less than expected given extent of surgery Motor function intact - moving foot and toes well on exam.   Assessment/Plan: 4 Days Post-Op Procedure(s) (LRB): TOTAL HIP REVISION (Right)  Past Medical History  Diagnosis Date  . Hypertension   . Stroke 05-03-11    TIA- more than 20 yrs ago.  . Asthma   . Shortness of breath 05-03-11    SOB with exertion only  . Diabetes mellitus 05-03-11    Oral meds used-Dabetes x5 yrs-CBG's (145)  . Neuromuscular disorder 05-03-11    Dx. Parkinson's -essential tremors-generally, ambulates with walker  . Arthritis 05-03-11    most joints- Bil. hips/knees replaced in the past  . Skin cancer 05-03-11    Past hx. skin cancer lesions-removed  . DEMENTIA 05-03-11    hx. Alzheimers-tx. Aricept    Appreciate medical consult for renal insufficiency. Has post-op anemia with Hb 8.3. Would benefit from transfusion of 2 additional units of PRBCs. He is lethargic this AM but no focal signs on exam.  DVT Prophylaxis -  Coumadin Protocol TDWB right Leg  Preston Carter V 05/15/2011, 8:25 AM

## 2011-05-15 NOTE — Progress Notes (Signed)
Physical Therapy Treatment Patient Details Name: Preston Carter MRN: [id number] DOB: [birth year] Today's Date: 05/15/2011 1ta 1249-1305p PT Assessment/Plan  PT - Assessment/Plan Comments on Treatment Session: pt Hgb 8.1 and to get blood again today; pt more lethargic, requiring constant verbal stimuli to maintain arousal/participate minimally with PT; RN present upon departure, starting blood; pt in acute renal failure, sodium decreased PT Plan: Discharge plan remains appropriate PT Frequency: Min 6X/week Follow Up Recommendations: Skilled nursing facility Equipment Recommended: Defer to next venue PT Goals   pt not progressing toward goals due to medical complications.  Will continue to follow/monitor.  PT Treatment Precautions/Restrictions  Precautions Precautions: Posterior Hip Precaution Comments:  Required Braces or Orthoses: No Restrictions Weight Bearing Restrictions: Yes RLE Weight Bearing: Touchdown weight bearing Mobility (including Balance) Bed Mobility Rolling Right: 1: +2 Total assist Rolling Right Details (indicate cue type and reason): +2 total assist pt=10%;  assist NT with rolling pt to place bed pan     Exercise  Total Joint Exercises Ankle Circles/Pumps: AAROM;Both;15 reps Quad Sets: AROM;Both;10 reps (constant cues to stay on task, participate) End of Session PT - End of Session Activity Tolerance: Treatment limited secondary to medical complications (Comment) (limited d/t increased lethargy) General Behavior During Session: Lethargic Cognition: Impaired, at baseline  [facility] 05/15/2011, 1:05 PM

## 2011-05-15 NOTE — Consult Note (Signed)
Referring Provider: Hartley Barefoot Primary Care Physician:  No primary provider on file. Primary Nephrologist:    Reason for Consultation:  Acute non oliguric renal failure  HPI: This is a very pleasant 77 year old who was admitted on November 21 for an elective right hip revision and replacement. Patient is Day 4 post op. He has worsening renal function, he had a temperature of 102 on November 23 , hyponatremia, and post operative blood loss. Results for Preston Carter, Preston Carter (MRN [id number]) as of 05/15/2011 12:03  Ref. Range 05/12/2011 04:04 05/13/2011 03:55 05/14/2011 04:02 05/15/2011 04:38 05/15/2011 04:38  Sodium Latest Range: 135-145 mEq/L 133 (L) 132 (L) 129 (L)  131 (L)  Potassium Latest Range: 3.5-5.1 mEq/L 4.0 3.3 (L) 3.5  3.6  Chloride Latest Range: 96-112 mEq/L 98 97 95 (L)  95 (L)  CO2 Latest Range: 19-32 mEq/L 26 28 26  25   BUN Latest Range: 6-23 mg/dL 25 (H) 25 (H) 28 (H)  36 (H)  Creat Latest Range: 0.50-1.35 mg/dL 4.09 8.11 (H) 9.14 (H)  2.31 (H)  Calcium Latest Range: 8.4-10.5 mg/dL 8.2 (L) 8.2 (L) 8.0 (L)  8.1 (L)  GFR calc non Af Amer Latest Range: >90 mL/min 52 (L) 35 (L) 32 (L)  26 (L)  GFR calc Af Amer Latest Range: >90 mL/min 61 (L) 40 (L) 37 (L)  30 (L)  Glucose Latest Range: 70-99 mg/dL 782 (H) 956 (H) 213 (H)  103 (H)  Phosphorus Latest Range: 2.3-4.6 mg/dL     2.7  Alkaline Phosphatase Latest Range: 39-117 U/L    46   Albumin Latest Range: 3.5-5.2 g/dL    2.2 (L) 2.2 (L)  AST Latest Range: 0-37 U/L    69 (H)   ALT Latest Range: 0-53 U/L    <5   Total Protein Latest Range: 6.0-8.3 g/dL    5.5 (L)   Bilirubin, Direct Latest Range: 0.0-0.3 mg/dL    0.2   Indirect Bilirubin Latest Range: 0.3-0.9 mg/dL    0.3   Total Bilirubin Latest Range: 0.3-1.2 mg/dL    0.5    Fluid Balance has positive since surgery wth a cumulative increase in 4 L. And a urine output of 775 cc last 24hours  The Patient was also using lasix, Benicar, Metformin and Maxide until yesterday  11/24     Past Medical History  Diagnosis Date  . Hypertension   . Stroke 05-03-11    TIA- more than 20 yrs ago.  . Asthma   . Shortness of breath 05-03-11    SOB with exertion only  . Diabetes mellitus 05-03-11    Oral meds used-Dabetes x5 yrs-CBG's (145)  . Neuromuscular disorder 05-03-11    Dx. Parkinson's -essential tremors-generally, ambulates with walker  . Arthritis 05-03-11    most joints- Bil. hips/knees replaced in the past  . Skin cancer 05-03-11    Past hx. skin cancer lesions-removed  . DEMENTIA 05-03-11    hx. Alzheimers-tx. Aricept    Past Surgical History  Procedure Date  . Joint replacement 05-03-11    Bil. hip/knee repalcements-now RT. Hip to be revised 05-11-11  . Tonsillectomy   . Uvulopalatopharyngoplasty 05-03-11    x2 . Can.t tolerate cpap machine, doesn't use  . Cholecystectomy   . Eye surgery 05-03-11    lt. eye for lazy eye  . Carpal tunnel release 05-03-11    bilateral    Prior to Admission medications   Medication Sig Start Date End Date Taking? Authorizing Provider  carbidopa-levodopa-entacapone (STALEVO)  25-100-200 MG per tablet Take 1 tablet by mouth 3 (three) times daily.    Yes Historical Provider, MD  cholecalciferol (VITAMIN D) 1000 UNITS tablet Take 1,000 Units by mouth daily.     Yes Historical Provider, MD  donepezil (ARICEPT) 5 MG tablet Take 5 mg by mouth at bedtime.    Yes Historical Provider, MD  furosemide (LASIX) 40 MG tablet Take 40 mg by mouth every morning.    Yes Historical Provider, MD  glimepiride (AMARYL) 2 MG tablet Take 2 mg by mouth daily. Pt takes at 2 pm    Yes Historical Provider, MD  guaiFENesin-dextromethorphan (ROBITUSSIN DM) 100-10 MG/5ML syrup Take 5 mLs by mouth 3 (three) times daily as needed. cough   Yes Historical Provider, MD  HYDROcodone-acetaminophen (NORCO) 5-325 MG per tablet Take 1 tablet by mouth every 6 (six) hours as needed. Pain    Yes Historical Provider, MD  ketoconazole (NIZORAL) 2 % cream  Apply 1 application topically daily as needed. itching   Yes Historical Provider, MD  loratadine (CLARITIN) 10 MG tablet Take 10 mg by mouth every morning.    Yes Historical Provider, MD  metFORMIN (GLUCOPHAGE) 500 MG tablet Take 500 mg by mouth every morning.    Yes Historical Provider, MD  mometasone (NASONEX) 50 MCG/ACT nasal spray Place 2 sprays into the nose daily as needed. Allergies     Yes Historical Provider, MD  niacin 500 MG tablet Take 500 mg by mouth 2 (two) times daily with a meal.     Yes Historical Provider, MD  olmesartan (BENICAR) 20 MG tablet Take 20 mg by mouth every morning.    Yes Historical Provider, MD  Olopatadine HCl (PATADAY) 0.2 % SOLN Place 1 drop into both eyes daily as needed. Dry eye   Yes Historical Provider, MD  OVER THE COUNTER MEDICATION Take 1 tablet by mouth 3 (three) times daily. Lithium Aspartate 5 mg    Yes Historical Provider, MD  potassium chloride SA (K-DUR,KLOR-CON) 20 MEQ tablet Take 20 mEq by mouth every morning.    Yes Historical Provider, MD  PRESCRIPTION MEDICATION Inject 0.5 mLs as directed every 7 (seven) days. Allergy shots    Yes Historical Provider, MD  simvastatin (ZOCOR) 40 MG tablet Take 40 mg by mouth at bedtime.    Yes Historical Provider, MD  temazepam (RESTORIL) 30 MG capsule Take 30 mg by mouth at bedtime.    Yes Historical Provider, MD  traMADol (ULTRAM) 50 MG tablet Take 50 mg by mouth every 8 (eight) hours as needed. Pain     Yes Historical Provider, MD  triamterene-hydrochlorothiazide (MAXZIDE) 75-50 MG per tablet Take 1 tablet by mouth every morning.    Yes Historical Provider, MD  aspirin EC 81 MG tablet Take 81 mg by mouth every morning.      Historical Provider, MD  ipratropium-albuterol (DUONEB) 0.5-2.5 (3) MG/3ML SOLN Take 3 mLs by nebulization every 4 (four) hours as needed. wheezing    Historical Provider, MD  Multiple Vitamins-Minerals (MULTIVITAMINS THER. W/MINERALS) TABS Take 1 tablet by mouth daily.      Historical  Provider, MD  multivitamin-lutein [facility]) CAPS Take 1 capsule by mouth daily.      Historical Provider, MD  Omega-3 Fatty Acids (FISH OIL) 1200 MG CAPS Take 1 capsule by mouth 2 (two) times daily.      Historical Provider, MD  vitamin C (ASCORBIC ACID) 500 MG tablet Take 500 mg by mouth daily.     Historical Provider, MD  Current Facility-Administered Medications  Medication Dose Route Frequency Provider Last Rate Last Dose  . acetaminophen (TYLENOL) tablet 650 mg  650 mg Oral Q6H PRN Gus Rankin Aluisio   650 mg at 05/14/11 1348   Or  . acetaminophen (TYLENOL) suppository 650 mg  650 mg Rectal Q6H PRN Gus Rankin Aluisio      . acetaminophen (TYLENOL) tablet 650 mg  650 mg Oral Once [employer]      . albuterol (PROVENTIL) (5 MG/ML) 0.5% nebulizer solution 2.5 mg  2.5 mg Nebulization Q4H PRN Otho Bellows, PHARMD      . antiseptic oral rinse (BIOTENE) solution 15 mL  15 mL Mouth Rinse BID Gus Rankin Aluisio      . bisacodyl (DULCOLAX) EC tablet 10 mg  10 mg Oral Daily PRN Gus Rankin Aluisio       Or  . bisacodyl (DULCOLAX) suppository 10 mg  10 mg Rectal Daily PRN Gus Rankin Aluisio   10 mg at 05/15/11 1118  . carbidopa-levodopa (SINEMET) 25-100 MG per tablet 1 tablet  1 tablet Oral TID Otho Bellows, PHARMD   1 tablet at 05/15/11 1118  . docusate sodium (COLACE) capsule 100 mg  100 mg Oral BID Gus Rankin Aluisio   100 mg at 05/15/11 1120  . donepezil (ARICEPT) tablet 5 mg  5 mg Oral QHS Gus Rankin Aluisio   5 mg at 05/14/11 2131  . entacapone (COMTAN) tablet 200 mg  200 mg Oral TID Otho Bellows, PHARMD   200 mg at 05/15/11 1119  . fluticasone (FLONASE) 50 MCG/ACT nasal spray 1 spray  1 spray Each Nare Daily Gus Rankin Aluisio   1 spray at 05/15/11 1118  . furosemide (LASIX) injection 20 mg  20 mg Intravenous Once Belkys Regalado, MD      . insulin aspart (novoLOG) injection 0-15 Units  0-15 Units Subcutaneous TID WC Frank V Aluisio   3 Units at 05/14/11 1708  . ipratropium (ATROVENT) nebulizer  solution 0.5 mg  0.5 mg Nebulization Q4H PRN Otho Bellows, PHARMD      . ketoconazole (NIZORAL) 2 % cream 1 application  1 application Topical Daily PRN Gus Rankin Aluisio      . loratadine (CLARITIN) tablet 10 mg  10 mg Oral Daily Homero Fellers V Aluisio   10 mg at 05/15/11 1119  . magnesium hydroxide (MILK OF MAGNESIA) suspension 30 mL  30 mL Oral Q12H PRN Gus Rankin Aluisio      . menthol-cetylpyridinium (CEPACOL) lozenge 3 mg  1 lozenge Oral PRN Gus Rankin Aluisio       Or  . phenol (CHLORASEPTIC) mouth spray 1 spray  1 spray Mouth/Throat PRN Gus Rankin Aluisio      . niacin tablet 500 mg  500 mg Oral BID WC Homero Fellers V Aluisio   500 mg at 05/15/11 1610  . olopatadine (PATANOL) 0.1 % ophthalmic solution 1 drop  1 drop Both Eyes Daily PRN Gus Rankin Aluisio      . ondansetron (ZOFRAN) tablet 4 mg  4 mg Oral Q6H PRN Gus Rankin Aluisio       Or  . ondansetron (ZOFRAN) injection 4 mg  4 mg Intravenous Q6H PRN Gus Rankin Aluisio   4 mg at 05/14/11 0943  . piperacillin-tazobactam (ZOSYN) IVPB 3.375 g  3.375 g Intravenous Q8H Frank V Aluisio   3.375 g at 05/15/11 0444  . polyethylene glycol (MIRALAX / GLYCOLAX) packet 17 g  17 g Oral BID Hartley Barefoot, MD  17 g at 05/15/11 1119  . simvastatin (ZOCOR) tablet 40 mg  40 mg Oral QHS Gus Rankin Aluisio   40 mg at 05/14/11 2127  . sodium chloride 0.9 % 50 mL with cefTRIAXone (ROCEPHIN) 1 g infusion   Intravenous Q24H Belkys Regalado, MD      . sodium phosphate (FLEET) 7-19 GM/118ML enema 1 enema  1 enema Rectal Daily PRN Gus Rankin Aluisio      . warfarin (COUMADIN) tablet 7.5 mg  7.5 mg Oral ONCE-1800 Terri L Green, PHARMD   7.5 mg at 05/14/11 1708  . warfarin (COUMADIN) tablet 7.5 mg  7.5 mg Oral ONCE-1800 Christine [address] [place], [place]      . DISCONTD: 0.9 %  sodium chloride infusion   Intravenous Continuous Gus Rankin Aluisio 20 mL/hr at 05/13/11 0400    . DISCONTD: antiseptic oral rinse (BIOTENE) solution 15 mL  15 mL Mouth Rinse BID Gus Rankin Aluisio      . DISCONTD: cefTRIAXone  (ROCEPHIN) 1 g in dextrose 5 % 50 mL IVPB  1 g Intravenous Q24H Belkys Regalado, MD   1 g at 05/14/11 1348  . DISCONTD: cefTRIAXone (ROCEPHIN) 1 g in dextrose 5 % 50 mL IVPB  1 g Intravenous Q24H Belkys Regalado, MD      . DISCONTD: diphenhydrAMINE (BENADRYL) 12.5 MG/5ML elixir 12.5-25 mg  12.5-25 mg Oral Q4H PRN Gus Rankin Aluisio      . DISCONTD: enoxaparin (LOVENOX) injection 40 mg  40 mg Subcutaneous Q24H Liam Graham, PA   40 mg at 05/14/11 0943  . DISCONTD: glimepiride (AMARYL) tablet 2 mg  2 mg Oral Daily Gus Rankin Aluisio   2 mg at 05/14/11 1129  . DISCONTD: methocarbamol (ROBAXIN) 500 mg in dextrose 5 % 50 mL IVPB  500 mg Intravenous Q6H PRN Gus Rankin Aluisio   500 mg at 05/11/11 1841  . DISCONTD: methocarbamol (ROBAXIN) tablet 500 mg  500 mg Oral Q6H PRN Gus Rankin Aluisio   500 mg at 05/13/11 1606  . DISCONTD: metoCLOPramide (REGLAN) injection 5-10 mg  5-10 mg Intravenous Q8H PRN Gus Rankin Aluisio      . DISCONTD: metoCLOPramide (REGLAN) tablet 5-10 mg  5-10 mg Oral Q8H PRN Gus Rankin Aluisio      . DISCONTD: morphine 2 MG/ML injection 1 mg  1 mg Intravenous Q2H PRN Gus Rankin Aluisio   1 mg at 05/12/11 1837  . DISCONTD: oxyCODONE (Oxy IR/ROXICODONE) immediate release tablet 5-10 mg  5-10 mg Oral Q3H PRN Gus Rankin Aluisio   5 mg at 05/14/11 1707  . DISCONTD: temazepam (RESTORIL) capsule 30 mg  30 mg Oral QHS Frank V Aluisio   30 mg at 05/14/11 2349  . DISCONTD: traMADol (ULTRAM) tablet 50 mg  50 mg Oral Q8H PRN Gus Rankin Aluisio       Facility-Administered Medications Ordered in Other Encounters  Medication Dose Route Frequency Provider Last Rate Last Dose  . chlorhexidine (HIBICLENS) 4 % liquid 4 application  60 mL Topical Once Frank V Aluisio        Allergies as of 03/31/2011  . (No Known Allergies)    History reviewed. No pertinent family history.  History   Social History  . Marital Status: Married    Spouse Name: N/A    Number of Children: N/A  . Years of Education: N/A    Occupational History  . Not on file.   Social History Main Topics  . Smoking status: Former Smoker -- 1.5 packs/day for 0 years  .  Smokeless tobacco: Not on file  . Alcohol Use:   . Drug Use:   . Sexually Active: No   Other Topics Concern  . Not on file   Social History Narrative  . No narrative on file    Review of Systems: Gen: Denies any fever, chills, sweats, anorexia, fatigue, weakness, malaise, weight loss, and sleep disorder HEENT: No visual complaints, No history of Retinopathy. Normal external appearance No Epistaxis or Sore throat. No sinusitis.   CV: Denies chest pain, angina, palpitations, syncope, orthopnea, PND, peripheral edema, and claudication. Resp: Denies dyspnea at rest, dyspnea with exercise, cough, sputum, wheezing, coughing up blood, and pleurisy. GI: Denies vomiting blood, jaundice, and fecal incontinence.   Denies dysphagia or odynophagia. GU : Denies urinary burning, blood in urine, urinary frequency, urinary hesitancy, nocturnal urination, and urinary incontinence.  No renal calculi. MS: Denies joint pain, limitation of movement, and swelling, stiffness, low back pain, extremity pain. Denies muscle weakness, cramps, atrophy.  No use of non steroidal antiinflammatory drugs. Derm: Denies rash, itching, dry skin, hives, moles, warts, or unhealing ulcers.  Psych: Denies depression, anxiety, memory loss, suicidal ideation, hallucinations, paranoia, and confusion. Heme: Denies bruising, bleeding, and enlarged lymph nodes. Neuro: No headache.  No diplopia. No dysarthria.  No dysphasia.  No history of CVA.  No Seizures. No paresthesias.  No weakness. Endocrine No DM.  No Thyroid disease.  No Adrenal disease.  Physical Exam: Vital signs in last 24 hours: Temp:  [98.4 F (36.9 C)-102.6 F (39.2 C)] 100.7 F (38.2 C) (11/25 0520) Pulse Rate:  [72-79] 72  (11/25 0520) Resp:  [16-20] 20  (11/25 0520) BP: (102-110)/(46-62) 104/46 mmHg (11/25 0520) SpO2:  [92  %-100 %] 97 % (11/25 0520) Last BM Date: 05/11/11 General:   Alert,  Well-developed, well-nourished, pleasant and cooperative in NAD Head:  Normocephalic and atraumatic. Eyes:  Sclera clear, no icterus.   Conjunctiva pink. Ears:  Normal auditory acuity. Nose:  No deformity, discharge,  or lesions. Mouth:  No deformity or lesions, dentition normal. Neck:  Supple; no masses or thyromegaly. JVP not elevated Lungs:  Clear throughout to auscultation.   No wheezes, crackles, or rhonchi. No acute distress. Heart:  Regular rate and rhythm; no murmurs, clicks, rubs,  or gallops. Abdomen:  Soft, nontender and nondistended. No masses, hepatosplenomegaly or hernias noted. Normal bowel sounds, without guarding, and without rebound.   Msk:  Symmetrical without gross deformities. Normal posture. Pulses:  No carotid, renal, femoral bruits. DP and PT symmetrical and equal Extremities:  Without clubbing or edema. Neurologic:  Alert and  oriented x4;  grossly normal neurologically. Skin:  Intact without significant lesions or rashes. Cervical Nodes:  No significant cervical adenopathy. Psych:  Alert and cooperative. Normal mood and affect.  Intake/Output from previous day: 11/24 0701 - 11/25 0700 In: 360 [P.O.:360] Out: 775 [Urine:775] Intake/Output this shift: Total I/O In: 360 [P.O.:360] Out: -   Lab Results:  URINALYSIS  100 PROTEIN 3-6 rbc 3-6 wbc     Basename 05/15/11 0901 05/15/11 0438 05/14/11 2023 05/14/11 0402 05/13/11 0355  WBC -- 10.9* -- 10.0 8.8  HGB 8.1* 8.3* 9.1* -- --  HCT 23.4* 23.6* 26.1* -- --  PLT -- 108* -- 87* 107*   BMET  Basename 05/15/11 0438 05/14/11 0402 05/13/11 0355  NA 131* 129* 132*  K 3.6 3.5 3.3*  CL 95* 95* 97  CO2 25 26 28   GLUCOSE 103* 187* 150*  BUN 36* 28* 25*  CREATININE 2.31* 1.94* 1.79*  CALCIUM 8.1* 8.0* 8.2*  PHOS 2.7 -- --   LFT  Basename 05/15/11 0438  PROT 5.5*  ALBUMIN 2.2*2.2*  AST 69*  ALT <5  ALKPHOS 46  BILITOT 0.5   BILIDIR 0.2  IBILI 0.3   PT/INR  Basename 05/15/11 0438 05/14/11 0402  LABPROT 18.6* 16.6*  INR 1.52* 1.32   Hepatitis Panel No results found for this basename: HEPBSAG,HCVAB,HEPAIGM,HEPBIGM in the last 72 hours  Studies/Results:  RENAL ULTRASOUND no Hydronephrosis   Dg Chest 2 View  05/14/2011  *RADIOLOGY REPORT*  Clinical Data: Fever  CHEST - 2 VIEW  Comparison: 05/03/2011  Findings: Both views degraded by patient positioning.  2 lateral views are essentially nondiagnostic.  The frontal view is degraded by AP portable positioning.  The patient is minimally rotated.  Mild cardiomegaly.  No definite pleural effusion. No pneumothorax. Low lung volumes with resultant pulmonary interstitial prominence. Limited evaluation of the left lung base secondary to overlying soft tissues.  IMPRESSION:  1. Mildly degraded exam (primarily lateral view), secondary to patient positioning. 2.  Cardiomegaly and low lung volumes, without definite acute process.  Original Report Authenticated By: Consuello Bossier, M.D.    Assessment/Plan  Pleasant 77 year old man with acute renal failure in setting of diuretics and ARB therapy.   1.   This would lead one to believe that the most likely cause for the renal insufficiency would be ischemic acute tubular necrosis.  The volume status appears well controlled and even though his balance is about 4L. I would continue to follow for now. There appeared to be no pulmonary edema on his CXR from yesterday.  I will discontinue the sodium phosphate enema and adjust the IV antibiotics due to his renal failure Zosyn 2.25mg  q 8hrs   LOS: 4 Shanya Ferriss W @TODAY @12 :04 PM

## 2011-05-15 NOTE — Progress Notes (Signed)
ANTICOAGULATION CONSULT NOTE - Follow Up Consult  Pharmacy Consult for Coumadin Indication: VTE prophylaxis s/p right total hip revision  No Known Allergies  Patient Measurements: Height: 5\' 6"  (167.6 cm) Weight: 280 lb (127.007 kg) IBW/kg (Calculated) : 63.8   Vital Signs: Temp: 100.7 F (38.2 C) (11/25 0520) Temp src: Oral (11/25 0520) BP: 104/46 mmHg (11/25 0520) Pulse Rate: 72  (11/25 0520)  Labs:  Basename 05/15/11 0901 05/15/11 0438 05/14/11 2023 05/14/11 0402 05/13/11 0355  HGB 8.1* 8.3* -- -- --  HCT 23.4* 23.6* 26.1* -- --  PLT -- 108* -- 87* 107*  APTT -- -- -- -- --  LABPROT -- 18.6* -- 16.6* 16.3*  INR -- 1.52* -- 1.32 1.29  HEPARINUNFRC -- -- -- -- --  CREATININE -- 2.31* -- 1.94* 1.79*  CKTOTAL -- -- -- -- --  CKMB -- -- -- -- --  TROPONINI -- -- -- -- --   Estimated Creatinine Clearance: 33.8 ml/min (by C-G formula based on Cr of 2.31).   Medications:  Scheduled:     . acetaminophen  650 mg Oral Once  . antiseptic oral rinse  15 mL Mouth Rinse BID  . carbidopa-levodopa  1 tablet Oral TID  . docusate sodium  100 mg Oral BID  . donepezil  5 mg Oral QHS  . enoxaparin (LOVENOX) injection  40 mg Subcutaneous Q24H  . entacapone  200 mg Oral TID  . fluticasone  1 spray Each Nare Daily  . insulin aspart  0-15 Units Subcutaneous TID WC  . loratadine  10 mg Oral Daily  . niacin  500 mg Oral BID WC  . piperacillin-tazobactam (ZOSYN)  IV  3.375 g Intravenous Q8H  . polyethylene glycol  17 g Oral BID  . simvastatin  40 mg Oral QHS  . sodium chloride 0.9 % 50 mL with cefTRIAXone (ROCEPHIN) 1 g infusion   Intravenous Q24H  . warfarin  7.5 mg Oral ONCE-1800  . DISCONTD: antiseptic oral rinse  15 mL Mouth Rinse BID  . DISCONTD: cefTRIAXone (ROCEPHIN) IV  1 g Intravenous Q24H  . DISCONTD: cefTRIAXone (ROCEPHIN) IV  1 g Intravenous Q24H  . DISCONTD: glimepiride  2 mg Oral Daily  . DISCONTD: temazepam  30 mg Oral QHS  Anticoagulants: Lovenox 40mg  SQ  daily Coumadin 7.5mg  on 11/21, 11/22, 11/23, 11/24  Assessment: 77 yo male s/p R total hip revision started on coumadin 11/21. INR subtherapeutic at 1.5 today, but increasing slowly. Started on broad spectrum antibiotics for possible sepsis/UTI. Renal function worsening with normalized CrCl  ~27  Goal of Therapy:  INR 2-3   Plan:  Continue Coumadin 7.5 mg PO x1 today D/C lovenox when INR therapeutic Follow up daily PT/INR, SCr Coumadin education, will wait for spouse to teach. Plan SNF at discharge for rehab

## 2011-05-15 NOTE — Progress Notes (Signed)
Subjective: Sleepy, arousable. Following some commands. Denies Dyspnea.  Objective: Filed Vitals:   05/14/11 1505 05/14/11 1923 05/14/11 2110 05/15/11 0520  BP:   102/62 104/46  Pulse:   79 72  Temp: 100 F (37.8 C) 98.4 F (36.9 C) 101.6 F (38.7 C) 100.7 F (38.2 C)  TempSrc:   Oral Oral  Resp:   20 20  Height:      Weight:      SpO2:   100% 97%   Weight change:   Intake/Output Summary (Last 24 hours) at 05/15/11 1028 Last data filed at 05/15/11 0745  Gross per 24 hour  Intake    720 ml  Output    775 ml  Net    -55 ml    General: Sleepy, arousable, following some command., in no acute distress.  HEENT: No bruits, no goiter.  Heart: Regular rate and rhythm, without murmurs, rubs, gallops.  Lungs: , bilateral air movement, no wheezes, distant sounds.  Abdomen: Soft, nontender, nondistended, positive bowel sounds.  Neuro: following some command.    Lab Results:  [facility] 05/15/11 0438 05/14/11 0402  NA 131* 129*  K 3.6 3.5  CL 95* 95*  CO2 25 26  GLUCOSE 103* 187*  BUN 36* 28*  CREATININE 2.31* 1.94*  CALCIUM 8.1* 8.0*  MG -- --  PHOS 2.7 --    [facility] 05/15/11 0438  AST 69*  ALT <5  ALKPHOS 46  BILITOT 0.5  PROT 5.5*  ALBUMIN 2.2*2.2*    Basename 05/15/11 0901 05/15/11 0438 05/14/11 0402  WBC -- 10.9* 10.0  NEUTROABS -- -- --  HGB 8.1* 8.3* --  HCT 23.4* 23.6* --  MCV -- 88.7 88.1  PLT -- 108* 87*   Basename 05/14/11 0402  POCBNP 1672.0*    Micro Results: Recent Results (from the past 240 hour(s))  URINE CULTURE     Status: Normal   Collection Time   05/14/11 10:00 AM      Component Value Range Status Comment   Specimen Description URINE, CATHETERIZED   Final    Special Requests NONE   Final    Setup Time [id number]   Final    Colony Count NO GROWTH   Final    Culture NO GROWTH   Final    Report Status 05/15/2011 FINAL   Final     Studies/Results: Dg Chest 2 View  05/14/2011  *RADIOLOGY REPORT*  Clinical Data: Fever  CHEST  - 2 VIEW  Comparison: 05/03/2011  Findings: Both views degraded by patient positioning.  2 lateral views are essentially nondiagnostic.  The frontal view is degraded by AP portable positioning.  The patient is minimally rotated.  Mild cardiomegaly.  No definite pleural effusion. No pneumothorax. Low lung volumes with resultant pulmonary interstitial prominence. Limited evaluation of the left lung base secondary to overlying soft tissues.  IMPRESSION:  1. Mildly degraded exam (primarily lateral view), secondary to patient positioning. 2.  Cardiomegaly and low lung volumes, without definite acute process.  Original Report Authenticated By: Consuello Bossier, M.D.    Medications: I have reviewed the patient's current medications.   Patient Active Hospital Problem List:  Encephalopathy: Unclear etiology, delirium ?? , last pain medication received was yesterday. I will get ABG check for hypercapnia. Will check CBG. Work up for infection in process. I will hold sedative. Less likely secondary to uremia. Doubt secondary to hyponatremia. NPO for now to avoid aspiration.  DIABETES MELLITUS (03/04/2008) I will discontinue Glimepiride. SSI as needed.  Acute renal failure (05/14/2011): Urine Sodium 40 not consistent with dehydration. Fena less than 1. Renal failure could be  Secondary to ATN, vs nephritis. I will check [place]. Continue to hold cozaar, HCTZ.. I will consult renal.   Hyponatremia (05/14/2011) Stable.   Thrombocytopenia (05/14/2011): Platelet increased. Continue to monitor. Consider discontinue Lovenox and coumadin.    Fever (05/14/2011): Chest xray 11-24 negative for PNA. Blood culture times 2 no growth. UA few bacteria. Zosyn added 11-24. I will repeat chest xray 11-25.     Anemia (05/14/2011): post surgery. Received 2 units 11-23. Two more units today, will give 20 mg iv lasix  in between transfusion.Marland Kitchen Spoke with Dr Lequita Halt, he think patient decrease hb probably post surgery. I will hold on  getting Ct abdomen rule retroperitoneal bleed. I will check Guaiac stool. Anemia panel. Will stop Lovenox.         LOS: 4 days   Yuma Pacella M.D.  [facility] 05/15/2011, 10:28 AM

## 2011-05-16 LAB — FOLATE: Folate: 14.8 ng/mL

## 2011-05-16 LAB — CBC
Hemoglobin: 9.7 g/dL — ABNORMAL LOW (ref 13.0–17.0)
MCHC: 35.1 g/dL (ref 30.0–36.0)
Platelets: 125 10*3/uL — ABNORMAL LOW (ref 150–400)
RDW: 14.6 % (ref 11.5–15.5)

## 2011-05-16 LAB — PROTIME-INR: INR: 1.7 — ABNORMAL HIGH (ref 0.00–1.49)

## 2011-05-16 LAB — IRON AND TIBC: UIBC: 186 ug/dL (ref 125–400)

## 2011-05-16 LAB — TYPE AND SCREEN
ABO/RH(D): O NEG
Unit division: 0

## 2011-05-16 LAB — FERRITIN: Ferritin: 361 ng/mL — ABNORMAL HIGH (ref 22–322)

## 2011-05-16 LAB — GLUCOSE, CAPILLARY
Glucose-Capillary: 140 mg/dL — ABNORMAL HIGH (ref 70–99)
Glucose-Capillary: 153 mg/dL — ABNORMAL HIGH (ref 70–99)

## 2011-05-16 LAB — RENAL FUNCTION PANEL
Albumin: 2.2 g/dL — ABNORMAL LOW (ref 3.5–5.2)
GFR calc Af Amer: 32 mL/min — ABNORMAL LOW (ref >90)
GFR calc non Af Amer: 27 mL/min — ABNORMAL LOW (ref >90)
Phosphorus: 3.4 mg/dL (ref 2.3–4.6)
Potassium: 3.8 mEq/L (ref 3.5–5.1)
Sodium: 131 mEq/L — ABNORMAL LOW (ref 135–145)

## 2011-05-16 LAB — OCCULT BLOOD X 1 CARD TO LAB, STOOL: Fecal Occult Bld: NEGATIVE

## 2011-05-16 LAB — HEMOGLOBIN AND HEMATOCRIT, BLOOD: Hemoglobin: 9.4 g/dL — ABNORMAL LOW (ref 13.0–17.0)

## 2011-05-16 LAB — RETICULOCYTES
RBC.: 3.16 MIL/uL — ABNORMAL LOW (ref 4.22–5.81)
Retic Count, Absolute: 82.2 10*3/uL (ref 19.0–186.0)

## 2011-05-16 MED ORDER — INSULIN ASPART 100 UNIT/ML ~~LOC~~ SOLN
0.0000 [IU] | SUBCUTANEOUS | Status: DC
Start: 2011-05-16 — End: 2011-05-16
  Administered 2011-05-16: 2 [IU] via SUBCUTANEOUS

## 2011-05-16 MED ORDER — PIPERACILLIN-TAZOBACTAM 3.375 G IVPB
3.3750 g | Freq: Three times a day (TID) | INTRAVENOUS | Status: DC
  Administered 2011-05-16 – 2011-05-17 (×4): 3.375 g via INTRAVENOUS
  Filled 2011-05-16 (×6): qty 50

## 2011-05-16 MED ORDER — WARFARIN SODIUM 7.5 MG PO TABS
7.5000 mg | ORAL_TABLET | Freq: Once | ORAL | Status: AC
  Administered 2011-05-16: 7.5 mg via ORAL
  Filled 2011-05-16: qty 1

## 2011-05-16 MED ORDER — INSULIN ASPART 100 UNIT/ML ~~LOC~~ SOLN
0.0000 [IU] | Freq: Three times a day (TID) | SUBCUTANEOUS | Status: DC
  Administered 2011-05-16: 5 [IU] via SUBCUTANEOUS
  Administered 2011-05-17 (×2): 3 [IU] via SUBCUTANEOUS
  Administered 2011-05-18: 5 [IU] via SUBCUTANEOUS
  Administered 2011-05-18 – 2011-05-19 (×2): 3 [IU] via SUBCUTANEOUS
  Filled 2011-05-16: qty 3

## 2011-05-16 MED ORDER — FERUMOXYTOL INJECTION 510 MG/17 ML
510.0000 mg | Freq: Once | INTRAVENOUS | Status: AC
  Administered 2011-05-16: 510 mg via INTRAVENOUS
  Filled 2011-05-16: qty 17

## 2011-05-16 NOTE — Progress Notes (Signed)
CSW assisting with D/C planning to SNF. [facility] has accepted pt ( 1st choice). SNF is able to work with pt/family regarding payment plan for any out of pocket expenses. Will coordinate D/c to SNF when pt is stable.

## 2011-05-16 NOTE — Progress Notes (Signed)
Physical Therapy Treatment Patient Details Name: Preston Carter MRN: [id number] DOB: 02/14/1932 Today's Date: 05/16/2011 [id number] 3TA PT Assessment/Plan  PT - Assessment/Plan Comments on Treatment Session: pt states pain is "OK", doesn't want pain meds, RN present and aware PT Plan: Discharge plan remains appropriate;Frequency needs to be updated PT Frequency: Min 5X/week Follow Up Recommendations: Skilled nursing facility Equipment Recommended: Defer to next venue PT Goals   pt progressing slowly due to inability to maintain TDWB/decreased UB functional strength, medical issues/co-morbidities. Will continue to follow.  PT Treatment Precautions/Restrictions  Precautions Precautions: Posterior Hip Precaution Comments: pt requires constant cues for precautions, unable to recall d/t  STM deficits Required Braces or Orthoses: No Restrictions Weight Bearing Restrictions: Yes RLE Weight Bearing: Touchdown weight bearing (pt unable to maintain TDWB) Mobility (including Balance) Bed Mobility Supine to Sit: 1: +2 Total assist;Patient percentage (comment);HOB elevated (Comment degrees) Supine to Sit Details (indicate cue type and reason): pt=20%; +2 for UB/LB, THP, safety, HOB at 65degrees Sitting - Scoot to Edge of Bed: 1: +2 Total assist Sitting - Scoot to Edge of Bed Details (indicate cue type and reason): pt= 25%; +2 for scooting, balance, safety Transfers Sit to Stand: 1: +2 Total assist Sit to Stand Details (indicate cue type and reason): pt=25%; +2 for wt shift, safety, balance, TDWB. unable to maintain TDWB, pt with difficulty coming to full stand Stand to Sit: 1: +2 Total assist, requires increased time Stand to Sit Details: as above Stand Pivot Transfer Details (indicate cue type and reason): deferred/ for pt safety used maxi sky bed t-->3n1-->recliner. Ambulation/Gait Ambulation/Gait:  (unable)    Exercise  Total Joint Exercises Ankle Circles/Pumps: AAROM;Both;10  reps;Supine Quad Sets: AROM;5 reps;Right;Supine Heel Slides: AAROM;PROM;Both;10 reps;Supine End of Session PT - End of Session Equipment Utilized During Treatment: Gait belt (maxisky) Activity Tolerance: Patient tolerated treatment well;Patient limited by fatigue (more alert today) Patient left: in chair;with call bell in reach Nurse Communication: Mobility status for transfers;Need for lift equipment General Behavior During Session:  (at baseline mentation) Cognition: Impaired, at baseline  [facility] 05/16/2011, 11:24 AM

## 2011-05-16 NOTE — Progress Notes (Addendum)
S:says "if I told you I was finer than a frog's hair split 3 ways you would know I was lying - so there it is" Denies SOB.  OOB in the chair - no appetite for lunch O:  Scheduled Medications:    . antiseptic oral rinse  15 mL Mouth Rinse BID  . carbidopa-levodopa  1 tablet Oral TID  . docusate sodium  100 mg Oral BID  . donepezil  5 mg Oral QHS  . entacapone  200 mg Oral TID  . fluticasone  1 spray Each Nare Daily  . insulin aspart  0-15 Units Subcutaneous Q4H  . loratadine  10 mg Oral Daily  . niacin  500 mg Oral BID WC  . piperacillin-tazobactam (ZOSYN)  IV  3.375 g Intravenous Q8H  . polyethylene glycol  17 g Oral BID  . simvastatin  40 mg Oral QHS  . warfarin  7.5 mg Oral ONCE-1800  . warfarin  7.5 mg Oral ONCE-1800  . DISCONTD: insulin aspart  0-15 Units Subcutaneous Q4H  . DISCONTD: piperacillin-tazobactam (ZOSYN)  IV  2.25 g Intravenous Q8H   PRN Meds:.acetaminophen, acetaminophen, albuterol, bisacodyl, bisacodyl, ipratropium, ketoconazole, magnesium hydroxide, menthol-cetylpyridinium, olopatadine, ondansetron (ZOFRAN) IV, ondansetron, phenol  BP 151/51  Pulse 74  Temp(Src) 98.3 F (36.8 C) (Oral)  Resp 18  Ht 5\' 6"  (1.676 m)  Wt 135 kg (297 lb 9.9 oz)  BMI 48.04 kg/m2  SpO2 92%   Intake/Output Summary (Last 24 hours) at 05/16/11 1539 Last data filed at 05/16/11 1115  Gross per 24 hour  Intake 2104.16 ml  Output   1904 ml  Net 200.16 ml    Weight change: no weights recorded  EXAM: OVF:IEPP bobbing sort of tremor when he speaks; Morbidly obese WM.  NAD.  OX3 IRJ:JOACZYS S1S2 No S3 Resp:Breath sounds diminished but lungs overall clear AYT:KZSWF.  Large pannus.  No focal abdominal tenderness/ Ext:1-2+ edema R>L  Labs: Basic Metabolic Panel:  Lab 05/16/11 0932 05/15/11 0438 05/14/11 0402  NA 131* 131* 129*  K 3.8 3.6 3.5  CL 94* 95* 95*  CO2 26 25 26   GLUCOSE 135* 103* 187*  BUN 44* 36* 28*  CREATININE 2.19* 2.31* 1.94*  CALCIUM 8.3* 8.1* 8.0*  MG  -- -- --  PHOS 3.4 2.7 --    Liver Function Tests:  Lab 05/16/11 0441 05/15/11 0438  AST -- 69*  ALT -- <5  ALKPHOS -- 46  BILITOT -- 0.5  PROT -- 5.5*  ALBUMIN 2.2* 2.2*2.2*   No results found for this basename: LIPASE:3,AMYLASE:3 in the last 168 hours No results found for this basename: AMMONIA:3 in the last 168 hours  CBC:  Lab 05/16/11 0740 05/16/11 0441 05/15/11 2310 05/15/11 0438 05/14/11 0402 05/13/11 0355 05/12/11 0404  WBC -- 10.0 -- 10.9* 10.0 -- --  NEUTROABS -- -- -- -- -- -- --  HGB 9.4* 9.7* 9.4* -- -- -- --  HCT 27.0* 27.6* 27.2* -- -- -- --  MCV -- 87.3 -- 88.7 88.1 90.7 90.5  PLT -- 125* -- 108* 87* -- --   Hb 13.1 on 05/03/11  Cardiac Enzymes: No results found for this basename: CKTOTAL:5,CKMB:5,CKMBINDEX:5,TROPONINI:5 in the last 168 hours  CBG:  Lab 05/16/11 1131 05/16/11 0759 05/16/11 0533 05/16/11 0040 05/15/11 2019  GLUCAP 140* 153* 140* 145* 156*    Iron Studies:  Basename 05/16/11 0441  IRON 18*  TIBC 204*  TRANSFERRIN --  FERRITIN 361*    ABG    Component Value Date/Time  PHART 7.412 05/15/2011 1044   PCO2ART 40.0 05/15/2011 1044   PO2ART 65.6* 05/15/2011 1044   HCO3 25.0* 05/15/2011 1044   TCO2 22.8 05/15/2011 1044   O2SAT 93.4 05/15/2011 1044   Urine sodium 50 Fractional excretion of sodium 0.51%  . Studies/Results: US Renal Port  05/15/2011  *RADIOLOGY REPORT*  Clinical Data: Acute renal failure.  RENAL/URINARY TRACT ULTRASOUND COMPLETE  Comparison:  None.  Findings:  Right Kidney:  10.9 cm.  Increased cortical echotexture compatible with medical renal disease.  No hydronephrosis.  No obstruction.  Left Kidney:  10.4 cm.  Increased renal cortical echotexture compatible with medical renal disease.  No hydronephrosis or obstruction.  Bladder:  Decompressed with urinary bladder.  Obese body habitus degrades the study.  IMPRESSION: Echogenic renal cortex bilaterally without obstruction compatible with medical renal disease.   Original Report Authenticated By: Andreas Newport, M.D.   Dg Chest Port 1 View  05/15/2011  *RADIOLOGY REPORT*  Clinical Data: Hypoxemia.  PORTABLE CHEST - 1 VIEW  Comparison: 05/14/2011.  Findings: Mild pulmonary edema.  No segmental consolidation. Cardiomegaly.  No gross pneumothorax. Mildly tortuous aorta. Calcified aortic knob.  IMPRESSION: Mild pulmonary edema.  Cardiomegaly.  Original Report Authenticated By: Fuller Canada, M.D.   Assessment/Plan: 77yo wm who is s/p elective hip revision on 05/11/11 with post operative acute renal failure in the setting of fever, acute blood loss anemia, ARB and diuretic use.    1.  AKI - creatinine is actually starting to fall and patient is non-oliguric without diuretics.  Fractional sodium excretion of 0.51 actually pushes one more toward dx of pre-renal/hemodynamic AKI (precipitated by acute anemia, compounded by ARB therapy).  Would allow him to continue to diurese on his own for the moment (although may consider prn lasix dosing depending on I/O next 24 hours. Keep off ARB's.  2.  Anemia - blood loss plus iron deficiency.   Will give dose of Feraheme to replete iron.   3.  DM per primary 4.  Hyponatremia stable 5.  Fever - cultures negative to date; on empiric antibiotics 6.  Hip revision - per ortho  Will continue to follow. [place]i Marsico B

## 2011-05-16 NOTE — Progress Notes (Signed)
Subjective: 5 Days Post-Op Procedure(s) (LRB): TOTAL HIP REVISION (Right) Patient reports pain as moderate.   Patient has complaints of pain in right thigh and difficulty sleeping. Pain is starting to decrease.  Objective: Vital signs in last 24 hours: Temp:  [98.1 F (36.7 C)-100 F (37.8 C)] 99.6 F (37.6 C) (11/25 2125) Pulse Rate:  [68-83] 72  (11/25 2125) Resp:  [18-24] 20  (11/25 2125) BP: (94-131)/(53-70) 100/65 mmHg (11/25 2125) SpO2:  [98 %-100 %] 100 % (11/25 2125) Weight:  [134.9 kg (297 lb 6.4 oz)] 297 lb 6.4 oz (134.9 kg) (11/25 2005)  Intake/Output from previous day:  Intake/Output Summary (Last 24 hours) at 05/16/11 0626 Last data filed at 05/16/11 0305  Gross per 24 hour  Intake 2571.66 ml  Output    953 ml  Net 1618.66 ml    Intake/Output this shift: Total I/O In: 823.3 [P.O.:420; Blood:303.3; IV Piggyback:100] Out: 603 [Urine:600; Stool:3]  Labs: Results for orders placed during the hospital encounter of 05/11/11  TYPE AND SCREEN      Component Value Range   ABO/RH(D) O NEG     Antibody Screen NEG     Sample Expiration 05/14/2011     Unit Number [id number]     Blood Component Type RBC LR PHER1     Unit division 00     Status of Unit ISSUED,FINAL     Transfusion Status OK TO TRANSFUSE     Crossmatch Result Compatible     Unit Number [id number]     Blood Component Type RBC LR PHER1     Unit division 00     Status of Unit ISSUED,FINAL     Transfusion Status OK TO TRANSFUSE     Crossmatch Result Compatible    GLUCOSE, CAPILLARY      Component Value Range   Glucose-Capillary 120 (*) 70 - 99 (mg/dL)  ABO/RH      Component Value Range   ABO/RH(D) O NEG    GLUCOSE, CAPILLARY      Component Value Range   Glucose-Capillary 156 (*) 70 - 99 (mg/dL)   Comment 1 Documented in Chart     Comment 2 Notify RN    PROTIME-INR      Component Value Range   Prothrombin Time 14.6  11.6 - 15.2 (seconds)   INR 1.12  0.00 - 1.49   CBC      Component Value Range    WBC 8.0  4.0 - 10.5 (K/uL)   RBC 2.94 (*) 4.22 - 5.81 (MIL/uL)   Hemoglobin 9.2 (*) 13.0 - 17.0 (g/dL)   HCT 78.2 (*) 95.6 - 52.0 (%)   MCV 90.5  78.0 - 100.0 (fL)   MCH 31.3  26.0 - 34.0 (pg)   MCHC 34.6  30.0 - 36.0 (g/dL)   RDW 21.3  08.6 - 57.8 (%)   Platelets 134 (*) 150 - 400 (K/uL)  BASIC METABOLIC PANEL      Component Value Range   Sodium 133 (*) 135 - 145 (mEq/L)   Potassium 4.0  3.5 - 5.1 (mEq/L)   Chloride 98  96 - 112 (mEq/L)   CO2 26  19 - 32 (mEq/L)   Glucose, Bld 178 (*) 70 - 99 (mg/dL)   BUN 25 (*) 6 - 23 (mg/dL)   Creatinine, Ser 4.69  0.50 - 1.35 (mg/dL)   Calcium 8.2 (*) 8.4 - 10.5 (mg/dL)   GFR calc non Af Amer 52 (*) >90 (mL/min)   GFR calc Af Amer 61 (*) >  90 (mL/min)  GLUCOSE, CAPILLARY      Component Value Range   Glucose-Capillary 170 (*) 70 - 99 (mg/dL)  GLUCOSE, CAPILLARY      Component Value Range   Glucose-Capillary 178 (*) 70 - 99 (mg/dL)  GLUCOSE, CAPILLARY      Component Value Range   Glucose-Capillary 172 (*) 70 - 99 (mg/dL)   Comment 1 Notify RN    PROTIME-INR      Component Value Range   Prothrombin Time 16.3 (*) 11.6 - 15.2 (seconds)   INR 1.29  0.00 - 1.49   CBC      Component Value Range   WBC 8.8  4.0 - 10.5 (K/uL)   RBC 2.59 (*) 4.22 - 5.81 (MIL/uL)   Hemoglobin 8.1 (*) 13.0 - 17.0 (g/dL)   HCT 21.3 (*) 08.6 - 52.0 (%)   MCV 90.7  78.0 - 100.0 (fL)   MCH 31.3  26.0 - 34.0 (pg)   MCHC 34.5  30.0 - 36.0 (g/dL)   RDW 57.8  46.9 - 62.9 (%)   Platelets 107 (*) 150 - 400 (K/uL)  BASIC METABOLIC PANEL      Component Value Range   Sodium 132 (*) 135 - 145 (mEq/L)   Potassium 3.3 (*) 3.5 - 5.1 (mEq/L)   Chloride 97  96 - 112 (mEq/L)   CO2 28  19 - 32 (mEq/L)   Glucose, Bld 150 (*) 70 - 99 (mg/dL)   BUN 25 (*) 6 - 23 (mg/dL)   Creatinine, Ser 5.28 (*) 0.50 - 1.35 (mg/dL)   Calcium 8.2 (*) 8.4 - 10.5 (mg/dL)   GFR calc non Af Amer 35 (*) >90 (mL/min)   GFR calc Af Amer 40 (*) >90 (mL/min)  GLUCOSE, CAPILLARY      Component  Value Range   Glucose-Capillary 162 (*) 70 - 99 (mg/dL)  GLUCOSE, CAPILLARY      Component Value Range   Glucose-Capillary 196 (*) 70 - 99 (mg/dL)  GLUCOSE, CAPILLARY      Component Value Range   Glucose-Capillary 176 (*) 70 - 99 (mg/dL)  PREPARE RBC (CROSSMATCH)      Component Value Range   Order Confirmation ORDER PROCESSED BY BLOOD BANK    GLUCOSE, CAPILLARY      Component Value Range   Glucose-Capillary 201 (*) 70 - 99 (mg/dL)  GLUCOSE, CAPILLARY      Component Value Range   Glucose-Capillary 122 (*) 70 - 99 (mg/dL)  PROTIME-INR      Component Value Range   Prothrombin Time 16.6 (*) 11.6 - 15.2 (seconds)   INR 1.32  0.00 - 1.49   CBC      Component Value Range   WBC 10.0  4.0 - 10.5 (K/uL)   RBC 2.86 (*) 4.22 - 5.81 (MIL/uL)   Hemoglobin 8.8 (*) 13.0 - 17.0 (g/dL)   HCT 41.3 (*) 24.4 - 52.0 (%)   MCV 88.1  78.0 - 100.0 (fL)   MCH 30.8  26.0 - 34.0 (pg)   MCHC 34.9  30.0 - 36.0 (g/dL)   RDW 01.0  27.2 - 53.6 (%)   Platelets 87 (*) 150 - 400 (K/uL)  BASIC METABOLIC PANEL      Component Value Range   Sodium 129 (*) 135 - 145 (mEq/L)   Potassium 3.5  3.5 - 5.1 (mEq/L)   Chloride 95 (*) 96 - 112 (mEq/L)   CO2 26  19 - 32 (mEq/L)   Glucose, Bld 187 (*) 70 - 99 (mg/dL)  BUN 28 (*) 6 - 23 (mg/dL)   Creatinine, Ser 1.61 (*) 0.50 - 1.35 (mg/dL)   Calcium 8.0 (*) 8.4 - 10.5 (mg/dL)   GFR calc non Af Amer 32 (*) >90 (mL/min)   GFR calc Af Amer 37 (*) >90 (mL/min)  GLUCOSE, CAPILLARY      Component Value Range   Glucose-Capillary 173 (*) 70 - 99 (mg/dL)  GLUCOSE, CAPILLARY      Component Value Range   Glucose-Capillary 174 (*) 70 - 99 (mg/dL)  PRO B NATRIURETIC PEPTIDE      Component Value Range   BNP, POC 1672.0 (*) 0 - 450 (pg/mL)  URINALYSIS, ROUTINE W REFLEX MICROSCOPIC      Component Value Range   Color, Urine AMBER (*) YELLOW    Appearance CLOUDY (*) CLEAR    Specific Gravity, Urine 1.020  1.005 - 1.030    pH 5.5  5.0 - 8.0    Glucose, UA NEGATIVE  NEGATIVE  (mg/dL)   Hgb urine dipstick LARGE (*) NEGATIVE    Bilirubin Urine NEGATIVE  NEGATIVE    Ketones, ur NEGATIVE  NEGATIVE (mg/dL)   Protein, ur 096 (*) NEGATIVE (mg/dL)   Urobilinogen, UA 0.2  0.0 - 1.0 (mg/dL)   Nitrite NEGATIVE  NEGATIVE    Leukocytes, UA SMALL (*) NEGATIVE   CREATININE, URINE, RANDOM      Component Value Range   Creatinine, Urine 163.6    NA AND K (SODIUM & POTASSIUM), RAND UR      Component Value Range   Sodium, Ur 50     Potassium Urine Timed 26    URINE CULTURE      Component Value Range   Specimen Description URINE, CATHETERIZED     Special Requests NONE     Setup Time [id number]     Colony Count NO GROWTH     Culture NO GROWTH     Report Status 05/15/2011 FINAL    HEMOGLOBIN AND HEMATOCRIT, BLOOD      Component Value Range   Hemoglobin 9.1 (*) 13.0 - 17.0 (g/dL)   HCT 78.2 (*) 95.6 - 52.0 (%)  OSMOLALITY, URINE      Component Value Range   Osmolality, Ur 471  390 - 1090 (mOsm/kg)  URINE MICROSCOPIC-ADD ON      Component Value Range   Squamous Epithelial / LPF RARE  RARE    WBC, UA 3-6  <3 (WBC/hpf)   RBC / HPF 3-6  <3 (RBC/hpf)   Bacteria, UA MANY (*) RARE   GLUCOSE, CAPILLARY      Component Value Range   Glucose-Capillary 147 (*) 70 - 99 (mg/dL)  CULTURE, BLOOD (ROUTINE X 2)      Component Value Range   Specimen Description BLOOD RIGHT HAND  10 ML IN [facility] BOTTLE     Special Requests NONE     Setup Time [id number]     Culture       Value:        BLOOD CULTURE RECEIVED NO GROWTH TO DATE CULTURE WILL BE HELD FOR 5 DAYS BEFORE ISSUING A FINAL NEGATIVE REPORT   Report Status PENDING    CULTURE, BLOOD (ROUTINE X 2)      Component Value Range   Specimen Description BLOOD RIGHT ARM  10 ML IN [facility] BOTTLE     Special Requests NONE     Setup Time [id number]     Culture       Value:        BLOOD  CULTURE RECEIVED NO GROWTH TO DATE CULTURE WILL BE HELD FOR 5 DAYS BEFORE ISSUING A FINAL NEGATIVE REPORT   Report Status PENDING    GLUCOSE, CAPILLARY       Component Value Range   Glucose-Capillary 200 (*) 70 - 99 (mg/dL)  PROTIME-INR      Component Value Range   Prothrombin Time 18.6 (*) 11.6 - 15.2 (seconds)   INR 1.52 (*) 0.00 - 1.49   CBC      Component Value Range   WBC 10.9 (*) 4.0 - 10.5 (K/uL)   RBC 2.66 (*) 4.22 - 5.81 (MIL/uL)   Hemoglobin 8.3 (*) 13.0 - 17.0 (g/dL)   HCT 21.3 (*) 08.6 - 52.0 (%)   MCV 88.7  78.0 - 100.0 (fL)   MCH 31.2  26.0 - 34.0 (pg)   MCHC 35.2  30.0 - 36.0 (g/dL)   RDW 57.8  46.9 - 62.9 (%)   Platelets 108 (*) 150 - 400 (K/uL)  RENAL FUNCTION PANEL      Component Value Range   Sodium 131 (*) 135 - 145 (mEq/L)   Potassium 3.6  3.5 - 5.1 (mEq/L)   Chloride 95 (*) 96 - 112 (mEq/L)   CO2 25  19 - 32 (mEq/L)   Glucose, Bld 103 (*) 70 - 99 (mg/dL)   BUN 36 (*) 6 - 23 (mg/dL)   Creatinine, Ser 5.28 (*) 0.50 - 1.35 (mg/dL)   Calcium 8.1 (*) 8.4 - 10.5 (mg/dL)   Phosphorus 2.7  2.3 - 4.6 (mg/dL)   Albumin 2.2 (*) 3.5 - 5.2 (g/dL)   GFR calc non Af Amer 26 (*) >90 (mL/min)   GFR calc Af Amer 30 (*) >90 (mL/min)  HEPATIC FUNCTION PANEL      Component Value Range   Total Protein 5.5 (*) 6.0 - 8.3 (g/dL)   Albumin 2.2 (*) 3.5 - 5.2 (g/dL)   AST 69 (*) 0 - 37 (U/L)   ALT <5  0 - 53 (U/L)   Alkaline Phosphatase 46  39 - 117 (U/L)   Total Bilirubin 0.5  0.3 - 1.2 (mg/dL)   Bilirubin, Direct 0.2  0.0 - 0.3 (mg/dL)   Indirect Bilirubin 0.3  0.3 - 0.9 (mg/dL)  HEMOGLOBIN AND HEMATOCRIT, BLOOD      Component Value Range   Hemoglobin 8.1 (*) 13.0 - 17.0 (g/dL)   HCT 41.3 (*) 24.4 - 52.0 (%)  GLUCOSE, CAPILLARY      Component Value Range   Glucose-Capillary 122 (*) 70 - 99 (mg/dL)   Comment 1 Documented in Chart     Comment 2 Notify RN    HEMOGLOBIN AND HEMATOCRIT, BLOOD      Component Value Range   Hemoglobin 9.4 (*) 13.0 - 17.0 (g/dL)   HCT 01.0 (*) 27.2 - 52.0 (%)  GLUCOSE, CAPILLARY      Component Value Range   Glucose-Capillary 113 (*) 70 - 99 (mg/dL)  PREPARE RBC (CROSSMATCH)       Component Value Range   Order Confirmation ORDER PROCESSED BY BLOOD BANK    TYPE AND SCREEN      Component Value Range   ABO/RH(D) O NEG     Antibody Screen NEG     Sample Expiration 05/18/2011     Unit Number [id number]     Blood Component Type RBC LR PHER1     Unit division 00     Status of Unit ISSUED     Transfusion Status OK TO TRANSFUSE  Crossmatch Result Compatible     Unit Number [id number]     Blood Component Type RED CELLS,LR     Unit division 00     Status of Unit ISSUED     Transfusion Status OK TO TRANSFUSE     Crossmatch Result Compatible    BLOOD GAS, ARTERIAL      Component Value Range   O2 Content 2.0     Delivery systems NASAL CANNULA     pH, Arterial 7.412  7.350 - 7.450    pCO2 arterial 40.0  35.0 - 45.0 (mmHg)   pO2, Arterial 65.6 (*) 80.0 - 100.0 (mmHg)   Bicarbonate 25.0 (*) 20.0 - 24.0 (mEq/L)   TCO2 22.8  0 - 100 (mmol/L)   Acid-Base Excess 0.9  0.0 - 2.0 (mmol/L)   O2 Saturation 93.4     Patient temperature 98.6     Collection site RIGHT RADIAL     Drawn by [id number]     Sample type ARTERIAL DRAW     Allens test (pass/fail) PASS  PASS   OCCULT BLOOD X 1 CARD TO LAB, STOOL      Component Value Range   Fecal Occult Bld NEGATIVE    GLUCOSE, CAPILLARY      Component Value Range   Glucose-Capillary 165 (*) 70 - 99 (mg/dL)  GLUCOSE, CAPILLARY      Component Value Range   Glucose-Capillary 146 (*) 70 - 99 (mg/dL)   Comment 1 Notify RN    PROTIME-INR      Component Value Range   Prothrombin Time 20.3 (*) 11.6 - 15.2 (seconds)   INR 1.70 (*) 0.00 - 1.49   RETICULOCYTES      Component Value Range   Retic Ct Pct 2.6  0.4 - 3.1 (%)   RBC. 3.16 (*) 4.22 - 5.81 (MIL/uL)   Retic Count, Manual 82.2  19.0 - 186.0 (K/uL)  CBC      Component Value Range   WBC 10.0  4.0 - 10.5 (K/uL)   RBC 3.16 (*) 4.22 - 5.81 (MIL/uL)   Hemoglobin 9.7 (*) 13.0 - 17.0 (g/dL)   HCT 62.9 (*) 52.8 - 52.0 (%)   MCV 87.3  78.0 - 100.0 (fL)   MCH 30.7  26.0 - 34.0 (pg)    MCHC 35.1  30.0 - 36.0 (g/dL)   RDW 41.3  24.4 - 01.0 (%)   Platelets 125 (*) 150 - 400 (K/uL)  RENAL FUNCTION PANEL      Component Value Range   Sodium 131 (*) 135 - 145 (mEq/L)   Potassium 3.8  3.5 - 5.1 (mEq/L)   Chloride 94 (*) 96 - 112 (mEq/L)   CO2 26  19 - 32 (mEq/L)   Glucose, Bld 135 (*) 70 - 99 (mg/dL)   BUN 44 (*) 6 - 23 (mg/dL)   Creatinine, Ser 2.72 (*) 0.50 - 1.35 (mg/dL)   Calcium 8.3 (*) 8.4 - 10.5 (mg/dL)   Phosphorus 3.4  2.3 - 4.6 (mg/dL)   Albumin 2.2 (*) 3.5 - 5.2 (g/dL)   GFR calc non Af Amer 27 (*) >90 (mL/min)   GFR calc Af Amer 32 (*) >90 (mL/min)  GLUCOSE, CAPILLARY      Component Value Range   Glucose-Capillary 156 (*) 70 - 99 (mg/dL)   Comment 1 Documented in Chart     Comment 2 Notify RN    GLUCOSE, CAPILLARY      Component Value Range   Glucose-Capillary 145 (*) 70 - 99 (mg/dL)  Comment 1 Documented in Chart     Comment 2 Notify RN    GLUCOSE, CAPILLARY      Component Value Range   Glucose-Capillary 140 (*) 70 - 99 (mg/dL)   Comment 1 Documented in Chart     Comment 2 Notify RN      Exam - Neurologically intact Neurovascular intact Much more alert and conversive today.  Dressing/Incision - clean, dry, decreased serous drainage. No erythema Motor function intact - moving foot and toes well on exam.  Thigh swelling unchanged  Assessment/Plan: 5 Days Post-Op Procedure(s) (LRB): TOTAL HIP REVISION (Right)  Past Medical History  Diagnosis Date  . Hypertension   . Stroke 05-03-11    TIA- more than 20 yrs ago.  . Asthma   . Shortness of breath 05-03-11    SOB with exertion only  . Diabetes mellitus 05-03-11    Oral meds used-Dabetes x5 yrs-CBG's (145)  . Neuromuscular disorder 05-03-11    Dx. Parkinson's -essential tremors-generally, ambulates with walker  . Arthritis 05-03-11    most joints- Bil. hips/knees replaced in the past  . Skin cancer 05-03-11    Past hx. skin cancer lesions-removed  . DEMENTIA 05-03-11    hx.  Alzheimers-tx. Aricept    Up with therapy Hemoglobin improved post-transfusion. Repeat CBC 11/27 SNF when medically ready Renal function appears to be stabilizing. Repeat BMET 11/27  DVT Prophylaxis  Coumadin Protocol TDWB 25-50% right Leg  Preston Carter V 05/16/2011, 6:26 AM

## 2011-05-16 NOTE — Progress Notes (Signed)
ANTICOAGULATION/ANTIBIOTIC CONSULT NOTE - Follow Up Consult  Pharmacy Consult for Coumadin Indication: VTE prophylaxis s/p right total hip revision Pharmacy Consult for Zosyn Indication: UTI  No Known Allergies  Patient Measurements: Height: 5\' 6"  (167.6 cm) Weight: 297 lb 9.9 oz (135 kg) IBW/kg (Calculated) : 63.8   Vital Signs: Temp: 99.4 F (37.4 C) (11/26 0540) Temp src: Oral (11/26 0540) BP: 114/63 mmHg (11/26 0540) Pulse Rate: 78  (11/26 0540)  Labs:  Basename 05/16/11 0441 05/15/11 2310 05/15/11 0901 05/15/11 0438 05/14/11 0402  HGB 9.7* 9.4* -- -- --  HCT 27.6* 27.2* 23.4* -- --  PLT 125* -- -- 108* 87*  APTT -- -- -- -- --  LABPROT 20.3* -- -- 18.6* 16.6*  INR 1.70* -- -- 1.52* 1.32  HEPARINUNFRC -- -- -- -- --  CREATININE 2.19* -- -- 2.31* 1.94*  CKTOTAL -- -- -- -- --  CKMB -- -- -- -- --  TROPONINI -- -- -- -- --   Estimated Creatinine Clearance: 36.9 ml/min (by C-G formula based on Cr of 2.19).   Medications:  Scheduled:     . acetaminophen  650 mg Oral Once  . antiseptic oral rinse  15 mL Mouth Rinse BID  . carbidopa-levodopa  1 tablet Oral TID  . docusate sodium  100 mg Oral BID  . donepezil  5 mg Oral QHS  . entacapone  200 mg Oral TID  . fluticasone  1 spray Each Nare Daily  . insulin aspart  0-15 Units Subcutaneous Q4H  . loratadine  10 mg Oral Daily  . niacin  500 mg Oral BID WC  . piperacillin-tazobactam (ZOSYN)  IV  3.375 g Intravenous Q8H  . polyethylene glycol  17 g Oral BID  . simvastatin  40 mg Oral QHS  . warfarin  7.5 mg Oral ONCE-1800  . DISCONTD: antiseptic oral rinse  15 mL Mouth Rinse BID  . DISCONTD: enoxaparin (LOVENOX) injection  40 mg Subcutaneous Q24H  . DISCONTD: furosemide  20 mg Intravenous Once  . DISCONTD: glimepiride  2 mg Oral Daily  . DISCONTD: insulin aspart  0-15 Units Subcutaneous TID WC  . DISCONTD: insulin aspart  0-15 Units Subcutaneous QID  . DISCONTD: piperacillin-tazobactam (ZOSYN)  IV  2.25 g  Intravenous Q8H  . DISCONTD: piperacillin-tazobactam (ZOSYN)  IV  2.25 g Intravenous Q8H  . DISCONTD: piperacillin-tazobactam (ZOSYN)  IV  3.375 g Intravenous Q8H  . DISCONTD: sodium chloride 0.9 % 50 mL with cefTRIAXone (ROCEPHIN) 1 g infusion   Intravenous Q24H  . DISCONTD: temazepam  30 mg Oral QHS  Anticoagulants: Lovenox 40mg  SQ daily Coumadin 7.5mg  on 11/21, 11/22, 11/23, 11/24,11/25 Antibiotics: D3 Zosyn 3.375 gm q8 (dose reduced to 2.25 gm q8 11/25)  Assessment: 77 yo male s/p R total hip revision started on coumadin 11/21. INR still subtherapeutic at 1.7 today, but increasing slowly. Started on broad spectrum antibiotics for possible sepsis/UTI. Urine Cx 11/24 No growth/final, Blood cx x 2 no growth Clearance > 20 ml/min Goal of Therapy:  INR 2-3  Antibiotic dose/schedule appropriate for renal fx.  Plan:  Continue Coumadin 7.5 mg PO x1 today Lovenox discontinued 11/25 Follow up daily PT/INR, SCr Coumadin education, will wait for spouse to teach. Plan SNF at discharge for rehab Continue Zosyn at 3.375 gm q8, with Cl > 20 ml/min

## 2011-05-16 NOTE — Progress Notes (Signed)
Subjective:  More awake. Had 2 BM yesterday. No complaints. Denies chest pain, dyspnea.  Objective: Filed Vitals:   05/15/11 2058 05/15/11 2125 05/16/11 0500 05/16/11 0540  BP: 127/70 100/65  114/63  Pulse: 77 72  78  Temp: 100 F (37.8 C) 99.6 F (37.6 C)  99.4 F (37.4 C)  TempSrc: Oral Oral  Oral  Resp: 18 20  18   Height:      Weight:   135 kg (297 lb 9.9 oz)   SpO2: 99% 100%  100%   Weight change:   Intake/Output Summary (Last 24 hours) at 05/16/11 0911 Last data filed at 05/16/11 0540  Gross per 24 hour  Intake 2451.66 ml  Output   1103 ml  Net 1348.66 ml    General: Alert, awake, oriented x3, in no acute distress.  HEENT: No bruits, no goiter.  Heart: Regular rate and rhythm, without murmurs, rubs, gallops.  Lungs: , bilateral air movement, no wheezes, positive ronchy.  Abdomen: Soft, nontender, nondistended, positive bowel sounds.  Neuro: Grossly intact, nonfocal. Extremities: right leg with edema. Right no significant swelling.    Lab Results:  [facility] 05/16/11 0441 05/15/11 0438  NA 131* 131*  K 3.8 3.6  CL 94* 95*  CO2 26 25  GLUCOSE 135* 103*  BUN 44* 36*  CREATININE 2.19* 2.31*  CALCIUM 8.3* 8.1*  MG -- --  PHOS 3.4 2.7    Basename 05/16/11 0441 05/15/11 0438  AST -- 69*  ALT -- <5  ALKPHOS -- 46  BILITOT -- 0.5  PROT -- 5.5*  ALBUMIN 2.2* 2.2*2.2*    Basename 05/16/11 0740 05/16/11 0441 05/15/11 0438  WBC -- 10.0 10.9*  NEUTROABS -- -- --  HGB 9.4* 9.7* --  HCT 27.0* 27.6* --  MCV -- 87.3 88.7  PLT -- 125* 108*     Basename 05/14/11 0402  POCBNP 1672.0*    Basename 05/16/11 0441  VITAMINB12 --  FOLATE --  FERRITIN --  TIBC --  IRON --  RETICCTPCT 2.6    Micro Results: Recent Results (from the past 240 hour(s))  URINE CULTURE     Status: Normal   Collection Time   05/14/11 10:00 AM      Component Value Range Status Comment   Specimen Description URINE, CATHETERIZED   Final    Special Requests NONE   Final    Setup Time [id number]   Final    Colony Count NO GROWTH   Final    Culture NO GROWTH   Final    Report Status 05/15/2011 FINAL   Final   CULTURE, BLOOD (ROUTINE X 2)     Status: Normal (Preliminary result)   Collection Time   05/14/11  1:56 PM      Component Value Range Status Comment   Specimen Description BLOOD RIGHT HAND  10 ML IN [facility] BOTTLE   Final    Special Requests NONE   Final    Setup Time [id number]   Final    Culture     Final    Value:        BLOOD CULTURE RECEIVED NO GROWTH TO DATE CULTURE WILL BE HELD FOR 5 DAYS BEFORE ISSUING A FINAL NEGATIVE REPORT   Report Status PENDING   Incomplete   CULTURE, BLOOD (ROUTINE X 2)     Status: Normal (Preliminary result)   Collection Time   05/14/11  3:11 PM      Component Value Range Status Comment   Specimen Description BLOOD RIGHT  ARM  10 ML IN [facility] BOTTLE   Final    Special Requests NONE   Final    Setup Time [id number]   Final    Culture     Final    Value:        BLOOD CULTURE RECEIVED NO GROWTH TO DATE CULTURE WILL BE HELD FOR 5 DAYS BEFORE ISSUING A FINAL NEGATIVE REPORT   Report Status PENDING   Incomplete     Studies/Results: Dg Chest 2 View  05/14/2011  *RADIOLOGY REPORT*  Clinical Data: Fever  CHEST - 2 VIEW  Comparison: 05/03/2011  Findings: Both views degraded by patient positioning.  2 lateral views are essentially nondiagnostic.  The frontal view is degraded by AP portable positioning.  The patient is minimally rotated.  Mild cardiomegaly.  No definite pleural effusion. No pneumothorax. Low lung volumes with resultant pulmonary interstitial prominence. Limited evaluation of the left lung base secondary to overlying soft tissues.  IMPRESSION:  1. Mildly degraded exam (primarily lateral view), secondary to patient positioning. 2.  Cardiomegaly and low lung volumes, without definite acute process.  Original Report Authenticated By: Consuello Bossier, M.D.   US Renal Port  05/15/2011  *RADIOLOGY REPORT*  Clinical Data:  Acute renal failure.  RENAL/URINARY TRACT ULTRASOUND COMPLETE  Comparison:  None.  Findings:  Right Kidney:  10.9 cm.  Increased cortical echotexture compatible with medical renal disease.  No hydronephrosis.  No obstruction.  Left Kidney:  10.4 cm.  Increased renal cortical echotexture compatible with medical renal disease.  No hydronephrosis or obstruction.  Bladder:  Decompressed with urinary bladder.  Obese body habitus degrades the study.  IMPRESSION: Echogenic renal cortex bilaterally without obstruction compatible with medical renal disease.  Original Report Authenticated By: Andreas Newport, M.D.   Dg Chest Port 1 View  05/15/2011  *RADIOLOGY REPORT*  Clinical Data: Hypoxemia.  PORTABLE CHEST - 1 VIEW  Comparison: 05/14/2011.  Findings: Mild pulmonary edema.  No segmental consolidation. Cardiomegaly.  No gross pneumothorax. Mildly tortuous aorta. Calcified aortic knob.  IMPRESSION: Mild pulmonary edema.  Cardiomegaly.  Original Report Authenticated By: Fuller Canada, M.D.    Medications: I have reviewed the patient's current medications.  Encephalopathy: Improved, more awake, following command. delirium vs secondary to Fever.  ABG no hypercapnia.  Continue to  hold sedative. More awake, will restart diet. Treat for infection.   DIABETES MELLITUS (03/04/2008) Continue to hold Glimepiride. SSI as needed.   Acute renal failure (05/14/2011): Urine Sodium 40 not consistent with dehydration. Fena less than 1. Renal failure could be Secondary to ATN,   Continue to hold cozaar, HCTZ.Marland Kitchen Appreciate Dr Web help. Renal US negative. Cr stable at 2.9. Good urine out put 1.L on 11-25. Chest xray mild pulmonary edema 11-26.  Hyponatremia (05/14/2011) Stable.   Thrombocytopenia (05/14/2011): Platelet increased to 125. Continue to monitor. Monitor for bleed.  Fever (05/14/2011): Chest xray 11-24 negative for PNA. Blood culture times 2 no growth. UA few bacteria. Zosyn added 11-24. I will treat for possible  aspiration.  T.max 100 last 24. Will consider transition to Augmentin 11-26 if afebrile.   Anemia (05/14/2011): post surgery. Received 2 units 11-23. Two more units 11-25.  Anemia panel pending. Lovenox stopped 11-25. HB stable in the 9 range. Fecal occult negative.  DVT prophylaxis: On coumadin.  Might be able to be discharge to SNF, when afebrile, and renal function stable.       LOS: 5 days   Dallen Bunte M.D.  [facility] 05/16/2011, 9:11 AM

## 2011-05-17 LAB — GLUCOSE, CAPILLARY: Glucose-Capillary: 164 mg/dL — ABNORMAL HIGH (ref 70–99)

## 2011-05-17 LAB — BASIC METABOLIC PANEL
CO2: 25 mEq/L (ref 19–32)
Calcium: 8 mg/dL — ABNORMAL LOW (ref 8.4–10.5)
Chloride: 95 mEq/L — ABNORMAL LOW (ref 96–112)
Creatinine, Ser: 1.91 mg/dL — ABNORMAL HIGH (ref 0.50–1.35)
Glucose, Bld: 166 mg/dL — ABNORMAL HIGH (ref 70–99)
Sodium: 131 mEq/L — ABNORMAL LOW (ref 135–145)

## 2011-05-17 LAB — CBC
Hemoglobin: 9.7 g/dL — ABNORMAL LOW (ref 13.0–17.0)
MCH: 30.4 pg (ref 26.0–34.0)
MCV: 88.4 fL (ref 78.0–100.0)
RBC: 3.19 MIL/uL — ABNORMAL LOW (ref 4.22–5.81)
WBC: 9.4 10*3/uL (ref 4.0–10.5)

## 2011-05-17 MED ORDER — FUROSEMIDE 10 MG/ML IJ SOLN
80.0000 mg | Freq: Once | INTRAMUSCULAR | Status: AC
  Administered 2011-05-17: 80 mg via INTRAVENOUS
  Filled 2011-05-17: qty 4

## 2011-05-17 MED ORDER — AMOXICILLIN-POT CLAVULANATE 875-125 MG PO TABS
1.0000 | ORAL_TABLET | Freq: Two times a day (BID) | ORAL | Status: DC
  Administered 2011-05-17 – 2011-05-19 (×4): 1 via ORAL
  Filled 2011-05-17 (×5): qty 1

## 2011-05-17 MED ORDER — WARFARIN SODIUM 7.5 MG PO TABS
7.5000 mg | ORAL_TABLET | Freq: Once | ORAL | Status: AC
  Administered 2011-05-17: 7.5 mg via ORAL
  Filled 2011-05-17: qty 1

## 2011-05-17 MED ORDER — FUROSEMIDE 40 MG PO TABS
40.0000 mg | ORAL_TABLET | Freq: Every day | ORAL | Status: DC
  Administered 2011-05-18 – 2011-05-19 (×2): 40 mg via ORAL
  Filled 2011-05-17 (×2): qty 1

## 2011-05-17 MED ORDER — VITAMINS A & D EX OINT
TOPICAL_OINTMENT | CUTANEOUS | Status: AC
  Administered 2011-05-17: 5
  Filled 2011-05-17: qty 5

## 2011-05-17 MED ORDER — TRAMADOL HCL 50 MG PO TABS
50.0000 mg | ORAL_TABLET | Freq: Four times a day (QID) | ORAL | Status: DC | PRN
  Administered 2011-05-17 – 2011-05-18 (×3): 50 mg via ORAL
  Filled 2011-05-17 (×4): qty 1

## 2011-05-17 NOTE — Progress Notes (Signed)
With NT, assisted pt back to bed via MaxiSky + 2 with increased time to position to comfort.  Spouse present during session and communicated to her about the PT am session and how the pt performed.  Pt unable to support self in standing while maintaining TTWB R LE despite X 2 attempts. Felecia Shelling PTA [facility]  Acute  Rehab Pager     [phone]

## 2011-05-17 NOTE — Progress Notes (Signed)
Physical Therapy Treatment Patient Details Name: Preston Carter MRN: [id number] DOB: 1931/06/25 Today's Date: 05/17/2011 9:55 - 10:30 2 ta PT Assessment/Plan  PT - Assessment/Plan Comments on Treatment Session: Pt required MAX encouragement to put fourth the effort PT Plan: Discharge plan remains appropriate PT Frequency: Min 5X/week Follow Up Recommendations: Skilled nursing facility Equipment Recommended: Defer to next venue PT Goals  Acute Rehab PT Goals PT Goal Formulation: With patient Pt will go Supine/Side to Sit: with mod assist PT Goal: Supine/Side to Sit - Progress: Progressing toward goal Pt will go Sit to Supine/Side: with mod assist PT Goal: Sit to Supine/Side - Progress: Progressing toward goal Pt will Transfer Sit to Stand/Stand to Sit: with mod assist PT Transfer Goal: Sit to Stand/Stand to Sit - Progress: Progressing toward goal Pt will Ambulate: 1 - 15 feet;with +2 total assist;with rolling walker (pt 60%) PT Goal: Ambulate - Progress: Progressing toward goal Pt will Perform Home Exercise Program: with supervision, verbal cues required/provided PT Goal: Perform Home Exercise Program - Progress: Progressing toward goal  PT Treatment Precautions/Restrictions  Precautions Precautions: Posterior Hip Precaution Comments: pt requires constant cues for precautions, unable to recall d/t  STM deficits Required Braces or Orthoses: No Restrictions Weight Bearing Restrictions: Yes RLE Weight Bearing: Touchdown weight bearing Mobility (including Balance) Bed Mobility Bed Mobility: Yes Rolling Right: 1: +2 Total assist Rolling Right Details (indicate cue type and reason): Total assist + 2 pt <25% with increased time and HOB increased 45' Supine to Sit: 1: +2 Total assist Supine to Sit Details (indicate cue type and reason): total assist +2 pt <25% with increased time Sitting - Scoot to Edge of Bed: 1: +2 Total assist Sitting - Scoot to Edge of Bed Details (indicate cue  type and reason): Total assist +2 pt <25% with increased time Transfers Transfers: Yes Sit to Stand Details (indicate cue type and reason): attempted sit to stand total assist +2 however pt unable to clear the bed Stand Pivot Transfer Details (indicate cue type and reason): attempted sit to stand however pt unable to clear his hips off the bed so used MaxSky to transfer pt from bed to the chair Ambulation/Gait Ambulation/Gait: No Stairs: No Wheelchair Mobility Wheelchair Mobility: No    Exercise  Other Exercises Other Exercises: static sitting EOB x 5 min at Mod Indep End of Session PT - End of Session Equipment Utilized During Treatment: Gait belt Activity Tolerance: Patient limited by fatigue;Patient limited by pain Patient left: in chair Nurse Communication: Need for lift equipment General Behavior During Session: [facility] for tasks performed Cognition: [facility] for tasks performed Felecia Shelling PTA [facility]  Acute  Rehab Pager     [phone]

## 2011-05-17 NOTE — Progress Notes (Signed)
Subjective: 6 Days Post-Op Procedure(s) (LRB): TOTAL HIP REVISION (Right)   Patient seen in rounds with Dr. Lequita Halt. Patient sleeping well. Arousable but doses right back off.  No apparent distress.  Plan is for SNF.  Hopefully will be able to go tomorrow since renal function appears to be stabilizing.  Will keep tody and maybe tomorrow if felt to be medically stable from Internal Medicine and Nephrology standpoint.    Objective: Vital signs in last 24 hours: Temp:  [98.3 F (36.8 C)-98.7 F (37.1 C)] 98.6 F (37 C) (11/27 0600) Pulse Rate:  [74-78] 78  (11/27 0600) Resp:  [18-19] 18  (11/27 0600) BP: (128-151)/(51-64) 128/62 mmHg (11/27 0600) SpO2:  [92 %-94 %] 94 % (11/27 0600) Weight:  [134.9 kg (297 lb 6.4 oz)] 297 lb 6.4 oz (134.9 kg) (11/26 1848)  Intake/Output from previous day:  Intake/Output Summary (Last 24 hours) at 05/17/11 0902 Last data filed at 05/17/11 0600  Gross per 24 hour  Intake 1193.89 ml  Output   1979 ml  Net -785.11 ml    Intake/Output this shift:    Labs: Results for orders placed during the hospital encounter of 05/11/11  TYPE AND SCREEN      Component Value Range   ABO/RH(D) O NEG     Antibody Screen NEG     Sample Expiration 05/14/2011     Unit Number [id number]     Blood Component Type RBC LR PHER1     Unit division 00     Status of Unit ISSUED,FINAL     Transfusion Status OK TO TRANSFUSE     Crossmatch Result Compatible     Unit Number [id number]     Blood Component Type RBC LR PHER1     Unit division 00     Status of Unit ISSUED,FINAL     Transfusion Status OK TO TRANSFUSE     Crossmatch Result Compatible    GLUCOSE, CAPILLARY      Component Value Range   Glucose-Capillary 120 (*) 70 - 99 (mg/dL)  ABO/RH      Component Value Range   ABO/RH(D) O NEG    GLUCOSE, CAPILLARY      Component Value Range   Glucose-Capillary 156 (*) 70 - 99 (mg/dL)   Comment 1 Documented in Chart     Comment 2 Notify RN    PROTIME-INR      Component  Value Range   Prothrombin Time 14.6  11.6 - 15.2 (seconds)   INR 1.12  0.00 - 1.49   CBC      Component Value Range   WBC 8.0  4.0 - 10.5 (K/uL)   RBC 2.94 (*) 4.22 - 5.81 (MIL/uL)   Hemoglobin 9.2 (*) 13.0 - 17.0 (g/dL)   HCT 78.2 (*) 95.6 - 52.0 (%)   MCV 90.5  78.0 - 100.0 (fL)   MCH 31.3  26.0 - 34.0 (pg)   MCHC 34.6  30.0 - 36.0 (g/dL)   RDW 21.3  08.6 - 57.8 (%)   Platelets 134 (*) 150 - 400 (K/uL)  BASIC METABOLIC PANEL      Component Value Range   Sodium 133 (*) 135 - 145 (mEq/L)   Potassium 4.0  3.5 - 5.1 (mEq/L)   Chloride 98  96 - 112 (mEq/L)   CO2 26  19 - 32 (mEq/L)   Glucose, Bld 178 (*) 70 - 99 (mg/dL)   BUN 25 (*) 6 - 23 (mg/dL)   Creatinine, Ser 4.69  0.50 -  1.35 (mg/dL)   Calcium 8.2 (*) 8.4 - 10.5 (mg/dL)   GFR calc non Af Amer 52 (*) >90 (mL/min)   GFR calc Af Amer 61 (*) >90 (mL/min)  GLUCOSE, CAPILLARY      Component Value Range   Glucose-Capillary 170 (*) 70 - 99 (mg/dL)  GLUCOSE, CAPILLARY      Component Value Range   Glucose-Capillary 178 (*) 70 - 99 (mg/dL)  GLUCOSE, CAPILLARY      Component Value Range   Glucose-Capillary 172 (*) 70 - 99 (mg/dL)   Comment 1 Notify RN    PROTIME-INR      Component Value Range   Prothrombin Time 16.3 (*) 11.6 - 15.2 (seconds)   INR 1.29  0.00 - 1.49   CBC      Component Value Range   WBC 8.8  4.0 - 10.5 (K/uL)   RBC 2.59 (*) 4.22 - 5.81 (MIL/uL)   Hemoglobin 8.1 (*) 13.0 - 17.0 (g/dL)   HCT 91.4 (*) 78.2 - 52.0 (%)   MCV 90.7  78.0 - 100.0 (fL)   MCH 31.3  26.0 - 34.0 (pg)   MCHC 34.5  30.0 - 36.0 (g/dL)   RDW 95.6  21.3 - 08.6 (%)   Platelets 107 (*) 150 - 400 (K/uL)  BASIC METABOLIC PANEL      Component Value Range   Sodium 132 (*) 135 - 145 (mEq/L)   Potassium 3.3 (*) 3.5 - 5.1 (mEq/L)   Chloride 97  96 - 112 (mEq/L)   CO2 28  19 - 32 (mEq/L)   Glucose, Bld 150 (*) 70 - 99 (mg/dL)   BUN 25 (*) 6 - 23 (mg/dL)   Creatinine, Ser 5.78 (*) 0.50 - 1.35 (mg/dL)   Calcium 8.2 (*) 8.4 - 10.5 (mg/dL)    GFR calc non Af Amer 35 (*) >90 (mL/min)   GFR calc Af Amer 40 (*) >90 (mL/min)  GLUCOSE, CAPILLARY      Component Value Range   Glucose-Capillary 162 (*) 70 - 99 (mg/dL)  GLUCOSE, CAPILLARY      Component Value Range   Glucose-Capillary 196 (*) 70 - 99 (mg/dL)  GLUCOSE, CAPILLARY      Component Value Range   Glucose-Capillary 176 (*) 70 - 99 (mg/dL)  PREPARE RBC (CROSSMATCH)      Component Value Range   Order Confirmation ORDER PROCESSED BY BLOOD BANK    GLUCOSE, CAPILLARY      Component Value Range   Glucose-Capillary 201 (*) 70 - 99 (mg/dL)  GLUCOSE, CAPILLARY      Component Value Range   Glucose-Capillary 122 (*) 70 - 99 (mg/dL)  PROTIME-INR      Component Value Range   Prothrombin Time 16.6 (*) 11.6 - 15.2 (seconds)   INR 1.32  0.00 - 1.49   CBC      Component Value Range   WBC 10.0  4.0 - 10.5 (K/uL)   RBC 2.86 (*) 4.22 - 5.81 (MIL/uL)   Hemoglobin 8.8 (*) 13.0 - 17.0 (g/dL)   HCT 46.9 (*) 62.9 - 52.0 (%)   MCV 88.1  78.0 - 100.0 (fL)   MCH 30.8  26.0 - 34.0 (pg)   MCHC 34.9  30.0 - 36.0 (g/dL)   RDW 52.8  41.3 - 24.4 (%)   Platelets 87 (*) 150 - 400 (K/uL)  BASIC METABOLIC PANEL      Component Value Range   Sodium 129 (*) 135 - 145 (mEq/L)   Potassium 3.5  3.5 - 5.1 (  mEq/L)   Chloride 95 (*) 96 - 112 (mEq/L)   CO2 26  19 - 32 (mEq/L)   Glucose, Bld 187 (*) 70 - 99 (mg/dL)   BUN 28 (*) 6 - 23 (mg/dL)   Creatinine, Ser 1.61 (*) 0.50 - 1.35 (mg/dL)   Calcium 8.0 (*) 8.4 - 10.5 (mg/dL)   GFR calc non Af Amer 32 (*) >90 (mL/min)   GFR calc Af Amer 37 (*) >90 (mL/min)  GLUCOSE, CAPILLARY      Component Value Range   Glucose-Capillary 173 (*) 70 - 99 (mg/dL)  GLUCOSE, CAPILLARY      Component Value Range   Glucose-Capillary 174 (*) 70 - 99 (mg/dL)  PRO B NATRIURETIC PEPTIDE      Component Value Range   BNP, POC 1672.0 (*) 0 - 450 (pg/mL)  URINALYSIS, ROUTINE W REFLEX MICROSCOPIC      Component Value Range   Color, Urine AMBER (*) YELLOW    Appearance CLOUDY  (*) CLEAR    Specific Gravity, Urine 1.020  1.005 - 1.030    pH 5.5  5.0 - 8.0    Glucose, UA NEGATIVE  NEGATIVE (mg/dL)   Hgb urine dipstick LARGE (*) NEGATIVE    Bilirubin Urine NEGATIVE  NEGATIVE    Ketones, ur NEGATIVE  NEGATIVE (mg/dL)   Protein, ur 096 (*) NEGATIVE (mg/dL)   Urobilinogen, UA 0.2  0.0 - 1.0 (mg/dL)   Nitrite NEGATIVE  NEGATIVE    Leukocytes, UA SMALL (*) NEGATIVE   CREATININE, URINE, RANDOM      Component Value Range   Creatinine, Urine 163.6    NA AND K (SODIUM & POTASSIUM), RAND UR      Component Value Range   Sodium, Ur 50     Potassium Urine Timed 26    URINE CULTURE      Component Value Range   Specimen Description URINE, CATHETERIZED     Special Requests NONE     Setup Time [id number]     Colony Count NO GROWTH     Culture NO GROWTH     Report Status 05/15/2011 FINAL    HEMOGLOBIN AND HEMATOCRIT, BLOOD      Component Value Range   Hemoglobin 9.1 (*) 13.0 - 17.0 (g/dL)   HCT 78.2 (*) 95.6 - 52.0 (%)  OSMOLALITY, URINE      Component Value Range   Osmolality, Ur 471  390 - 1090 (mOsm/kg)  URINE MICROSCOPIC-ADD ON      Component Value Range   Squamous Epithelial / LPF RARE  RARE    WBC, UA 3-6  <3 (WBC/hpf)   RBC / HPF 3-6  <3 (RBC/hpf)   Bacteria, UA MANY (*) RARE   GLUCOSE, CAPILLARY      Component Value Range   Glucose-Capillary 147 (*) 70 - 99 (mg/dL)  CULTURE, BLOOD (ROUTINE X 2)      Component Value Range   Specimen Description BLOOD RIGHT HAND  10 ML IN [facility] BOTTLE     Special Requests NONE     Setup Time [id number]     Culture       Value:        BLOOD CULTURE RECEIVED NO GROWTH TO DATE CULTURE WILL BE HELD FOR 5 DAYS BEFORE ISSUING A FINAL NEGATIVE REPORT   Report Status PENDING    CULTURE, BLOOD (ROUTINE X 2)      Component Value Range   Specimen Description BLOOD RIGHT ARM  10 ML IN [facility] BOTTLE  Special Requests NONE     Setup Time [id number]     Culture       Value:        BLOOD CULTURE RECEIVED NO GROWTH TO DATE  CULTURE WILL BE HELD FOR 5 DAYS BEFORE ISSUING A FINAL NEGATIVE REPORT   Report Status PENDING    GLUCOSE, CAPILLARY      Component Value Range   Glucose-Capillary 200 (*) 70 - 99 (mg/dL)  PROTIME-INR      Component Value Range   Prothrombin Time 18.6 (*) 11.6 - 15.2 (seconds)   INR 1.52 (*) 0.00 - 1.49   CBC      Component Value Range   WBC 10.9 (*) 4.0 - 10.5 (K/uL)   RBC 2.66 (*) 4.22 - 5.81 (MIL/uL)   Hemoglobin 8.3 (*) 13.0 - 17.0 (g/dL)   HCT 21.3 (*) 08.6 - 52.0 (%)   MCV 88.7  78.0 - 100.0 (fL)   MCH 31.2  26.0 - 34.0 (pg)   MCHC 35.2  30.0 - 36.0 (g/dL)   RDW 57.8  46.9 - 62.9 (%)   Platelets 108 (*) 150 - 400 (K/uL)  RENAL FUNCTION PANEL      Component Value Range   Sodium 131 (*) 135 - 145 (mEq/L)   Potassium 3.6  3.5 - 5.1 (mEq/L)   Chloride 95 (*) 96 - 112 (mEq/L)   CO2 25  19 - 32 (mEq/L)   Glucose, Bld 103 (*) 70 - 99 (mg/dL)   BUN 36 (*) 6 - 23 (mg/dL)   Creatinine, Ser 5.28 (*) 0.50 - 1.35 (mg/dL)   Calcium 8.1 (*) 8.4 - 10.5 (mg/dL)   Phosphorus 2.7  2.3 - 4.6 (mg/dL)   Albumin 2.2 (*) 3.5 - 5.2 (g/dL)   GFR calc non Af Amer 26 (*) >90 (mL/min)   GFR calc Af Amer 30 (*) >90 (mL/min)  HEPATIC FUNCTION PANEL      Component Value Range   Total Protein 5.5 (*) 6.0 - 8.3 (g/dL)   Albumin 2.2 (*) 3.5 - 5.2 (g/dL)   AST 69 (*) 0 - 37 (U/L)   ALT <5  0 - 53 (U/L)   Alkaline Phosphatase 46  39 - 117 (U/L)   Total Bilirubin 0.5  0.3 - 1.2 (mg/dL)   Bilirubin, Direct 0.2  0.0 - 0.3 (mg/dL)   Indirect Bilirubin 0.3  0.3 - 0.9 (mg/dL)  HEMOGLOBIN AND HEMATOCRIT, BLOOD      Component Value Range   Hemoglobin 8.1 (*) 13.0 - 17.0 (g/dL)   HCT 41.3 (*) 24.4 - 52.0 (%)  GLUCOSE, CAPILLARY      Component Value Range   Glucose-Capillary 122 (*) 70 - 99 (mg/dL)   Comment 1 Documented in Chart     Comment 2 Notify RN    HEMOGLOBIN AND HEMATOCRIT, BLOOD      Component Value Range   Hemoglobin 9.4 (*) 13.0 - 17.0 (g/dL)   HCT 01.0 (*) 27.2 - 52.0 (%)  GLUCOSE,  CAPILLARY      Component Value Range   Glucose-Capillary 113 (*) 70 - 99 (mg/dL)  PREPARE RBC (CROSSMATCH)      Component Value Range   Order Confirmation ORDER PROCESSED BY BLOOD BANK    TYPE AND SCREEN      Component Value Range   ABO/RH(D) O NEG     Antibody Screen NEG     Sample Expiration 05/18/2011     Unit Number [id number]     Blood Component Type RBC  LR PHER1     Unit division 00     Status of Unit ISSUED,FINAL     Transfusion Status OK TO TRANSFUSE     Crossmatch Result Compatible     Unit Number [id number]     Blood Component Type RED CELLS,LR     Unit division 00     Status of Unit ISSUED,FINAL     Transfusion Status OK TO TRANSFUSE     Crossmatch Result Compatible    BLOOD GAS, ARTERIAL      Component Value Range   O2 Content 2.0     Delivery systems NASAL CANNULA     pH, Arterial 7.412  7.350 - 7.450    pCO2 arterial 40.0  35.0 - 45.0 (mmHg)   pO2, Arterial 65.6 (*) 80.0 - 100.0 (mmHg)   Bicarbonate 25.0 (*) 20.0 - 24.0 (mEq/L)   TCO2 22.8  0 - 100 (mmol/L)   Acid-Base Excess 0.9  0.0 - 2.0 (mmol/L)   O2 Saturation 93.4     Patient temperature 98.6     Collection site RIGHT RADIAL     Drawn by [id number]     Sample type ARTERIAL DRAW     Allens test (pass/fail) PASS  PASS   OCCULT BLOOD X 1 CARD TO LAB, STOOL      Component Value Range   Fecal Occult Bld NEGATIVE    GLUCOSE, CAPILLARY      Component Value Range   Glucose-Capillary 165 (*) 70 - 99 (mg/dL)  GLUCOSE, CAPILLARY      Component Value Range   Glucose-Capillary 146 (*) 70 - 99 (mg/dL)   Comment 1 Notify RN    PROTIME-INR      Component Value Range   Prothrombin Time 20.3 (*) 11.6 - 15.2 (seconds)   INR 1.70 (*) 0.00 - 1.49   VITAMIN B12      Component Value Range   Vitamin B-12 269  211 - 911 (pg/mL)  FOLATE      Component Value Range   Folate 14.8    IRON AND TIBC      Component Value Range   Iron 18 (*) 42 - 135 (ug/dL)   TIBC 914 (*) 782 - 956 (ug/dL)   Saturation Ratios 9 (*) 20 - 55  (%)   UIBC 186  125 - 400 (ug/dL)  FERRITIN      Component Value Range   Ferritin 361 (*) 22 - 322 (ng/mL)  RETICULOCYTES      Component Value Range   Retic Ct Pct 2.6  0.4 - 3.1 (%)   RBC. 3.16 (*) 4.22 - 5.81 (MIL/uL)   Retic Count, Manual 82.2  19.0 - 186.0 (K/uL)  CBC      Component Value Range   WBC 10.0  4.0 - 10.5 (K/uL)   RBC 3.16 (*) 4.22 - 5.81 (MIL/uL)   Hemoglobin 9.7 (*) 13.0 - 17.0 (g/dL)   HCT 21.3 (*) 08.6 - 52.0 (%)   MCV 87.3  78.0 - 100.0 (fL)   MCH 30.7  26.0 - 34.0 (pg)   MCHC 35.1  30.0 - 36.0 (g/dL)   RDW 57.8  46.9 - 62.9 (%)   Platelets 125 (*) 150 - 400 (K/uL)  RENAL FUNCTION PANEL      Component Value Range   Sodium 131 (*) 135 - 145 (mEq/L)   Potassium 3.8  3.5 - 5.1 (mEq/L)   Chloride 94 (*) 96 - 112 (mEq/L)   CO2 26  19 - 32 (  mEq/L)   Glucose, Bld 135 (*) 70 - 99 (mg/dL)   BUN 44 (*) 6 - 23 (mg/dL)   Creatinine, Ser 1.61 (*) 0.50 - 1.35 (mg/dL)   Calcium 8.3 (*) 8.4 - 10.5 (mg/dL)   Phosphorus 3.4  2.3 - 4.6 (mg/dL)   Albumin 2.2 (*) 3.5 - 5.2 (g/dL)   GFR calc non Af Amer 27 (*) >90 (mL/min)   GFR calc Af Amer 32 (*) >90 (mL/min)  HEMOGLOBIN AND HEMATOCRIT, BLOOD      Component Value Range   Hemoglobin 9.4 (*) 13.0 - 17.0 (g/dL)   HCT 09.6 (*) 04.5 - 52.0 (%)  GLUCOSE, CAPILLARY      Component Value Range   Glucose-Capillary 156 (*) 70 - 99 (mg/dL)   Comment 1 Documented in Chart     Comment 2 Notify RN    GLUCOSE, CAPILLARY      Component Value Range   Glucose-Capillary 145 (*) 70 - 99 (mg/dL)   Comment 1 Documented in Chart     Comment 2 Notify RN    GLUCOSE, CAPILLARY      Component Value Range   Glucose-Capillary 140 (*) 70 - 99 (mg/dL)   Comment 1 Documented in Chart     Comment 2 Notify RN    GLUCOSE, CAPILLARY      Component Value Range   Glucose-Capillary 153 (*) 70 - 99 (mg/dL)  GLUCOSE, CAPILLARY      Component Value Range   Glucose-Capillary 140 (*) 70 - 99 (mg/dL)  GLUCOSE, CAPILLARY      Component Value Range    Glucose-Capillary 240 (*) 70 - 99 (mg/dL)   Comment 1 Notify RN    PROTIME-INR      Component Value Range   Prothrombin Time 22.1 (*) 11.6 - 15.2 (seconds)   INR 1.90 (*) 0.00 - 1.49   CBC      Component Value Range   WBC 9.4  4.0 - 10.5 (K/uL)   RBC 3.19 (*) 4.22 - 5.81 (MIL/uL)   Hemoglobin 9.7 (*) 13.0 - 17.0 (g/dL)   HCT 40.9 (*) 81.1 - 52.0 (%)   MCV 88.4  78.0 - 100.0 (fL)   MCH 30.4  26.0 - 34.0 (pg)   MCHC 34.4  30.0 - 36.0 (g/dL)   RDW 91.4  78.2 - 95.6 (%)   Platelets 137 (*) 150 - 400 (K/uL)  BASIC METABOLIC PANEL      Component Value Range   Sodium 131 (*) 135 - 145 (mEq/L)   Potassium 3.7  3.5 - 5.1 (mEq/L)   Chloride 95 (*) 96 - 112 (mEq/L)   CO2 25  19 - 32 (mEq/L)   Glucose, Bld 166 (*) 70 - 99 (mg/dL)   BUN 48 (*) 6 - 23 (mg/dL)   Creatinine, Ser 2.13 (*) 0.50 - 1.35 (mg/dL)   Calcium 8.0 (*) 8.4 - 10.5 (mg/dL)   GFR calc non Af Amer 32 (*) >90 (mL/min)   GFR calc Af Amer 37 (*) >90 (mL/min)  GLUCOSE, CAPILLARY      Component Value Range   Glucose-Capillary 164 (*) 70 - 99 (mg/dL)   Comment 1 Notify RN    GLUCOSE, CAPILLARY      Component Value Range   Glucose-Capillary 158 (*) 70 - 99 (mg/dL)    Exam - Neurovascular intact Sensation intact distally Dressing/Incision - clean, but does have some yellow serous drainage from he previous Hemovac site.  No erythema, no signs of infection. Motor function intact -  moving foot and toes well on exam.   Assessment/Plan: 6 Days Post-Op Procedure(s) (LRB): TOTAL HIP REVISION (Right)  Past Medical History  Diagnosis Date  . Hypertension   . Stroke 05-03-11    TIA- more than 20 yrs ago.  . Asthma   . Shortness of breath 05-03-11    SOB with exertion only  . Diabetes mellitus 05-03-11    Oral meds used-Dabetes x5 yrs-CBG's (145)  . Neuromuscular disorder 05-03-11    Dx. Parkinson's -essential tremors-generally, ambulates with walker  . Arthritis 05-03-11    most joints- Bil. hips/knees replaced in the  past  . Skin cancer 05-03-11    Past hx. skin cancer lesions-removed  . DEMENTIA 05-03-11    hx. Alzheimers-tx. Aricept    Discharge to SNF possibly tomorrow if medically stable  DVT Prophylaxis - Coumadin Protocol TDWB only to right leg Plan for SNF  Aastha Dayley 05/17/2011, 9:02 AM

## 2011-05-17 NOTE — Progress Notes (Signed)
Subjective: Patient awake, has some occasional cough, complaining of hip pain. Denies shortness of breath or chest pain. Wife at bedside.  Objective: Filed Vitals:   05/16/11 2114 05/17/11 0600 05/17/11 1308 05/17/11 1430  BP: 132/64 128/62  109/60  Pulse: 76 78  71  Temp: 98.7 F (37.1 C) 98.6 F (37 C)  98.6 F (37 C)  TempSrc: Oral Oral  Oral  Resp: 19 18  18   Height:      Weight:      SpO2: 94% 94% 94% 94%   Weight change: 0 kg (0 lb)  Intake/Output Summary (Last 24 hours) at 05/17/11 1738 Last data filed at 05/17/11 1442  Gross per 24 hour  Intake    720 ml  Output   1505 ml  Net   -785 ml    General: Alert, awake, oriented x3, in no acute distress.  HEENT: No bruits, no goiter.  Heart: Regular rate and rhythm, without murmurs, rubs, gallops.  Lungs: , bilateral air movement, no wheezing or rhonchi.  Abdomen: Soft, nontender, nondistended, positive bowel sounds.  Neuro: Grossly intact, nonfocal. Extremities; +2 edema.   Lab Results:  Basename 05/17/11 0405 05/16/11 0441 05/15/11 0438  NA 131* 131* --  K 3.7 3.8 --  CL 95* 94* --  CO2 25 26 --  GLUCOSE 166* 135* --  BUN 48* 44* --  CREATININE 1.91* 2.19* --  CALCIUM 8.0* 8.3* --  MG -- -- --  PHOS -- 3.4 2.7    Basename 05/16/11 0441 05/15/11 0438  AST -- 69*  ALT -- <5  ALKPHOS -- 46  BILITOT -- 0.5  PROT -- 5.5*  ALBUMIN 2.2* 2.2*2.2*    Basename 05/17/11 0405 05/16/11 0740 05/16/11 0441  WBC 9.4 -- 10.0  NEUTROABS -- -- --  HGB 9.7* 9.4* --  HCT 28.2* 27.0* --  MCV 88.4 -- 87.3  PLT 137* -- 125*    Basename 05/16/11 0441  VITAMINB12 269  FOLATE 14.8  FERRITIN 361*  TIBC 204*  IRON 18*  RETICCTPCT 2.6    Micro Results: Recent Results (from the past 240 hour(s))  URINE CULTURE     Status: Normal   Collection Time   05/14/11 10:00 AM      Component Value Range Status Comment   Specimen Description URINE, CATHETERIZED   Final    Special Requests NONE   Final    Setup Time  [id number]   Final    Colony Count NO GROWTH   Final    Culture NO GROWTH   Final    Report Status 05/15/2011 FINAL   Final   CULTURE, BLOOD (ROUTINE X 2)     Status: Normal (Preliminary result)   Collection Time   05/14/11  1:56 PM      Component Value Range Status Comment   Specimen Description BLOOD RIGHT HAND  10 ML IN [facility] BOTTLE   Final    Special Requests NONE   Final    Setup Time [id number]   Final    Culture     Final    Value:        BLOOD CULTURE RECEIVED NO GROWTH TO DATE CULTURE WILL BE HELD FOR 5 DAYS BEFORE ISSUING A FINAL NEGATIVE REPORT   Report Status PENDING   Incomplete   CULTURE, BLOOD (ROUTINE X 2)     Status: Normal (Preliminary result)   Collection Time   05/14/11  3:11 PM      Component Value Range Status Comment  Specimen Description BLOOD RIGHT ARM  10 ML IN [facility] BOTTLE   Final    Special Requests NONE   Final    Setup Time [id number]   Final    Culture     Final    Value:        BLOOD CULTURE RECEIVED NO GROWTH TO DATE CULTURE WILL BE HELD FOR 5 DAYS BEFORE ISSUING A FINAL NEGATIVE REPORT   Report Status PENDING   Incomplete     Studies/Results: No results found.  Medications: I have reviewed the patient's current medications.  Encephalopathy: Resolved.   Delirium vs Secondary to Fever.  ABG no hypercapnia. Careful with sedative.  DIABETES MELLITUS (03/04/2008)  SSI as needed. Do not discharge patient on metformin. Can be discharged on Glimepiride.  Acute renal failure (05/14/2011): Secondary to ATN , Hemodynamically mediated. Restarted on Lasix. Appreciate renal help.  Hyponatremia (05/14/2011) Stable.   Thrombocytopenia (05/14/2011): Platelet increased to 137. Continue to monitor. Monitor for bleed.   Fever (05/14/2011): Chest xray 11-24 negative for PNA. Blood culture times 2 no growth. UA few bacteria. Zosyn added 11-24. I will treat for possible aspiration PNA. I will stop Zosyn (11-27). I will start Augmentin for total of 7 days of  antibiotics.    Anemia (05/14/2011): post surgery. Received 2 units 11-23. Two more units 11-25.   Lovenox stopped 11-25. HB stable in the 9 range. Fecal occult negative.  DVT prophylaxis: On coumadin.  Might be able to be discharge to SNF 11-28 if renal function stable.     LOS: 6 days   Corby Villasenor M.D.  [facility] 05/17/2011, 5:38 PM

## 2011-05-17 NOTE — Progress Notes (Signed)
S: Got tired from working with PT and somewhat dyspneic Wife says he's more SOB than usual - she refers to it as "his asthma" She's also worried about his continued issues with pain O: Medications: Infusions:   Scheduled Medications:    . antiseptic oral rinse  15 mL Mouth Rinse BID  . carbidopa-levodopa  1 tablet Oral TID  . docusate sodium  100 mg Oral BID  . donepezil  5 mg Oral QHS  . entacapone  200 mg Oral TID  . ferumoxytol  510 mg Intravenous Once  . fluticasone  1 spray Each Nare Daily  . insulin aspart  0-15 Units Subcutaneous TID WC  . loratadine  10 mg Oral Daily  . niacin  500 mg Oral BID WC  . piperacillin-tazobactam (ZOSYN)  IV  3.375 g Intravenous Q8H  . polyethylene glycol  17 g Oral BID  . simvastatin  40 mg Oral QHS  . vitamin A & D      . warfarin  7.5 mg Oral ONCE-1800  . warfarin  7.5 mg Oral ONCE-1800  . DISCONTD: insulin aspart  0-15 Units Subcutaneous Q4H    PRN Meds:.acetaminophen, acetaminophen, albuterol, bisacodyl, bisacodyl, ipratropium, ketoconazole, magnesium hydroxide, menthol-cetylpyridinium, olopatadine, ondansetron (ZOFRAN) IV, ondansetron, phenol, traMADol  BP 109/60  Pulse 71  Temp(Src) 98.6 F (37 C) (Oral)  Resp 18  Ht 5\' 6"  (1.676 m)  Wt 134.9 kg (297 lb 6.4 oz)  BMI 48.00 kg/m2  SpO2 94%   Intake/Output Summary (Last 24 hours) at 05/17/11 1501 Last data filed at 05/17/11 1442  Gross per 24 hour  Intake    770 ml  Output   1505 ml  Net   -735 ml    Weight change: 0 kg (0 lb)  EXAM: Gen:MO WM.  Wearing O2 CVS:S1S2 No S3 Resp:Breath sounds very diminished with scattered expiratory wheeze WUJ:WJXBJ No focal tenderness Ext:2+ edema bilaterally  Wearing SCD's Foley in place, dark clear urine   Labs: Basic Metabolic Panel:  Lab 05/17/11 4782 05/16/11 0441 05/15/11 0438  NA 131* 131* 131*  K 3.7 3.8 3.6  CL 95* 94* 95*  CO2 25 26 25   GLUCOSE 166* 135* 103*  BUN 48* 44* 36*  CREATININE 1.91* 2.19* 2.31*  CALCIUM  8.0* 8.3* 8.1*  MG -- -- --  PHOS -- 3.4 2.7    Liver Function Tests:  Lab 05/16/11 0441 05/15/11 0438  AST -- 69*  ALT -- <5  ALKPHOS -- 46  BILITOT -- 0.5  PROT -- 5.5*  ALBUMIN 2.2* 2.2*2.2*   No results found for this basename: LIPASE:3,AMYLASE:3 in the last 168 hours No results found for this basename: AMMONIA:3 in the last 168 hours  CBC:  Lab 05/17/11 0405 05/16/11 0740 05/16/11 0441 05/15/11 0438 05/14/11 0402 05/13/11 0355  WBC 9.4 -- 10.0 10.9* -- --  NEUTROABS -- -- -- -- -- --  HGB 9.7* 9.4* 9.7* -- -- --  HCT 28.2* 27.0* 27.6* -- -- --  MCV 88.4 -- 87.3 88.7 88.1 90.7  PLT 137* -- 125* 108* -- --    Cardiac Enzymes: No results found for this basename: CKTOTAL:5,CKMB:5,CKMBINDEX:5,TROPONINI:5 in the last 168 hours  CBG:  Lab 05/17/11 1217 05/17/11 0856 05/16/11 2111 05/16/11 1617 05/16/11 1131  GLUCAP 164* 158* 164* 240* 140*    Iron Studies:  Basename 05/16/11 0441  IRON 18*  TIBC 204*  TRANSFERRIN --  FERRITIN 361*    ABG    Component Value Date/Time   PHART 7.412  05/15/2011 1044   PCO2ART 40.0 05/15/2011 1044   PO2ART 65.6* 05/15/2011 1044   HCO3 25.0* 05/15/2011 1044   TCO2 22.8 05/15/2011 1044   O2SAT 93.4 05/15/2011 1044    Assessment/Plan:77yo wm who is s/p elective hip revision on 05/11/11 with post operative acute renal failure in the setting of fever, acute blood loss anemia, ARB;  non-oliguric with improving renal function past 2 days  1. AKI - creatinine continues to fall and  to fall and is close to pre-op baseline of 1.8. Was most c/w hemodyamic insult from ABLA compounded by ARB in that setting as opposed to ATN. Would keep off ARB's at this time. Non oliguric without diuretics, but IS volume overloaded by exam (wife ways he was chronically on 40 mg/day of lasix PTA) so will give an IV dose of Lasix now and then resume his usual 40 mg/day. 2. Anemia - blood loss plus iron deficiency. Feraheme 510 mg was given IV for Fe  deficiency.  Hb stable mid 9's  3. DM per primary  4. Hyponatremia stable  5. Fever - cultures negative to date; on empiric antibiotics (Zosyn) 6. Hip revision - per ortho  7. Dyspnea - I suspect volume component rather than "asthma" that wife refers to.  Lasix as above.  Nothing else to add from a renal standpoint other than the addition of his home diuretic after an IV dose today as above.  Would keep off ARB's.  Volume/lytes can be managed by primary medical service for remainder of hospitalization.    Deetra Booton B

## 2011-05-17 NOTE — Progress Notes (Signed)
ANTICOAGULATION CONSULT NOTE - Follow Up Consult  Pharmacy Consult for Coumadin Indication: VTE prophylaxis s/p right total hip revision   No Known Allergies  Patient Measurements: Height: 5\' 6"  (167.6 cm) Weight: 297 lb 6.4 oz (134.9 kg) IBW/kg (Calculated) : 63.8   Vital Signs: Temp: 98.6 F (37 C) (11/27 0600) Temp src: Oral (11/27 0600) BP: 128/62 mmHg (11/27 0600) Pulse Rate: 78  (11/27 0600)  Labs:  Basename 05/17/11 0405 05/16/11 0740 05/16/11 0441 05/15/11 0438  HGB 9.7* 9.4* -- --  HCT 28.2* 27.0* 27.6* --  PLT 137* -- 125* 108*  APTT -- -- -- --  LABPROT 22.1* -- 20.3* 18.6*  INR 1.90* -- 1.70* 1.52*  HEPARINUNFRC -- -- -- --  CREATININE 1.91* -- 2.19* 2.31*  CKTOTAL -- -- -- --  CKMB -- -- -- --  TROPONINI -- -- -- --   Estimated Creatinine Clearance: 42.2 ml/min (by C-G formula based on Cr of 1.91).   Medications:  Scheduled:     . antiseptic oral rinse  15 mL Mouth Rinse BID  . carbidopa-levodopa  1 tablet Oral TID  . docusate sodium  100 mg Oral BID  . donepezil  5 mg Oral QHS  . entacapone  200 mg Oral TID  . ferumoxytol  510 mg Intravenous Once  . fluticasone  1 spray Each Nare Daily  . insulin aspart  0-15 Units Subcutaneous TID WC  . loratadine  10 mg Oral Daily  . niacin  500 mg Oral BID WC  . piperacillin-tazobactam (ZOSYN)  IV  3.375 g Intravenous Q8H  . polyethylene glycol  17 g Oral BID  . simvastatin  40 mg Oral QHS  . vitamin A & D      . warfarin  7.5 mg Oral ONCE-1800  . DISCONTD: insulin aspart  0-15 Units Subcutaneous Q4H  Anticoagulants: Coumadin 7.5mg  on 11/21- 11/26    Assessment: 77 yo male s/p R total hip revision started on coumadin 11/21. INR still subtherapeutic at 1.9 today, but increasing slowly and steadily  Goal of Therapy:  INR 2-3   Plan:  Continue Coumadin 7.5 mg PO x1 today Lovenox discontinued 11/25 Follow up daily PT/INR, SCr Coumadin education, will wait for spouse to teach. Plan SNF at discharge  for rehab  Preston Carter L 12:10 PM

## 2011-05-18 ENCOUNTER — Inpatient Hospital Stay (HOSPITAL_COMMUNITY): Payer: Medicare Other

## 2011-05-18 DIAGNOSIS — J449 Chronic obstructive pulmonary disease, unspecified: Secondary | ICD-10-CM | POA: Diagnosis present

## 2011-05-18 LAB — CBC
HCT: 28.6 % — ABNORMAL LOW (ref 39.0–52.0)
MCV: 88.5 fL (ref 78.0–100.0)
RBC: 3.23 MIL/uL — ABNORMAL LOW (ref 4.22–5.81)
WBC: 9.4 10*3/uL (ref 4.0–10.5)

## 2011-05-18 LAB — RENAL FUNCTION PANEL
BUN: 44 mg/dL — ABNORMAL HIGH (ref 6–23)
Chloride: 95 mEq/L — ABNORMAL LOW (ref 96–112)
Glucose, Bld: 184 mg/dL — ABNORMAL HIGH (ref 70–99)
Potassium: 3 mEq/L — ABNORMAL LOW (ref 3.5–5.1)

## 2011-05-18 LAB — GLUCOSE, CAPILLARY
Glucose-Capillary: 172 mg/dL — ABNORMAL HIGH (ref 70–99)
Glucose-Capillary: 218 mg/dL — ABNORMAL HIGH (ref 70–99)
Glucose-Capillary: 219 mg/dL — ABNORMAL HIGH (ref 70–99)

## 2011-05-18 LAB — PROTIME-INR
INR: 2.34 — ABNORMAL HIGH (ref 0.00–1.49)
Prothrombin Time: 26 seconds — ABNORMAL HIGH (ref 11.6–15.2)

## 2011-05-18 MED ORDER — AMOXICILLIN-POT CLAVULANATE 875-125 MG PO TABS: 1.0000 | ORAL_TABLET | Freq: Two times a day (BID) | ORAL | Status: AC

## 2011-05-18 MED ORDER — IPRATROPIUM-ALBUTEROL 18-103 MCG/ACT IN AERO
2.0000 | INHALATION_SPRAY | Freq: Four times a day (QID) | RESPIRATORY_TRACT | Status: DC
  Administered 2011-05-19: 2 via RESPIRATORY_TRACT
  Filled 2011-05-18: qty 14.7

## 2011-05-18 MED ORDER — ACETAMINOPHEN 325 MG PO TABS: 650.0000 mg | ORAL_TABLET | Freq: Four times a day (QID) | ORAL | Status: AC | PRN

## 2011-05-18 MED ORDER — ONDANSETRON HCL 4 MG PO TABS: 4.0000 mg | ORAL_TABLET | Freq: Four times a day (QID) | ORAL | Status: AC | PRN

## 2011-05-18 MED ORDER — POTASSIUM CHLORIDE CRYS ER 20 MEQ PO TBCR
20.0000 meq | EXTENDED_RELEASE_TABLET | Freq: Every day | ORAL | Status: DC
  Administered 2011-05-18 – 2011-05-19 (×2): 20 meq via ORAL
  Filled 2011-05-18 (×2): qty 1

## 2011-05-18 MED ORDER — POLYETHYLENE GLYCOL 3350 17 G PO PACK: 17.0000 g | PACK | Freq: Two times a day (BID) | ORAL | Status: AC

## 2011-05-18 MED ORDER — WARFARIN SODIUM 7.5 MG PO TABS
7.5000 mg | ORAL_TABLET | Freq: Once | ORAL | Status: AC
  Administered 2011-05-18: 7.5 mg via ORAL
  Filled 2011-05-18: qty 1

## 2011-05-18 MED ORDER — MAGNESIUM HYDROXIDE 400 MG/5ML PO SUSP: 30.0000 mL | Freq: Two times a day (BID) | ORAL | Status: AC | PRN

## 2011-05-18 NOTE — Progress Notes (Signed)
ANTICOAGULATION CONSULT NOTE - Follow Up Consult  Pharmacy Consult for Coumadin Indication: VTE prophylaxis s/p right total hip revision   No Known Allergies  Patient Measurements: Height: 5\' 6"  (167.6 cm) Weight: 297 lb 6.4 oz (134.9 kg) IBW/kg (Calculated) : 63.8   Vital Signs: Temp: 97.9 F (36.6 C) (11/28 1453) Temp src: Oral (11/28 1453) BP: 129/62 mmHg (11/28 1453) Pulse Rate: 65  (11/28 1453)  Labs:  Basename 05/18/11 0450 05/17/11 0405 05/16/11 0740 05/16/11 0441  HGB 9.7* 9.7* -- --  HCT 28.6* 28.2* 27.0* --  PLT 150 137* -- 125*  APTT -- -- -- --  LABPROT 26.0* 22.1* -- 20.3*  INR 2.34* 1.90* -- 1.70*  HEPARINUNFRC -- -- -- --  CREATININE 1.74* 1.91* -- 2.19*  CKTOTAL -- -- -- --  CKMB -- -- -- --  TROPONINI -- -- -- --   Estimated Creatinine Clearance: 46.4 ml/min (by C-G formula based on Cr of 1.74).   Medications:  Scheduled:     . albuterol-ipratropium  2 puff Inhalation Q6H  . amoxicillin-clavulanate  1 tablet Oral Q12H  . antiseptic oral rinse  15 mL Mouth Rinse BID  . carbidopa-levodopa  1 tablet Oral TID  . docusate sodium  100 mg Oral BID  . donepezil  5 mg Oral QHS  . entacapone  200 mg Oral TID  . fluticasone  1 spray Each Nare Daily  . furosemide  80 mg Intravenous Once  . furosemide  40 mg Oral Daily  . insulin aspart  0-15 Units Subcutaneous TID WC  . loratadine  10 mg Oral Daily  . niacin  500 mg Oral BID WC  . polyethylene glycol  17 g Oral BID  . potassium chloride  20 mEq Oral Daily  . simvastatin  40 mg Oral QHS  . warfarin  7.5 mg Oral ONCE-1800  . DISCONTD: piperacillin-tazobactam (ZOSYN)  IV  3.375 g Intravenous Q8H  Anticoagulants: Coumadin 7.5mg  on 11/21- 11/27    Assessment: 77 yo male s/p R total hip revision started on coumadin 11/21. INR therapeutic today  Goal of Therapy:  INR 2-3   Plan:  Continue Coumadin 7.5 mg PO x1 today Follow up daily PT/INR, SCr Coumadin education, will wait for spouse to  teach. Plan SNF at discharge for rehab  Lynann Beaver PharmD  Pager [phone] 05/18/2011 3:41 PM

## 2011-05-18 NOTE — Discharge Summary (Signed)
Physician Discharge Summary   Patient ID: Preston Carter MRN: [id number] DOB/AGE: 1931-06-23 77 y.o.  Admit date: 05/11/2011 Discharge date: 05/18/2011  Primary Diagnosis: Failed right THA  Admission Diagnoses: Past Medical History  Diagnosis Date  . Hypertension   . Stroke 05-03-11    TIA- more than 20 yrs ago.  . Asthma   . Shortness of breath 05-03-11    SOB with exertion only  . Diabetes mellitus 05-03-11    Oral meds used-Dabetes x5 yrs-CBG's (145)  . Neuromuscular disorder 05-03-11    Dx. Parkinson's -essential tremors-generally, ambulates with walker  . Arthritis 05-03-11    most joints- Bil. hips/knees replaced in the past  . Skin cancer 05-03-11    Past hx. skin cancer lesions-removed  . DEMENTIA 05-03-11    hx. Alzheimers-tx. Aricept    Discharge Diagnoses:  Active Problems:  DIABETES MELLITUS  Acute renal failure  Hyponatremia  Thrombocytopenia  Fever  Anemia Acute Blood Loss Anemia S/P Transfusion Postop Hypokalemia AMS/Postop Delirium,Resolving  Procedure: Procedure(s) (LRB): TOTAL HIP REVISION (Right)   Consults: nephrology and [facility]  HPI:  Preston Carter is a 77 year old male who had a  right total hip arthroplasty many years ago. He has a grossly loose  femoral stem, which is about protruding through the lateral cortex of  the femur. He has severe pain with this. His acetabular component is  intact, but appears to have some eccentric polyethylene wear without  osteolysis. He presents now for a total hip arthroplasty revision.      Laboratory Data: Results for orders placed during the hospital encounter of 05/11/11  TYPE AND SCREEN      Component Value Range   ABO/RH(D) O NEG     Antibody Screen NEG     Sample Expiration 05/14/2011     Unit Number [id number]     Blood Component Type RBC LR PHER1     Unit division 00     Status of Unit ISSUED,FINAL     Transfusion Status OK TO TRANSFUSE     Crossmatch Result Compatible       Unit Number [id number]     Blood Component Type RBC LR PHER1     Unit division 00     Status of Unit ISSUED,FINAL     Transfusion Status OK TO TRANSFUSE     Crossmatch Result Compatible    GLUCOSE, CAPILLARY      Component Value Range   Glucose-Capillary 120 (*) 70 - 99 (mg/dL)  ABO/RH      Component Value Range   ABO/RH(D) O NEG    GLUCOSE, CAPILLARY      Component Value Range   Glucose-Capillary 156 (*) 70 - 99 (mg/dL)   Comment 1 Documented in Chart     Comment 2 Notify RN    PROTIME-INR      Component Value Range   Prothrombin Time 14.6  11.6 - 15.2 (seconds)   INR 1.12  0.00 - 1.49   CBC      Component Value Range   WBC 8.0  4.0 - 10.5 (K/uL)   RBC 2.94 (*) 4.22 - 5.81 (MIL/uL)   Hemoglobin 9.2 (*) 13.0 - 17.0 (g/dL)   HCT 01.0 (*) 27.2 - 52.0 (%)   MCV 90.5  78.0 - 100.0 (fL)   MCH 31.3  26.0 - 34.0 (pg)   MCHC 34.6  30.0 - 36.0 (g/dL)   RDW 53.6  64.4 - 03.4 (%)   Platelets 134 (*) 150 - 400 (  K/uL)  BASIC METABOLIC PANEL      Component Value Range   Sodium 133 (*) 135 - 145 (mEq/L)   Potassium 4.0  3.5 - 5.1 (mEq/L)   Chloride 98  96 - 112 (mEq/L)   CO2 26  19 - 32 (mEq/L)   Glucose, Bld 178 (*) 70 - 99 (mg/dL)   BUN 25 (*) 6 - 23 (mg/dL)   Creatinine, Ser 1.61  0.50 - 1.35 (mg/dL)   Calcium 8.2 (*) 8.4 - 10.5 (mg/dL)   GFR calc non Af Amer 52 (*) >90 (mL/min)   GFR calc Af Amer 61 (*) >90 (mL/min)  GLUCOSE, CAPILLARY      Component Value Range   Glucose-Capillary 170 (*) 70 - 99 (mg/dL)  GLUCOSE, CAPILLARY      Component Value Range   Glucose-Capillary 178 (*) 70 - 99 (mg/dL)  GLUCOSE, CAPILLARY      Component Value Range   Glucose-Capillary 172 (*) 70 - 99 (mg/dL)   Comment 1 Notify RN    PROTIME-INR      Component Value Range   Prothrombin Time 16.3 (*) 11.6 - 15.2 (seconds)   INR 1.29  0.00 - 1.49   CBC      Component Value Range   WBC 8.8  4.0 - 10.5 (K/uL)   RBC 2.59 (*) 4.22 - 5.81 (MIL/uL)   Hemoglobin 8.1 (*) 13.0 - 17.0 (g/dL)   HCT  09.6 (*) 04.5 - 52.0 (%)   MCV 90.7  78.0 - 100.0 (fL)   MCH 31.3  26.0 - 34.0 (pg)   MCHC 34.5  30.0 - 36.0 (g/dL)   RDW 40.9  81.1 - 91.4 (%)   Platelets 107 (*) 150 - 400 (K/uL)  BASIC METABOLIC PANEL      Component Value Range   Sodium 132 (*) 135 - 145 (mEq/L)   Potassium 3.3 (*) 3.5 - 5.1 (mEq/L)   Chloride 97  96 - 112 (mEq/L)   CO2 28  19 - 32 (mEq/L)   Glucose, Bld 150 (*) 70 - 99 (mg/dL)   BUN 25 (*) 6 - 23 (mg/dL)   Creatinine, Ser 7.82 (*) 0.50 - 1.35 (mg/dL)   Calcium 8.2 (*) 8.4 - 10.5 (mg/dL)   GFR calc non Af Amer 35 (*) >90 (mL/min)   GFR calc Af Amer 40 (*) >90 (mL/min)  GLUCOSE, CAPILLARY      Component Value Range   Glucose-Capillary 162 (*) 70 - 99 (mg/dL)  GLUCOSE, CAPILLARY      Component Value Range   Glucose-Capillary 196 (*) 70 - 99 (mg/dL)  GLUCOSE, CAPILLARY      Component Value Range   Glucose-Capillary 176 (*) 70 - 99 (mg/dL)  PREPARE RBC (CROSSMATCH)      Component Value Range   Order Confirmation ORDER PROCESSED BY BLOOD BANK    GLUCOSE, CAPILLARY      Component Value Range   Glucose-Capillary 201 (*) 70 - 99 (mg/dL)  GLUCOSE, CAPILLARY      Component Value Range   Glucose-Capillary 122 (*) 70 - 99 (mg/dL)  PROTIME-INR      Component Value Range   Prothrombin Time 16.6 (*) 11.6 - 15.2 (seconds)   INR 1.32  0.00 - 1.49   CBC      Component Value Range   WBC 10.0  4.0 - 10.5 (K/uL)   RBC 2.86 (*) 4.22 - 5.81 (MIL/uL)   Hemoglobin 8.8 (*) 13.0 - 17.0 (g/dL)   HCT 95.6 (*) 21.3 -  52.0 (%)   MCV 88.1  78.0 - 100.0 (fL)   MCH 30.8  26.0 - 34.0 (pg)   MCHC 34.9  30.0 - 36.0 (g/dL)   RDW 16.1  09.6 - 04.5 (%)   Platelets 87 (*) 150 - 400 (K/uL)  BASIC METABOLIC PANEL      Component Value Range   Sodium 129 (*) 135 - 145 (mEq/L)   Potassium 3.5  3.5 - 5.1 (mEq/L)   Chloride 95 (*) 96 - 112 (mEq/L)   CO2 26  19 - 32 (mEq/L)   Glucose, Bld 187 (*) 70 - 99 (mg/dL)   BUN 28 (*) 6 - 23 (mg/dL)   Creatinine, Ser 4.09 (*) 0.50 - 1.35 (mg/dL)     Calcium 8.0 (*) 8.4 - 10.5 (mg/dL)   GFR calc non Af Amer 32 (*) >90 (mL/min)   GFR calc Af Amer 37 (*) >90 (mL/min)  GLUCOSE, CAPILLARY      Component Value Range   Glucose-Capillary 173 (*) 70 - 99 (mg/dL)  GLUCOSE, CAPILLARY      Component Value Range   Glucose-Capillary 174 (*) 70 - 99 (mg/dL)  PRO B NATRIURETIC PEPTIDE      Component Value Range   BNP, POC 1672.0 (*) 0 - 450 (pg/mL)  URINALYSIS, ROUTINE W REFLEX MICROSCOPIC      Component Value Range   Color, Urine AMBER (*) YELLOW    Appearance CLOUDY (*) CLEAR    Specific Gravity, Urine 1.020  1.005 - 1.030    pH 5.5  5.0 - 8.0    Glucose, UA NEGATIVE  NEGATIVE (mg/dL)   Hgb urine dipstick LARGE (*) NEGATIVE    Bilirubin Urine NEGATIVE  NEGATIVE    Ketones, ur NEGATIVE  NEGATIVE (mg/dL)   Protein, ur 811 (*) NEGATIVE (mg/dL)   Urobilinogen, UA 0.2  0.0 - 1.0 (mg/dL)   Nitrite NEGATIVE  NEGATIVE    Leukocytes, UA SMALL (*) NEGATIVE   CREATININE, URINE, RANDOM      Component Value Range   Creatinine, Urine 163.6    NA AND K (SODIUM & POTASSIUM), RAND UR      Component Value Range   Sodium, Ur 50     Potassium Urine Timed 26    URINE CULTURE      Component Value Range   Specimen Description URINE, CATHETERIZED     Special Requests NONE     Setup Time [id number]     Colony Count NO GROWTH     Culture NO GROWTH     Report Status 05/15/2011 FINAL    HEMOGLOBIN AND HEMATOCRIT, BLOOD      Component Value Range   Hemoglobin 9.1 (*) 13.0 - 17.0 (g/dL)   HCT 08.6 (*) 57.8 - 52.0 (%)  OSMOLALITY, URINE      Component Value Range   Osmolality, Ur 471  390 - 1090 (mOsm/kg)  URINE MICROSCOPIC-ADD ON      Component Value Range   Squamous Epithelial / LPF RARE  RARE    WBC, UA 3-6  <3 (WBC/hpf)   RBC / HPF 3-6  <3 (RBC/hpf)   Bacteria, UA MANY (*) RARE   GLUCOSE, CAPILLARY      Component Value Range   Glucose-Capillary 147 (*) 70 - 99 (mg/dL)  CULTURE, BLOOD (ROUTINE X 2)      Component Value Range   Specimen  Description BLOOD RIGHT HAND  10 ML IN [facility] BOTTLE     Special Requests NONE     Setup  Time [id number]     Culture       Value:        BLOOD CULTURE RECEIVED NO GROWTH TO DATE CULTURE WILL BE HELD FOR 5 DAYS BEFORE ISSUING A FINAL NEGATIVE REPORT   Report Status PENDING    CULTURE, BLOOD (ROUTINE X 2)      Component Value Range   Specimen Description BLOOD RIGHT ARM  10 ML IN [facility] BOTTLE     Special Requests NONE     Setup Time [id number]     Culture       Value:        BLOOD CULTURE RECEIVED NO GROWTH TO DATE CULTURE WILL BE HELD FOR 5 DAYS BEFORE ISSUING A FINAL NEGATIVE REPORT   Report Status PENDING    GLUCOSE, CAPILLARY      Component Value Range   Glucose-Capillary 200 (*) 70 - 99 (mg/dL)  PROTIME-INR      Component Value Range   Prothrombin Time 18.6 (*) 11.6 - 15.2 (seconds)   INR 1.52 (*) 0.00 - 1.49   CBC      Component Value Range   WBC 10.9 (*) 4.0 - 10.5 (K/uL)   RBC 2.66 (*) 4.22 - 5.81 (MIL/uL)   Hemoglobin 8.3 (*) 13.0 - 17.0 (g/dL)   HCT 29.5 (*) 28.4 - 52.0 (%)   MCV 88.7  78.0 - 100.0 (fL)   MCH 31.2  26.0 - 34.0 (pg)   MCHC 35.2  30.0 - 36.0 (g/dL)   RDW 13.2  44.0 - 10.2 (%)   Platelets 108 (*) 150 - 400 (K/uL)  RENAL FUNCTION PANEL      Component Value Range   Sodium 131 (*) 135 - 145 (mEq/L)   Potassium 3.6  3.5 - 5.1 (mEq/L)   Chloride 95 (*) 96 - 112 (mEq/L)   CO2 25  19 - 32 (mEq/L)   Glucose, Bld 103 (*) 70 - 99 (mg/dL)   BUN 36 (*) 6 - 23 (mg/dL)   Creatinine, Ser 7.25 (*) 0.50 - 1.35 (mg/dL)   Calcium 8.1 (*) 8.4 - 10.5 (mg/dL)   Phosphorus 2.7  2.3 - 4.6 (mg/dL)   Albumin 2.2 (*) 3.5 - 5.2 (g/dL)   GFR calc non Af Amer 26 (*) >90 (mL/min)   GFR calc Af Amer 30 (*) >90 (mL/min)  HEPATIC FUNCTION PANEL      Component Value Range   Total Protein 5.5 (*) 6.0 - 8.3 (g/dL)   Albumin 2.2 (*) 3.5 - 5.2 (g/dL)   AST 69 (*) 0 - 37 (U/L)   ALT <5  0 - 53 (U/L)   Alkaline Phosphatase 46  39 - 117 (U/L)   Total Bilirubin 0.5  0.3 - 1.2 (mg/dL)    Bilirubin, Direct 0.2  0.0 - 0.3 (mg/dL)   Indirect Bilirubin 0.3  0.3 - 0.9 (mg/dL)  HEMOGLOBIN AND HEMATOCRIT, BLOOD      Component Value Range   Hemoglobin 8.1 (*) 13.0 - 17.0 (g/dL)   HCT 36.6 (*) 44.0 - 52.0 (%)  GLUCOSE, CAPILLARY      Component Value Range   Glucose-Capillary 122 (*) 70 - 99 (mg/dL)   Comment 1 Documented in Chart     Comment 2 Notify RN    HEMOGLOBIN AND HEMATOCRIT, BLOOD      Component Value Range   Hemoglobin 9.4 (*) 13.0 - 17.0 (g/dL)   HCT 34.7 (*) 42.5 - 52.0 (%)  GLUCOSE, CAPILLARY      Component Value Range  Glucose-Capillary 113 (*) 70 - 99 (mg/dL)  PREPARE RBC (CROSSMATCH)      Component Value Range   Order Confirmation ORDER PROCESSED BY BLOOD BANK    TYPE AND SCREEN      Component Value Range   ABO/RH(D) O NEG     Antibody Screen NEG     Sample Expiration 05/18/2011     Unit Number [id number]     Blood Component Type RBC LR PHER1     Unit division 00     Status of Unit ISSUED,FINAL     Transfusion Status OK TO TRANSFUSE     Crossmatch Result Compatible     Unit Number [id number]     Blood Component Type RED CELLS,LR     Unit division 00     Status of Unit ISSUED,FINAL     Transfusion Status OK TO TRANSFUSE     Crossmatch Result Compatible    BLOOD GAS, ARTERIAL      Component Value Range   O2 Content 2.0     Delivery systems NASAL CANNULA     pH, Arterial 7.412  7.350 - 7.450    pCO2 arterial 40.0  35.0 - 45.0 (mmHg)   pO2, Arterial 65.6 (*) 80.0 - 100.0 (mmHg)   Bicarbonate 25.0 (*) 20.0 - 24.0 (mEq/L)   TCO2 22.8  0 - 100 (mmol/L)   Acid-Base Excess 0.9  0.0 - 2.0 (mmol/L)   O2 Saturation 93.4     Patient temperature 98.6     Collection site RIGHT RADIAL     Drawn by [id number]     Sample type ARTERIAL DRAW     Allens test (pass/fail) PASS  PASS   OCCULT BLOOD X 1 CARD TO LAB, STOOL      Component Value Range   Fecal Occult Bld NEGATIVE    GLUCOSE, CAPILLARY      Component Value Range   Glucose-Capillary 165 (*) 70 - 99  (mg/dL)  GLUCOSE, CAPILLARY      Component Value Range   Glucose-Capillary 146 (*) 70 - 99 (mg/dL)   Comment 1 Notify RN    PROTIME-INR      Component Value Range   Prothrombin Time 20.3 (*) 11.6 - 15.2 (seconds)   INR 1.70 (*) 0.00 - 1.49   VITAMIN B12      Component Value Range   Vitamin B-12 269  211 - 911 (pg/mL)  FOLATE      Component Value Range   Folate 14.8    IRON AND TIBC      Component Value Range   Iron 18 (*) 42 - 135 (ug/dL)   TIBC 469 (*) 629 - 528 (ug/dL)   Saturation Ratios 9 (*) 20 - 55 (%)   UIBC 186  125 - 400 (ug/dL)  FERRITIN      Component Value Range   Ferritin 361 (*) 22 - 322 (ng/mL)  RETICULOCYTES      Component Value Range   Retic Ct Pct 2.6  0.4 - 3.1 (%)   RBC. 3.16 (*) 4.22 - 5.81 (MIL/uL)   Retic Count, Manual 82.2  19.0 - 186.0 (K/uL)  CBC      Component Value Range   WBC 10.0  4.0 - 10.5 (K/uL)   RBC 3.16 (*) 4.22 - 5.81 (MIL/uL)   Hemoglobin 9.7 (*) 13.0 - 17.0 (g/dL)   HCT 41.3 (*) 24.4 - 52.0 (%)   MCV 87.3  78.0 - 100.0 (fL)   MCH 30.7  26.0 -  34.0 (pg)   MCHC 35.1  30.0 - 36.0 (g/dL)   RDW 56.2  13.0 - 86.5 (%)   Platelets 125 (*) 150 - 400 (K/uL)  RENAL FUNCTION PANEL      Component Value Range   Sodium 131 (*) 135 - 145 (mEq/L)   Potassium 3.8  3.5 - 5.1 (mEq/L)   Chloride 94 (*) 96 - 112 (mEq/L)   CO2 26  19 - 32 (mEq/L)   Glucose, Bld 135 (*) 70 - 99 (mg/dL)   BUN 44 (*) 6 - 23 (mg/dL)   Creatinine, Ser 7.84 (*) 0.50 - 1.35 (mg/dL)   Calcium 8.3 (*) 8.4 - 10.5 (mg/dL)   Phosphorus 3.4  2.3 - 4.6 (mg/dL)   Albumin 2.2 (*) 3.5 - 5.2 (g/dL)   GFR calc non Af Amer 27 (*) >90 (mL/min)   GFR calc Af Amer 32 (*) >90 (mL/min)  HEMOGLOBIN AND HEMATOCRIT, BLOOD      Component Value Range   Hemoglobin 9.4 (*) 13.0 - 17.0 (g/dL)   HCT 69.6 (*) 29.5 - 52.0 (%)  GLUCOSE, CAPILLARY      Component Value Range   Glucose-Capillary 156 (*) 70 - 99 (mg/dL)   Comment 1 Documented in Chart     Comment 2 Notify RN    GLUCOSE,  CAPILLARY      Component Value Range   Glucose-Capillary 145 (*) 70 - 99 (mg/dL)   Comment 1 Documented in Chart     Comment 2 Notify RN    GLUCOSE, CAPILLARY      Component Value Range   Glucose-Capillary 140 (*) 70 - 99 (mg/dL)   Comment 1 Documented in Chart     Comment 2 Notify RN    GLUCOSE, CAPILLARY      Component Value Range   Glucose-Capillary 153 (*) 70 - 99 (mg/dL)  GLUCOSE, CAPILLARY      Component Value Range   Glucose-Capillary 140 (*) 70 - 99 (mg/dL)  GLUCOSE, CAPILLARY      Component Value Range   Glucose-Capillary 240 (*) 70 - 99 (mg/dL)   Comment 1 Notify RN    PROTIME-INR      Component Value Range   Prothrombin Time 22.1 (*) 11.6 - 15.2 (seconds)   INR 1.90 (*) 0.00 - 1.49   CBC      Component Value Range   WBC 9.4  4.0 - 10.5 (K/uL)   RBC 3.19 (*) 4.22 - 5.81 (MIL/uL)   Hemoglobin 9.7 (*) 13.0 - 17.0 (g/dL)   HCT 28.4 (*) 13.2 - 52.0 (%)   MCV 88.4  78.0 - 100.0 (fL)   MCH 30.4  26.0 - 34.0 (pg)   MCHC 34.4  30.0 - 36.0 (g/dL)   RDW 44.0  10.2 - 72.5 (%)   Platelets 137 (*) 150 - 400 (K/uL)  BASIC METABOLIC PANEL      Component Value Range   Sodium 131 (*) 135 - 145 (mEq/L)   Potassium 3.7  3.5 - 5.1 (mEq/L)   Chloride 95 (*) 96 - 112 (mEq/L)   CO2 25  19 - 32 (mEq/L)   Glucose, Bld 166 (*) 70 - 99 (mg/dL)   BUN 48 (*) 6 - 23 (mg/dL)   Creatinine, Ser 3.66 (*) 0.50 - 1.35 (mg/dL)   Calcium 8.0 (*) 8.4 - 10.5 (mg/dL)   GFR calc non Af Amer 32 (*) >90 (mL/min)   GFR calc Af Amer 37 (*) >90 (mL/min)  GLUCOSE, CAPILLARY  Component Value Range   Glucose-Capillary 164 (*) 70 - 99 (mg/dL)   Comment 1 Notify RN    GLUCOSE, CAPILLARY      Component Value Range   Glucose-Capillary 158 (*) 70 - 99 (mg/dL)  GLUCOSE, CAPILLARY      Component Value Range   Glucose-Capillary 164 (*) 70 - 99 (mg/dL)  PROTIME-INR      Component Value Range   Prothrombin Time 26.0 (*) 11.6 - 15.2 (seconds)   INR 2.34 (*) 0.00 - 1.49   RENAL FUNCTION PANEL       Component Value Range   Sodium 132 (*) 135 - 145 (mEq/L)   Potassium 3.0 (*) 3.5 - 5.1 (mEq/L)   Chloride 95 (*) 96 - 112 (mEq/L)   CO2 26  19 - 32 (mEq/L)   Glucose, Bld 184 (*) 70 - 99 (mg/dL)   BUN 44 (*) 6 - 23 (mg/dL)   Creatinine, Ser 1.47 (*) 0.50 - 1.35 (mg/dL)   Calcium 8.0 (*) 8.4 - 10.5 (mg/dL)   Phosphorus 2.9  2.3 - 4.6 (mg/dL)   Albumin 2.1 (*) 3.5 - 5.2 (g/dL)   GFR calc non Af Amer 36 (*) >90 (mL/min)   GFR calc Af Amer 42 (*) >90 (mL/min)  CBC      Component Value Range   WBC 9.4  4.0 - 10.5 (K/uL)   RBC 3.23 (*) 4.22 - 5.81 (MIL/uL)   Hemoglobin 9.7 (*) 13.0 - 17.0 (g/dL)   HCT 82.9 (*) 56.2 - 52.0 (%)   MCV 88.5  78.0 - 100.0 (fL)   MCH 30.0  26.0 - 34.0 (pg)   MCHC 33.9  30.0 - 36.0 (g/dL)   RDW 13.0  86.5 - 78.4 (%)   Platelets 150  150 - 400 (K/uL)  GLUCOSE, CAPILLARY      Component Value Range   Glucose-Capillary 182 (*) 70 - 99 (mg/dL)  GLUCOSE, CAPILLARY      Component Value Range   Glucose-Capillary 166 (*) 70 - 99 (mg/dL)  GLUCOSE, CAPILLARY      Component Value Range   Glucose-Capillary 172 (*) 70 - 99 (mg/dL)   Orders Only on 69/62/9528  Component Date Value Range Status  . Squamous Epithelial / LPF  05/03/2011 FEW* RARE Final  . WBC, UA (WBC/hpf) 05/03/2011 3-6  <3 Final  . Bacteria, UA  05/03/2011 FEW* RARE Final  . Urine-Other  05/03/2011 MUCOUS PRESENT   Final    X-Rays:Dg Chest 2 View  05/14/2011  *RADIOLOGY REPORT*  Clinical Data: Fever  CHEST - 2 VIEW  Comparison: 05/03/2011  Findings: Both views degraded by patient positioning.  2 lateral views are essentially nondiagnostic.  The frontal view is degraded by AP portable positioning.  The patient is minimally rotated.  Mild cardiomegaly.  No definite pleural effusion. No pneumothorax. Low lung volumes with resultant pulmonary interstitial prominence. Limited evaluation of the left lung base secondary to overlying soft tissues.  IMPRESSION:  1. Mildly degraded exam (primarily lateral  view), secondary to patient positioning. 2.  Cardiomegaly and low lung volumes, without definite acute process.  Original Report Authenticated By: Consuello Bossier, M.D.   Dg Chest 2 View  05/03/2011  *RADIOLOGY REPORT*  Clinical Data: Preoperative cardiopulmonary evaluation.  CHEST - 2 VIEW  Comparison: 04/23/2010.  Findings: The cardiac silhouette is normal size and shape. Ectasia and nonaneurysmal calcification of the thoracic aorta are seen. Mediastinal and hilar contours appear stable.  There is slight prominence of the reticular interstitial markings.  This is unchanged.  No acute infiltrative densities are seen.  There is flattening of the diaphragm on lateral image with overall hyperinflation configuration. No pleural abnormality is evident. Minimal degenerative disc disease and degenerative spondylosis changes are seen.  IMPRESSION: Overall hyperinflation configuration.  Chronic increase in prominence of reticular interstitial markings.  No consolidation, pleural effusion or other acute process.  Original Report Authenticated By: Crawford Givens, M.D.   Ap Pelvis Hip  05/11/2011  *RADIOLOGY REPORT*  Clinical Data: Postop ORIF right femur fracture in patient with right hip prosthesis.  PELVIS - 1-2 VIEW 05/11/2011:  Comparison: Portable right femur x-rays earlier same date.  Findings: Interval ORIF of the mid shaft femur fracture with cerclage wires.  Femur incompletely imaged, but anatomic alignment of the visualized femur in the AP projection .  Surgical drain in place.  IMPRESSION: ORIF of the femur fracture with cerclage wires.  Femur incompletely imaged, but anatomic alignment in the AP projection of the visualized femur.  Original Report Authenticated By: Arnell Sieving, M.D.   Dg Hip Complete Right  05/03/2011  *RADIOLOGY REPORT*  Clinical Data: Preop total hip replacement.  RIGHT HIP - COMPLETE 2+ VIEW  Comparison: None.  Findings: Bilateral total hip prosthesis are present, with the patient  scheduled for removal on the right.  There is considerable loosening of the femoral component. Bones of the pelvis are grossly unremarkable.  IMPRESSION:  Preop right total hip replacement/revision as described.  Original Report Authenticated By: Elsie Stain, M.D.   Dg Femur Right  05/11/2011  *RADIOLOGY REPORT*  Clinical Data: Intraoperative right hip replacement  RIGHT FEMUR - 2 VIEW  Comparison: 05/03/2011  Findings: Interval revision of prior right total hip arthroplasty.  Fracture of the anterolateral mid right femoral shaft.  As a result, the distal femoral stem is eccentrically positioned in the femoral shaft.  Prior total knee arthroplasty.  IMPRESSION: Fracture of the anterolateral mid right femoral shaft.  Revision of right total hip arthroplasty.  Distal femoral stem is eccentrically positioned.  Original Report Authenticated By: Charline Bills, M.D.   US Renal Port  05/15/2011  *RADIOLOGY REPORT*  Clinical Data: Acute renal failure.  RENAL/URINARY TRACT ULTRASOUND COMPLETE  Comparison:  None.  Findings:  Right Kidney:  10.9 cm.  Increased cortical echotexture compatible with medical renal disease.  No hydronephrosis.  No obstruction.  Left Kidney:  10.4 cm.  Increased renal cortical echotexture compatible with medical renal disease.  No hydronephrosis or obstruction.  Bladder:  Decompressed with urinary bladder.  Obese body habitus degrades the study.  IMPRESSION: Echogenic renal cortex bilaterally without obstruction compatible with medical renal disease.  Original Report Authenticated By: Andreas Newport, M.D.   Dg Chest Port 1 View  05/15/2011  *RADIOLOGY REPORT*  Clinical Data: Hypoxemia.  PORTABLE CHEST - 1 VIEW  Comparison: 05/14/2011.  Findings: Mild pulmonary edema.  No segmental consolidation. Cardiomegaly.  No gross pneumothorax. Mildly tortuous aorta. Calcified aortic knob.  IMPRESSION: Mild pulmonary edema.  Cardiomegaly.  Original Report Authenticated By: Fuller Canada,  M.D.    EKG: Orders placed in visit on 05/03/11  . EKG 12-LEAD  . EKG 12-LEAD     Hospital Course:  STAT Dictation [phone]   Discharge Medications: Prior to Admission medications   Medication Sig Start Date End Date Taking? Authorizing Provider  carbidopa-levodopa-entacapone (STALEVO) 25-100-200 MG per tablet Take 1 tablet by mouth 3 (three) times daily.    Yes Historical Provider, MD  donepezil (ARICEPT) 5 MG tablet Take 5 mg  by mouth at bedtime.    Yes Historical Provider, MD  furosemide (LASIX) 40 MG tablet Take 40 mg by mouth every morning.    Yes Historical Provider, MD  glimepiride (AMARYL) 2 MG tablet Take 2 mg by mouth daily. Pt takes at 2 pm    Yes Historical Provider, MD  guaiFENesin-dextromethorphan (ROBITUSSIN DM) 100-10 MG/5ML syrup Take 5 mLs by mouth 3 (three) times daily as needed. cough   Yes Historical Provider, MD  ketoconazole (NIZORAL) 2 % cream Apply 1 application topically daily as needed. itching   Yes Historical Provider, MD  loratadine (CLARITIN) 10 MG tablet Take 10 mg by mouth every morning.    Yes Historical Provider, MD  mometasone (NASONEX) 50 MCG/ACT nasal spray Place 2 sprays into the nose daily as needed. Allergies     Yes Historical Provider, MD  niacin 500 MG tablet Take 500 mg by mouth 2 (two) times daily with a meal.     Yes Historical Provider, MD  olmesartan (BENICAR) 20 MG tablet Take 20 mg by mouth every morning.    Yes Historical Provider, MD  Olopatadine HCl (PATADAY) 0.2 % SOLN Place 1 drop into both eyes daily as needed. Dry eye   Yes Historical Provider, MD  OVER THE COUNTER MEDICATION Take 1 tablet by mouth 3 (three) times daily. Lithium Aspartate 5 mg    Yes Historical Provider, MD  potassium chloride SA (K-DUR,KLOR-CON) 20 MEQ tablet Take 20 mEq by mouth every morning.    Yes Historical Provider, MD  simvastatin (ZOCOR) 40 MG tablet Take 40 mg by mouth at bedtime.    Yes Historical Provider, MD  temazepam (RESTORIL) 30 MG capsule Take  30 mg by mouth at bedtime.    Yes Historical Provider, MD  traMADol (ULTRAM) 50 MG tablet Take 50 mg by mouth every 8 (eight) hours as needed. Pain     Yes Historical Provider, MD  triamterene-hydrochlorothiazide (MAXZIDE) 75-50 MG per tablet Take 1 tablet by mouth every morning.    Yes Historical Provider, MD  acetaminophen (TYLENOL) 325 MG tablet Take 2 tablets (650 mg total) by mouth every 6 (six) hours as needed (or Fever >/= 101). 05/18/11 05/28/11  Alexzandrew Perkins, PA  amoxicillin-clavulanate (AUGMENTIN) 875-125 MG per tablet Take 1 tablet by mouth every 12 (twelve) hours. 05/18/11 05/28/11  Alexzandrew Perkins, PA  ipratropium-albuterol (DUONEB) 0.5-2.5 (3) MG/3ML SOLN Take 3 mLs by nebulization every 4 (four) hours as needed. wheezing    Historical Provider, MD  magnesium hydroxide (MILK OF MAGNESIA) 400 MG/5ML suspension Take 30 mLs by mouth every 12 (twelve) hours as needed for constipation. 05/18/11 05/28/11  Alexzandrew Perkins, PA  multivitamin-lutein (OCUVITE-LUTEIN) CAPS Take 1 capsule by mouth daily.      Historical Provider, MD  ondansetron (ZOFRAN) 4 MG tablet Take 1 tablet (4 mg total) by mouth every 6 (six) hours as needed for nausea. 05/18/11 05/25/11  Alexzandrew Perkins, PA  polyethylene glycol (MIRALAX / GLYCOLAX) packet Take 17 g by mouth 2 (two) times daily. 05/18/11 05/21/11  Alexzandrew Perkins, PA    Diet: carb modified - medium  Activity:NWB No bending hip over 90 degrees- A "L" Angle Do not cross legs Do not let foot roll inward  When turning these patients a pillow should be placed between the patient's legs to prevent crossing.  Patients should have the affected knee fully extended when trying to sit or stand from all surfaces to prevent excessive hip flexion.  When ambulating and turning toward the affected side  the affected leg should have the toes turned out prior to moving the walker and the rest of patient's body as to prevent internal rotation/ turning in  of the leg.  Abduction pillows are the most effective way to prevent a patient from not crossing legs or turning toes in at rest. If an abduction pillow is not ordered placing a regular pillow length wise between the patient's legs is also an effective reminder.  It is imperative that these precautions be maintained so that the surgical hip does not dislocate.    Follow-up:in 1 week  Disposition: [facility]  Discharged Condition: stable   Discharge Orders    Future Orders Please Complete By Expires   Diet - low sodium heart healthy      Call MD / Call 911      Comments:   If you experience chest pain or shortness of breath, CALL 911 and be transported to the hospital emergency room.  If you develope a fever above 101 F, pus (white drainage) or increased drainage or redness at the wound, or calf pain, call your surgeon's office.   Constipation Prevention      Comments:   Drink plenty of fluids.  Prune juice may be helpful.  You may use a stool softener, such as Colace (over the counter) 100 mg twice a day.  Use MiraLax (over the counter) for constipation as needed.   Increase activity slowly as tolerated      Weight Bearing as taught in Physical Therapy      Comments:   Use a walker or crutches as instructed.   Discharge instructions      Comments:   Pick up stool softner and laxative for home. Do not submerge incision under water. May shower. Hip precautions.  Total Hip Protocol. Touch Down Weight Bearing ONLY - Do Not Advance WB Status   Driving restrictions      Comments:   No driving   Lifting restrictions      Comments:   No lifting   Change dressing      Comments:   [facility] staff please change the dressing daily with sterile 4 x 4 inch gauze dressing and paper tape.   TED hose      Comments:   Use stockings (TED hose) for 3 weeks on both leg(s).  Staff may remove them at night for sleeping.     Current Discharge Medication List    START  taking these medications   Details  acetaminophen (TYLENOL) 325 MG tablet Take 2 tablets (650 mg total) by mouth every 6 (six) hours as needed (or Fever >/= 101). Qty: 30 tablet, Refills: 0    amoxicillin-clavulanate (AUGMENTIN) 875-125 MG per tablet Take 1 tablet by mouth every 12 (twelve) hours. Qty: 14 tablet, Refills: 0    magnesium hydroxide (MILK OF MAGNESIA) 400 MG/5ML suspension Take 30 mLs by mouth every 12 (twelve) hours as needed for constipation. Qty: 360 mL, Refills: 0    ondansetron (ZOFRAN) 4 MG tablet Take 1 tablet (4 mg total) by mouth every 6 (six) hours as needed for nausea. Qty: 20 tablet, Refills: 0    polyethylene glycol (MIRALAX / GLYCOLAX) packet Take 17 g by mouth 2 (two) times daily. Qty: 14 each, Refills: 0      CONTINUE these medications which have NOT CHANGED   Details  carbidopa-levodopa-entacapone (STALEVO) 25-100-200 MG per tablet Take 1 tablet by mouth 3 (three) times daily.     donepezil (ARICEPT) 5  MG tablet Take 5 mg by mouth at bedtime.     furosemide (LASIX) 40 MG tablet Take 40 mg by mouth every morning.     glimepiride (AMARYL) 2 MG tablet Take 2 mg by mouth daily. Pt takes at 2 pm     guaiFENesin-dextromethorphan (ROBITUSSIN DM) 100-10 MG/5ML syrup Take 5 mLs by mouth 3 (three) times daily as needed. cough    ketoconazole (NIZORAL) 2 % cream Apply 1 application topically daily as needed. itching    loratadine (CLARITIN) 10 MG tablet Take 10 mg by mouth every morning.     mometasone (NASONEX) 50 MCG/ACT nasal spray Place 2 sprays into the nose daily as needed. Allergies      niacin 500 MG tablet Take 500 mg by mouth 2 (two) times daily with a meal.      olmesartan (BENICAR) 20 MG tablet Take 20 mg by mouth every morning.     Olopatadine HCl (PATADAY) 0.2 % SOLN Place 1 drop into both eyes daily as needed. Dry eye    OVER THE COUNTER MEDICATION Take 1 tablet by mouth 3 (three) times daily. Lithium Aspartate 5 mg     potassium  chloride SA (K-DUR,KLOR-CON) 20 MEQ tablet Take 20 mEq by mouth every morning.     simvastatin (ZOCOR) 40 MG tablet Take 40 mg by mouth at bedtime.     temazepam (RESTORIL) 30 MG capsule Take 30 mg by mouth at bedtime.     traMADol (ULTRAM) 50 MG tablet Take 50 mg by mouth every 8 (eight) hours as needed. Pain      triamterene-hydrochlorothiazide (MAXZIDE) 75-50 MG per tablet Take 1 tablet by mouth every morning.     ipratropium-albuterol (DUONEB) 0.5-2.5 (3) MG/3ML SOLN Take 3 mLs by nebulization every 4 (four) hours as needed. wheezing    multivitamin-lutein (OCUVITE-LUTEIN) CAPS Take 1 capsule by mouth daily.        STOP taking these medications     cholecalciferol (VITAMIN D) 1000 UNITS tablet      HYDROcodone-acetaminophen (NORCO) 5-325 MG per tablet      metFORMIN (GLUCOPHAGE) 500 MG tablet      PRESCRIPTION MEDICATION      aspirin EC 81 MG tablet      Multiple Vitamins-Minerals (MULTIVITAMINS THER. W/MINERALS) TABS      Omega-3 Fatty Acids (FISH OIL) 1200 MG CAPS      vitamin C (ASCORBIC ACID) 500 MG tablet        Follow-up Information    Follow up with ALUISIO,FRANK V. Make an appointment in 1 week. [facility] staff needs to call office to make appt. and transportation arrangements for patient.)    Contact information:   [facility] [place] [address] [place] [address] [phone]          Signed: Patrica Duel 05/18/2011, 9:39 AM

## 2011-05-18 NOTE — Progress Notes (Signed)
Pt spit up blood tinged sputum just now, pt states "he just doesn't feel so good".  There is a slight wheeze on rt side of lungs.  Avel Peace, P.A.  On unit, notified him of pt status.  He stated to call Medical and have them follow up on this.  Paged MD Samptini to inform him to see pt. Waiting to hear from him now.

## 2011-05-18 NOTE — Discharge Summary (Signed)
NAMEELSIE, SAKUMA                 ACCOUNT NO.:  [id number]  MEDICAL RECORD NO.:  [id number]  LOCATION:  1603                         FACILITY:  [facility]  PHYSICIAN:  Alexzandrew L. 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  He had a total hip done many years ago, became gradually loose and been seen in the office, felt to require revision surgery.  So, he was admitted per Dr. Lequita Halt, was taken to the operating room on May 11, 2011, and underwent the above-stated procedure without complication.  The patient tolerated the procedure well, later transferred to recovery room from orthopedic floor, given the first postoperative antibiotics.  Surgery was fairly extensive; and due to the nature of the surgery, we only allowed him to be touchdown weightbearing.  He was put on Coumadin for DVT prophylaxis.  He was seen on postop day #1 following the surgery, having moderate to severe pain.  His sodium was noted to be low.  He had some chronic renal insufficiency, which was noted to be preop.  He had a creatinine level, which was borderline.  He was placed back on all of his home meds.  Again, he was only touchdown weightbearing, hip precautions were ordered.  He was still a little bit better by day #2, had decent output, but he was positive on his fluids at that time.  His hemoglobin was stable and the sodium was a little low.  His creatinine gone up on postop day to a little bit from 1.28 to 1.79.  By day #3, the creatinine had bumped even further up to 1.91 indicating some acute renal failure.  He was also having some continued low sodium levels, and Poor output on postop day #3, did call [facility], the medical team was consulted to assist with better  fluid management, and the electrolyte imbalance and the acute renal failure.  The hemoglobin was noted to have declined a little bit down to 8.3, and due to the drop in hemoglobin, the extensive nature of the surgery, the electrolyte imbalance, and also the decrease in renal function, and increased creatinine, it was felt he would benefit from undergoing blood transfusion.  He was given 2 units, was transfused on postop day #4.  It was felt that he did have some renal failure.  He also had some fever at that time.  A chest x-ray was ordered, which was negative for pneumonia. Blood cultures taken at that time were also negative, results can be found in the chart.  He was placed on Zosyn for coverage per medicine team.  At that point, his sodium level stabilized, but he did have some confusion which was felt to be some type of encephalopathy or postop delirium.  He remained in the hospital due to medical issues.  By postop day #5, he was having a fair amount of pain, but due to his delirium, his narcotics were discontinued.  Hemoglobin did improve following the transfusion.  He is back up  to 9.2.  He is a little bit more awake by postop day #5.  They felt that the delirium was more secondary to the fever, and felt that his renal failure could be secondary to acute tubular necrosis.  With that in mind, Nephrology was consulted, and the patient was seen by Dr. Hyman Hopes and Dr. Eliott Nine.  By this time, the creatinine had peaked and actually started to stabilize, it come back down, felt it was more prerenal with the issues of the anemia compounded by his ARB therapy.  His metformin was held, and actually discontinued, and will not be continued following the surgery.  They recommended keeping him off the ARB and blood pressure meds.  His hyponatremia was stable at that point, and Dr. Eliott Nine felt that he should stay on empiric antibiotics with his fever.  He was reevaluated on postop day #6.  He was  still quite sleepy and drowsy, but his creatinine had improved.  His hemoglobin was stable at that point, this was on Tuesday.  It was felt that the patient would need to go to a skilled facility, and we had already had social work involved, a bed was found over at [facility].  It was felt that the patient will be transferred over once he became stable. He had been seen by Nephrology on postop day #6, and at that point, they felt that his creatinine had continued to improve and he was back close to his preop baseline of 1.8, and felt that most likely it was due to the hemodynamic instability with the acute blood loss, and also in the setting of his ARB.  Again, Dr. Eliott Nine recommends Staying off of his ARBs.  He was still a little bit overloaded on his fluid, and they resumed his Lasix, which he took 40 mg a day.  He stated at that point as long as his renal standpoint remained stable, he probably able to go over to the facility.  He was also seen by Medicine on May 17, 2011, and they agreed that he might be able to go to a skilled facility on May 18, 2011.  His renal function was stable.  He was seen on rounds on May 18, 2011, and it was noted that his sodium was stable from the day before.  His hemoglobin was stable at 9.7.  The creatinine actually improved from 1.91 to 1.74.  His BUN also has improvement from 48 to 44, so his renal function was stabilized.  His sodium was stable.  His hemoglobin remained stable.  From a mental standpoint, the patient was quite drowsy on the 27th, but on the following rounds on the 28th, the patient was near back to baseline, he was much alert and actually in good spirits and was joking on rounds.  He seemed to be back to his baseline, and with that in mind, we started making arrangements, and since he had met his qualifications of stable sodium and renal function, we would transfer him over to [facility] at that time.     Alexzandrew  L. Perkins, P.A.C.     ALP/MEDQ  D:  05/18/2011  T:  05/18/2011  Job:  [id number]  cc:   Ollen Gross, M.D. Fax: [phone]  [facility] Eliott Nine, M.D. Fax: [phone]  [facility]

## 2011-05-18 NOTE — Consult Note (Signed)
[facility] CONSULT NOTE  Interval h/o:-  Chart reviewed in detail-Patient initially consulted upon by Orthopedics D#3 s/p R TH replacement (now D#7), as pt became Hyponatremic with Vol overload, creat peak at 1.94, was non-oliguric and thought to have sustained AKI from multiple causes (abla/ARB/biguanide)-recieved PRBC x 2 and had low grade fevers as well-which have since resolved  Noted today by Rn to have had some cough with greenish tinged sputum with possible blood streaks. Poor PO intake today, and has been feeling "bad" per RN-had loose stools over the past 2 days, but none recently. Has had some stomach aching as well, but then had "dry Heaves" and a quarter sized spots of what might have been mucous/blood   Recommendations  Highly doubt he has hematemesis-he is on chronic nasal canula oxygen without humdity-he does have a wheeze, but is on PRN nebs, which I have changed to scheduled Combivent q 6 for the short term  Has never used Oxygen as an outpatient, so unclear that he is O2 dependant.    Patient Active Hospital Problem List: DIABETES MELLITUS (03/04/2008)   Assessment: CBG's acceptable on SSI-ranges 170's to 200's.  Continue Glimepiride on d/c home  Acute renal failure (05/14/2011)   Assessment: Resolving-Potassium slightly low today-would replace chronically.  [facility].  Continue lasix per Renal rec's  Hyponatremia (05/14/2011)   Assessment: likely multifactorial-Tea-toast potomania + multiple other causes including meds and Hypotesnion 2/2 to Surgery-has Pre-renal azotemia based on Urine studies done 11.24.  Will need recheck in 2-3 days.  Would increase solute in diet as much as possible-am placing on Ensure  Thrombocytopenia (05/14/2011)   Assessment: Resolving TCP-Pathologist smear review was ordered x 2 and cancelled x 2 as well.  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  Needs aggressive PT/OT  Would follow P CXR done today-->does not need other antibiotics at present time given no fever, chills or other s/s of infection--Started Zosyn 11.24 and changed to Augmentin--would d/c after 10-14 days full therapy   If CXR normal-stable for d/c to SNF in am from my standpoint   Laurajean Hosek,JAI 05/18/2011, 2:52 PM   Medications: Scheduled Meds:   . amoxicillin-clavulanate  1 tablet Oral Q12H  . antiseptic oral rinse  15 mL Mouth Rinse BID  . carbidopa-levodopa  1 tablet Oral TID  . docusate sodium  100 mg Oral BID  . donepezil  5 mg Oral QHS  . entacapone  200 mg Oral TID  . fluticasone  1 spray Each Nare Daily  . furosemide  80 mg Intravenous Once  . furosemide  40 mg Oral Daily  . insulin aspart  0-15 Units Subcutaneous TID WC  . loratadine  10 mg Oral Daily  . niacin  500 mg Oral BID WC  . polyethylene glycol  17 g Oral BID  . simvastatin  40 mg Oral QHS  . warfarin  7.5 mg Oral ONCE-1800  . DISCONTD: piperacillin-tazobactam (ZOSYN)  IV  3.375 g Intravenous Q8H   Continuous Infusions:  PRN Meds:.acetaminophen, acetaminophen, albuterol, bisacodyl, bisacodyl, ipratropium, ketoconazole, magnesium hydroxide, menthol-cetylpyridinium, olopatadine, ondansetron (ZOFRAN) IV, ondansetron, phenol, traMADol  Objective: Weight change:   Intake/Output Summary (Last 24 hours) at 05/18/11 1452 Last data filed at  05/18/11 1015  Gross per 24 hour  Intake    240 ml  Output   1203 ml  Net   -963 ml     HEENT Obese, baseline low frq tremor.  Thick neck, mallampati 2 CHEST Wheeze L>>R side Post basal area.  No TVR/no TVF CARDS S1 S2 no M/R/G, RRR ABD Obese nt/nd  NEURO Intact, unusual affect SKIN 30 cm scar R hip area, clean and closed by 2/2 intention  Lab  Results: Pertinent labs and imaging all reviewed today   Studies/Results: Dg Chest 2 View  05/14/2011  *RADIOLOGY REPORT*  Clinical Data: Fever  CHEST - 2 VIEW  Comparison: 05/03/2011  Findings: Both views degraded by patient positioning.  2 lateral views are essentially nondiagnostic.  The frontal view is degraded by AP portable positioning.  The patient is minimally rotated.  Mild cardiomegaly.  No definite pleural effusion. No pneumothorax. Low lung volumes with resultant pulmonary interstitial prominence. Limited evaluation of the left lung base secondary to overlying soft tissues.  IMPRESSION:  1. Mildly degraded exam (primarily lateral view), secondary to patient positioning. 2.  Cardiomegaly and low lung volumes, without definite acute process.  Original Report Authenticated By: Consuello Bossier, M.D.   Dg Chest 2 View  05/03/2011  *RADIOLOGY REPORT*  Clinical Data: Preoperative cardiopulmonary evaluation.  CHEST - 2 VIEW  Comparison: 04/23/2010.  Findings: The cardiac silhouette is normal size and shape. Ectasia and nonaneurysmal calcification of the thoracic aorta are seen. Mediastinal and hilar contours appear stable.  There is slight prominence of the reticular interstitial markings.  This is unchanged.  No acute infiltrative densities are seen.  There is flattening of the diaphragm on lateral image with overall hyperinflation configuration. No pleural abnormality is evident. Minimal degenerative disc disease and degenerative spondylosis changes are seen.  IMPRESSION: Overall hyperinflation configuration.  Chronic increase in prominence of reticular interstitial markings.  No consolidation, pleural effusion or other acute process.  Original Report Authenticated By: Crawford Givens, M.D.   Ap Pelvis Hip  05/11/2011  *RADIOLOGY REPORT*  Clinical Data: Postop ORIF right femur fracture in patient with right hip prosthesis.  PELVIS - 1-2 VIEW 05/11/2011:  Comparison: Portable right femur x-rays earlier  same date.  Findings: Interval ORIF of the mid shaft femur fracture with cerclage wires.  Femur incompletely imaged, but anatomic alignment of the visualized femur in the AP projection .  Surgical drain in place.  IMPRESSION: ORIF of the femur fracture with cerclage wires.  Femur incompletely imaged, but anatomic alignment in the AP projection of the visualized femur.  Original Report Authenticated By: Arnell Sieving, M.D.   Dg Hip Complete Right  05/03/2011  *RADIOLOGY REPORT*  Clinical Data: Preop total hip replacement.  RIGHT HIP - COMPLETE 2+ VIEW  Comparison: None.  Findings: Bilateral total hip prosthesis are present, with the patient scheduled for removal on the right.  There is considerable loosening of the femoral component. Bones of the pelvis are grossly unremarkable.  IMPRESSION:  Preop right total hip replacement/revision as described.  Original Report Authenticated By: Elsie Stain, M.D.   Dg Femur Right  05/11/2011  *RADIOLOGY REPORT*  Clinical Data: Intraoperative right hip replacement  RIGHT FEMUR - 2 VIEW  Comparison: 05/03/2011  Findings: Interval revision of prior right total hip arthroplasty.  Fracture of the anterolateral mid right femoral shaft.  As a result, the distal femoral stem is eccentrically positioned in the femoral shaft.  Prior total knee arthroplasty.  IMPRESSION: Fracture of the anterolateral  mid right femoral shaft.  Revision of right total hip arthroplasty.  Distal femoral stem is eccentrically positioned.  Original Report Authenticated By: Charline Bills, M.D.   US Renal Port  05/15/2011  *RADIOLOGY REPORT*  Clinical Data: Acute renal failure.  RENAL/URINARY TRACT ULTRASOUND COMPLETE  Comparison:  None.  Findings:  Right Kidney:  10.9 cm.  Increased cortical echotexture compatible with medical renal disease.  No hydronephrosis.  No obstruction.  Left Kidney:  10.4 cm.  Increased renal cortical echotexture compatible with medical renal disease.  No  hydronephrosis or obstruction.  Bladder:  Decompressed with urinary bladder.  Obese body habitus degrades the study.  IMPRESSION: Echogenic renal cortex bilaterally without obstruction compatible with medical renal disease.  Original Report Authenticated By: Andreas Newport, M.D.   Dg Chest Port 1 View  05/15/2011  *RADIOLOGY REPORT*  Clinical Data: Hypoxemia.  PORTABLE CHEST - 1 VIEW  Comparison: 05/14/2011.  Findings: Mild pulmonary edema.  No segmental consolidation. Cardiomegaly.  No gross pneumothorax. Mildly tortuous aorta. Calcified aortic knob.  IMPRESSION: Mild pulmonary edema.  Cardiomegaly.  Original Report Authenticated By: Fuller Canada, M.D.

## 2011-05-19 LAB — PROTIME-INR: INR: 2.65 — ABNORMAL HIGH (ref 0.00–1.49)

## 2011-05-19 MED ORDER — POTASSIUM CHLORIDE CRYS ER 20 MEQ PO TBCR: 20.0000 meq | EXTENDED_RELEASE_TABLET | Freq: Every day | ORAL | Status: DC

## 2011-05-19 MED ORDER — IPRATROPIUM-ALBUTEROL 18-103 MCG/ACT IN AERO: 2.0000 | INHALATION_SPRAY | Freq: Four times a day (QID) | RESPIRATORY_TRACT | Status: DC

## 2011-05-19 NOTE — Progress Notes (Signed)
ANTICOAGULATION CONSULT NOTE - Follow Up Consult  Pharmacy Consult for Coumadin Indication: VTE prophylaxis s/p right total hip revision   No Known Allergies  Patient Measurements: Height: 5\' 6"  (167.6 cm) Weight: 297 lb 13.5 oz (135.1 kg) IBW/kg (Calculated) : 63.8   Vital Signs: Temp: 98.5 F (36.9 C) (11/29 0610) Temp src: Oral (11/29 0610) BP: 147/69 mmHg (11/29 0930) Pulse Rate: 62  (11/29 0930)  Labs:  Basename 05/19/11 0423 05/18/11 0450 05/17/11 0405  HGB -- 9.7* 9.7*  HCT -- 28.6* 28.2*  PLT -- 150 137*  APTT -- -- --  LABPROT 28.7* 26.0* 22.1*  INR 2.65* 2.34* 1.90*  HEPARINUNFRC -- -- --  CREATININE -- 1.74* 1.91*  CKTOTAL -- -- --  CKMB -- -- --  TROPONINI -- -- --   Estimated Creatinine Clearance: 46.4 ml/min (by C-G formula based on Cr of 1.74).   Medications:  Scheduled:     . albuterol-ipratropium  2 puff Inhalation Q6H  . amoxicillin-clavulanate  1 tablet Oral Q12H  . antiseptic oral rinse  15 mL Mouth Rinse BID  . carbidopa-levodopa  1 tablet Oral TID  . docusate sodium  100 mg Oral BID  . donepezil  5 mg Oral QHS  . entacapone  200 mg Oral TID  . fluticasone  1 spray Each Nare Daily  . furosemide  40 mg Oral Daily  . insulin aspart  0-15 Units Subcutaneous TID WC  . loratadine  10 mg Oral Daily  . niacin  500 mg Oral BID WC  . polyethylene glycol  17 g Oral BID  . potassium chloride  20 mEq Oral Daily  . simvastatin  40 mg Oral QHS  . warfarin  7.5 mg Oral ONCE-1800  Anticoagulants: Coumadin 7.5mg  on 11/21- 11/28  Assessment: 77 yo male s/p R total hip revision started on coumadin 11/21. INR therapeutic  Goal of Therapy:  INR 2-3   Plan:  Continue Coumadin 7.5 mg PO Recommend next INR tomorrow  Lynann Beaver PharmD  Pager [phone] 05/19/2011 1:04 PM

## 2011-05-19 NOTE — Progress Notes (Signed)
D/C instructions reviewed w/ pt. Pt verbalizes understanding, all ques answered. Pt transferred to [facility] via ambulance in stable condition. Transfer paperwork w/ pt. Pt wife took pt personal belongings with her to Rehab center.

## 2011-05-19 NOTE — Progress Notes (Signed)
Pt D/C to [facility] for SNF placement today via [facility] transport.

## 2011-05-19 NOTE — Discharge Summary (Signed)
Physician Discharge Summary    ADDENDUM DISCHARGE SUMMARY - Please see attach to the previous summary from 05/18/2011.   Patient ID: Preston Carter MRN: [id number] DOB/AGE: Jul 03, 1931 77 y.o.  Admit date: 05/11/2011 Discharge date: 05/19/2011  Primary Diagnosis: Failed right THA   Admission Diagnoses: Past Medical History  Diagnosis Date  . Hypertension   . Stroke 05-03-11    TIA- more than 20 yrs ago.  . Asthma   . Shortness of breath 05-03-11    SOB with exertion only  . Diabetes mellitus 05-03-11    Oral meds used-Dabetes x5 yrs-CBG's (145)  . Neuromuscular disorder 05-03-11    Dx. Parkinson's -essential tremors-generally, ambulates with walker  . Arthritis 05-03-11    most joints- Bil. hips/knees replaced in the past  . Skin cancer 05-03-11    Past hx. skin cancer lesions-removed  . DEMENTIA 05-03-11    hx. Alzheimers-tx. Aricept    Discharge Diagnoses:  Active Problems:  DIABETES MELLITUS  Acute renal failure  Hyponatremia  Thrombocytopenia  Fever  Anemia  COPD-stage 2 Acute Blood Loss Anemia  S/P Transfusion  Postop Hypokalemia  AMS/Postop Delirium,Resolving   Procedure: Procedure(s) (LRB): TOTAL HIP REVISION (Right)   Consults: nephrology and [facility]  HPI:  Please see prev. D/C Summary    Laboratory Data: Results for orders placed during the hospital encounter of 05/11/11  TYPE AND SCREEN      Component Value Range   ABO/RH(D) O NEG     Antibody Screen NEG     Sample Expiration 05/14/2011     Unit Number [id number]     Blood Component Type RBC LR PHER1     Unit division 00     Status of Unit ISSUED,FINAL     Transfusion Status OK TO TRANSFUSE     Crossmatch Result Compatible     Unit Number [id number]     Blood Component Type RBC LR PHER1     Unit division 00     Status of Unit ISSUED,FINAL     Transfusion Status OK TO TRANSFUSE     Crossmatch Result Compatible    GLUCOSE, CAPILLARY      Component Value Range   Glucose-Capillary 120 (*) 70 - 99 (mg/dL)  ABO/RH      Component Value Range   ABO/RH(D) O NEG    GLUCOSE, CAPILLARY      Component Value Range   Glucose-Capillary 156 (*) 70 - 99 (mg/dL)   Comment 1 Documented in Chart     Comment 2 Notify RN    PROTIME-INR      Component Value Range   Prothrombin Time 14.6  11.6 - 15.2 (seconds)   INR 1.12  0.00 - 1.49   CBC      Component Value Range   WBC 8.0  4.0 - 10.5 (K/uL)   RBC 2.94 (*) 4.22 - 5.81 (MIL/uL)   Hemoglobin 9.2 (*) 13.0 - 17.0 (g/dL)   HCT 66.4 (*) 40.3 - 52.0 (%)   MCV 90.5  78.0 - 100.0 (fL)   MCH 31.3  26.0 - 34.0 (pg)   MCHC 34.6  30.0 - 36.0 (g/dL)   RDW 47.4  25.9 - 56.3 (%)   Platelets 134 (*) 150 - 400 (K/uL)  BASIC METABOLIC PANEL      Component Value Range   Sodium 133 (*) 135 - 145 (mEq/L)   Potassium 4.0  3.5 - 5.1 (mEq/L)   Chloride 98  96 - 112 (mEq/L)   CO2 26  19 - 32 (mEq/L)   Glucose, Bld 178 (*) 70 - 99 (mg/dL)   BUN 25 (*) 6 - 23 (mg/dL)   Creatinine, Ser 4.09  0.50 - 1.35 (mg/dL)   Calcium 8.2 (*) 8.4 - 10.5 (mg/dL)   GFR calc non Af Amer 52 (*) >90 (mL/min)   GFR calc Af Amer 61 (*) >90 (mL/min)  GLUCOSE, CAPILLARY      Component Value Range   Glucose-Capillary 170 (*) 70 - 99 (mg/dL)  GLUCOSE, CAPILLARY      Component Value Range   Glucose-Capillary 178 (*) 70 - 99 (mg/dL)  GLUCOSE, CAPILLARY      Component Value Range   Glucose-Capillary 172 (*) 70 - 99 (mg/dL)   Comment 1 Notify RN    PROTIME-INR      Component Value Range   Prothrombin Time 16.3 (*) 11.6 - 15.2 (seconds)   INR 1.29  0.00 - 1.49   CBC      Component Value Range   WBC 8.8  4.0 - 10.5 (K/uL)   RBC 2.59 (*) 4.22 - 5.81 (MIL/uL)   Hemoglobin 8.1 (*) 13.0 - 17.0 (g/dL)   HCT 81.1 (*) 91.4 - 52.0 (%)   MCV 90.7  78.0 - 100.0 (fL)   MCH 31.3  26.0 - 34.0 (pg)   MCHC 34.5  30.0 - 36.0 (g/dL)   RDW 78.2  95.6 - 21.3 (%)   Platelets 107 (*) 150 - 400 (K/uL)  BASIC METABOLIC PANEL      Component Value Range   Sodium  132 (*) 135 - 145 (mEq/L)   Potassium 3.3 (*) 3.5 - 5.1 (mEq/L)   Chloride 97  96 - 112 (mEq/L)   CO2 28  19 - 32 (mEq/L)   Glucose, Bld 150 (*) 70 - 99 (mg/dL)   BUN 25 (*) 6 - 23 (mg/dL)   Creatinine, Ser 0.86 (*) 0.50 - 1.35 (mg/dL)   Calcium 8.2 (*) 8.4 - 10.5 (mg/dL)   GFR calc non Af Amer 35 (*) >90 (mL/min)   GFR calc Af Amer 40 (*) >90 (mL/min)  GLUCOSE, CAPILLARY      Component Value Range   Glucose-Capillary 162 (*) 70 - 99 (mg/dL)  GLUCOSE, CAPILLARY      Component Value Range   Glucose-Capillary 196 (*) 70 - 99 (mg/dL)  GLUCOSE, CAPILLARY      Component Value Range   Glucose-Capillary 176 (*) 70 - 99 (mg/dL)  PREPARE RBC (CROSSMATCH)      Component Value Range   Order Confirmation ORDER PROCESSED BY BLOOD BANK    GLUCOSE, CAPILLARY      Component Value Range   Glucose-Capillary 201 (*) 70 - 99 (mg/dL)  GLUCOSE, CAPILLARY      Component Value Range   Glucose-Capillary 122 (*) 70 - 99 (mg/dL)  PROTIME-INR      Component Value Range   Prothrombin Time 16.6 (*) 11.6 - 15.2 (seconds)   INR 1.32  0.00 - 1.49   CBC      Component Value Range   WBC 10.0  4.0 - 10.5 (K/uL)   RBC 2.86 (*) 4.22 - 5.81 (MIL/uL)   Hemoglobin 8.8 (*) 13.0 - 17.0 (g/dL)   HCT 57.8 (*) 46.9 - 52.0 (%)   MCV 88.1  78.0 - 100.0 (fL)   MCH 30.8  26.0 - 34.0 (pg)   MCHC 34.9  30.0 - 36.0 (g/dL)   RDW 62.9  52.8 - 41.3 (%)   Platelets 87 (*) 150 -  400 (K/uL)  BASIC METABOLIC PANEL      Component Value Range   Sodium 129 (*) 135 - 145 (mEq/L)   Potassium 3.5  3.5 - 5.1 (mEq/L)   Chloride 95 (*) 96 - 112 (mEq/L)   CO2 26  19 - 32 (mEq/L)   Glucose, Bld 187 (*) 70 - 99 (mg/dL)   BUN 28 (*) 6 - 23 (mg/dL)   Creatinine, Ser 1.61 (*) 0.50 - 1.35 (mg/dL)   Calcium 8.0 (*) 8.4 - 10.5 (mg/dL)   GFR calc non Af Amer 32 (*) >90 (mL/min)   GFR calc Af Amer 37 (*) >90 (mL/min)  GLUCOSE, CAPILLARY      Component Value Range   Glucose-Capillary 173 (*) 70 - 99 (mg/dL)  GLUCOSE, CAPILLARY       Component Value Range   Glucose-Capillary 174 (*) 70 - 99 (mg/dL)  PRO B NATRIURETIC PEPTIDE      Component Value Range   BNP, POC 1672.0 (*) 0 - 450 (pg/mL)  URINALYSIS, ROUTINE W REFLEX MICROSCOPIC      Component Value Range   Color, Urine AMBER (*) YELLOW    APPearance CLOUDY (*) CLEAR    Specific Gravity, Urine 1.020  1.005 - 1.030    pH 5.5  5.0 - 8.0    Glucose, UA NEGATIVE  NEGATIVE (mg/dL)   Hgb urine dipstick LARGE (*) NEGATIVE    Bilirubin Urine NEGATIVE  NEGATIVE    Ketones, ur NEGATIVE  NEGATIVE (mg/dL)   Protein, ur 096 (*) NEGATIVE (mg/dL)   Urobilinogen, UA 0.2  0.0 - 1.0 (mg/dL)   Nitrite NEGATIVE  NEGATIVE    Leukocytes, UA SMALL (*) NEGATIVE   CREATININE, URINE, RANDOM      Component Value Range   Creatinine, Urine 163.6    NA AND K (SODIUM & POTASSIUM), RAND UR      Component Value Range   Sodium, Ur 50     Potassium Urine Timed 26    URINE CULTURE      Component Value Range   Specimen Description URINE, CATHETERIZED     Special Requests NONE     Setup Time [id number]     Colony Count NO GROWTH     Culture NO GROWTH     Report Status 05/15/2011 FINAL    HEMOGLOBIN AND HEMATOCRIT, BLOOD      Component Value Range   Hemoglobin 9.1 (*) 13.0 - 17.0 (g/dL)   HCT 78.2 (*) 95.6 - 52.0 (%)  OSMOLALITY, URINE      Component Value Range   Osmolality, Ur 471  390 - 1090 (mOsm/kg)  URINE MICROSCOPIC-ADD ON      Component Value Range   Squamous Epithelial / LPF RARE  RARE    WBC, UA 3-6  <3 (WBC/hpf)   RBC / HPF 3-6  <3 (RBC/hpf)   Bacteria, UA MANY (*) RARE   GLUCOSE, CAPILLARY      Component Value Range   Glucose-Capillary 147 (*) 70 - 99 (mg/dL)  CULTURE, BLOOD (ROUTINE X 2)      Component Value Range   Specimen Description BLOOD RIGHT HAND  10 ML IN [facility] BOTTLE     Special Requests NONE     Setup Time [id number]     Culture       Value:        BLOOD CULTURE RECEIVED NO GROWTH TO DATE CULTURE WILL BE HELD FOR 5 DAYS BEFORE ISSUING A FINAL NEGATIVE  REPORT   Report Status PENDING  CULTURE, BLOOD (ROUTINE X 2)      Component Value Range   Specimen Description BLOOD RIGHT ARM  10 ML IN [facility] BOTTLE     Special Requests NONE     Setup Time [id number]     Culture       Value:        BLOOD CULTURE RECEIVED NO GROWTH TO DATE CULTURE WILL BE HELD FOR 5 DAYS BEFORE ISSUING A FINAL NEGATIVE REPORT   Report Status PENDING    GLUCOSE, CAPILLARY      Component Value Range   Glucose-Capillary 200 (*) 70 - 99 (mg/dL)  PROTIME-INR      Component Value Range   Prothrombin Time 18.6 (*) 11.6 - 15.2 (seconds)   INR 1.52 (*) 0.00 - 1.49   CBC      Component Value Range   WBC 10.9 (*) 4.0 - 10.5 (K/uL)   RBC 2.66 (*) 4.22 - 5.81 (MIL/uL)   Hemoglobin 8.3 (*) 13.0 - 17.0 (g/dL)   HCT 81.1 (*) 91.4 - 52.0 (%)   MCV 88.7  78.0 - 100.0 (fL)   MCH 31.2  26.0 - 34.0 (pg)   MCHC 35.2  30.0 - 36.0 (g/dL)   RDW 78.2  95.6 - 21.3 (%)   Platelets 108 (*) 150 - 400 (K/uL)  RENAL FUNCTION PANEL      Component Value Range   Sodium 131 (*) 135 - 145 (mEq/L)   Potassium 3.6  3.5 - 5.1 (mEq/L)   Chloride 95 (*) 96 - 112 (mEq/L)   CO2 25  19 - 32 (mEq/L)   Glucose, Bld 103 (*) 70 - 99 (mg/dL)   BUN 36 (*) 6 - 23 (mg/dL)   Creatinine, Ser 0.86 (*) 0.50 - 1.35 (mg/dL)   Calcium 8.1 (*) 8.4 - 10.5 (mg/dL)   Phosphorus 2.7  2.3 - 4.6 (mg/dL)   Albumin 2.2 (*) 3.5 - 5.2 (g/dL)   GFR calc non Af Amer 26 (*) >90 (mL/min)   GFR calc Af Amer 30 (*) >90 (mL/min)  HEPATIC FUNCTION PANEL      Component Value Range   Total Protein 5.5 (*) 6.0 - 8.3 (g/dL)   Albumin 2.2 (*) 3.5 - 5.2 (g/dL)   AST 69 (*) 0 - 37 (U/L)   ALT <5  0 - 53 (U/L)   Alkaline Phosphatase 46  39 - 117 (U/L)   Total Bilirubin 0.5  0.3 - 1.2 (mg/dL)   Bilirubin, Direct 0.2  0.0 - 0.3 (mg/dL)   Indirect Bilirubin 0.3  0.3 - 0.9 (mg/dL)  HEMOGLOBIN AND HEMATOCRIT, BLOOD      Component Value Range   Hemoglobin 8.1 (*) 13.0 - 17.0 (g/dL)   HCT 57.8 (*) 46.9 - 52.0 (%)  GLUCOSE, CAPILLARY       Component Value Range   Glucose-Capillary 122 (*) 70 - 99 (mg/dL)   Comment 1 Documented in Chart     Comment 2 Notify RN    HEMOGLOBIN AND HEMATOCRIT, BLOOD      Component Value Range   Hemoglobin 9.4 (*) 13.0 - 17.0 (g/dL)   HCT 62.9 (*) 52.8 - 52.0 (%)  GLUCOSE, CAPILLARY      Component Value Range   Glucose-Capillary 113 (*) 70 - 99 (mg/dL)  PREPARE RBC (CROSSMATCH)      Component Value Range   Order Confirmation ORDER PROCESSED BY BLOOD BANK    TYPE AND SCREEN      Component Value Range   ABO/RH(D) O NEG  Antibody Screen NEG     Sample Expiration 05/18/2011     Unit Number [id number]     Blood Component Type RBC LR PHER1     Unit division 00     Status of Unit ISSUED,FINAL     Transfusion Status OK TO TRANSFUSE     Crossmatch Result Compatible     Unit Number [id number]     Blood Component Type RED CELLS,LR     Unit division 00     Status of Unit ISSUED,FINAL     Transfusion Status OK TO TRANSFUSE     Crossmatch Result Compatible    BLOOD GAS, ARTERIAL      Component Value Range   O2 Content 2.0     Delivery systems NASAL CANNULA     pH, Arterial 7.412  7.350 - 7.450    pCO2 arterial 40.0  35.0 - 45.0 (mmHg)   pO2, Arterial 65.6 (*) 80.0 - 100.0 (mmHg)   Bicarbonate 25.0 (*) 20.0 - 24.0 (mEq/L)   TCO2 22.8  0 - 100 (mmol/L)   Acid-Base Excess 0.9  0.0 - 2.0 (mmol/L)   O2 Saturation 93.4     Patient temperature 98.6     Collection site RIGHT RADIAL     Drawn by [id number]     Sample type ARTERIAL DRAW     Allens test (pass/fail) PASS  PASS   OCCULT BLOOD X 1 CARD TO LAB, STOOL      Component Value Range   Fecal Occult Bld NEGATIVE    GLUCOSE, CAPILLARY      Component Value Range   Glucose-Capillary 165 (*) 70 - 99 (mg/dL)  GLUCOSE, CAPILLARY      Component Value Range   Glucose-Capillary 146 (*) 70 - 99 (mg/dL)   Comment 1 Notify RN    PROTIME-INR      Component Value Range   Prothrombin Time 20.3 (*) 11.6 - 15.2 (seconds)   INR 1.70 (*) 0.00 - 1.49    VITAMIN B12      Component Value Range   Vitamin B-12 269  211 - 911 (pg/mL)  FOLATE      Component Value Range   Folate 14.8    IRON AND TIBC      Component Value Range   Iron 18 (*) 42 - 135 (ug/dL)   TIBC 213 (*) 086 - 578 (ug/dL)   Saturation Ratios 9 (*) 20 - 55 (%)   UIBC 186  125 - 400 (ug/dL)  FERRITIN      Component Value Range   Ferritin 361 (*) 22 - 322 (ng/mL)  RETICULOCYTES      Component Value Range   Retic Ct Pct 2.6  0.4 - 3.1 (%)   RBC. 3.16 (*) 4.22 - 5.81 (MIL/uL)   Retic Count, Manual 82.2  19.0 - 186.0 (K/uL)  CBC      Component Value Range   WBC 10.0  4.0 - 10.5 (K/uL)   RBC 3.16 (*) 4.22 - 5.81 (MIL/uL)   Hemoglobin 9.7 (*) 13.0 - 17.0 (g/dL)   HCT 46.9 (*) 62.9 - 52.0 (%)   MCV 87.3  78.0 - 100.0 (fL)   MCH 30.7  26.0 - 34.0 (pg)   MCHC 35.1  30.0 - 36.0 (g/dL)   RDW 52.8  41.3 - 24.4 (%)   Platelets 125 (*) 150 - 400 (K/uL)  RENAL FUNCTION PANEL      Component Value Range   Sodium 131 (*) 135 - 145 (mEq/L)  Potassium 3.8  3.5 - 5.1 (mEq/L)   Chloride 94 (*) 96 - 112 (mEq/L)   CO2 26  19 - 32 (mEq/L)   Glucose, Bld 135 (*) 70 - 99 (mg/dL)   BUN 44 (*) 6 - 23 (mg/dL)   Creatinine, Ser 1.61 (*) 0.50 - 1.35 (mg/dL)   Calcium 8.3 (*) 8.4 - 10.5 (mg/dL)   Phosphorus 3.4  2.3 - 4.6 (mg/dL)   Albumin 2.2 (*) 3.5 - 5.2 (g/dL)   GFR calc non Af Amer 27 (*) >90 (mL/min)   GFR calc Af Amer 32 (*) >90 (mL/min)  HEMOGLOBIN AND HEMATOCRIT, BLOOD      Component Value Range   Hemoglobin 9.4 (*) 13.0 - 17.0 (g/dL)   HCT 09.6 (*) 04.5 - 52.0 (%)  GLUCOSE, CAPILLARY      Component Value Range   Glucose-Capillary 156 (*) 70 - 99 (mg/dL)   Comment 1 Documented in Chart     Comment 2 Notify RN    GLUCOSE, CAPILLARY      Component Value Range   Glucose-Capillary 145 (*) 70 - 99 (mg/dL)   Comment 1 Documented in Chart     Comment 2 Notify RN    GLUCOSE, CAPILLARY      Component Value Range   Glucose-Capillary 140 (*) 70 - 99 (mg/dL)   Comment 1  Documented in Chart     Comment 2 Notify RN    GLUCOSE, CAPILLARY      Component Value Range   Glucose-Capillary 153 (*) 70 - 99 (mg/dL)  GLUCOSE, CAPILLARY      Component Value Range   Glucose-Capillary 140 (*) 70 - 99 (mg/dL)  GLUCOSE, CAPILLARY      Component Value Range   Glucose-Capillary 240 (*) 70 - 99 (mg/dL)   Comment 1 Notify RN    PROTIME-INR      Component Value Range   Prothrombin Time 22.1 (*) 11.6 - 15.2 (seconds)   INR 1.90 (*) 0.00 - 1.49   CBC      Component Value Range   WBC 9.4  4.0 - 10.5 (K/uL)   RBC 3.19 (*) 4.22 - 5.81 (MIL/uL)   Hemoglobin 9.7 (*) 13.0 - 17.0 (g/dL)   HCT 40.9 (*) 81.1 - 52.0 (%)   MCV 88.4  78.0 - 100.0 (fL)   MCH 30.4  26.0 - 34.0 (pg)   MCHC 34.4  30.0 - 36.0 (g/dL)   RDW 91.4  78.2 - 95.6 (%)   Platelets 137 (*) 150 - 400 (K/uL)  BASIC METABOLIC PANEL      Component Value Range   Sodium 131 (*) 135 - 145 (mEq/L)   Potassium 3.7  3.5 - 5.1 (mEq/L)   Chloride 95 (*) 96 - 112 (mEq/L)   CO2 25  19 - 32 (mEq/L)   Glucose, Bld 166 (*) 70 - 99 (mg/dL)   BUN 48 (*) 6 - 23 (mg/dL)   Creatinine, Ser 2.13 (*) 0.50 - 1.35 (mg/dL)   Calcium 8.0 (*) 8.4 - 10.5 (mg/dL)   GFR calc non Af Amer 32 (*) >90 (mL/min)   GFR calc Af Amer 37 (*) >90 (mL/min)  GLUCOSE, CAPILLARY      Component Value Range   Glucose-Capillary 164 (*) 70 - 99 (mg/dL)   Comment 1 Notify RN    GLUCOSE, CAPILLARY      Component Value Range   Glucose-Capillary 158 (*) 70 - 99 (mg/dL)  GLUCOSE, CAPILLARY      Component Value Range  Glucose-Capillary 164 (*) 70 - 99 (mg/dL)  PROTIME-INR      Component Value Range   Prothrombin Time 26.0 (*) 11.6 - 15.2 (seconds)   INR 2.34 (*) 0.00 - 1.49   RENAL FUNCTION PANEL      Component Value Range   Sodium 132 (*) 135 - 145 (mEq/L)   Potassium 3.0 (*) 3.5 - 5.1 (mEq/L)   Chloride 95 (*) 96 - 112 (mEq/L)   CO2 26  19 - 32 (mEq/L)   Glucose, Bld 184 (*) 70 - 99 (mg/dL)   BUN 44 (*) 6 - 23 (mg/dL)   Creatinine, Ser 1.61  (*) 0.50 - 1.35 (mg/dL)   Calcium 8.0 (*) 8.4 - 10.5 (mg/dL)   Phosphorus 2.9  2.3 - 4.6 (mg/dL)   Albumin 2.1 (*) 3.5 - 5.2 (g/dL)   GFR calc non Af Amer 36 (*) >90 (mL/min)   GFR calc Af Amer 42 (*) >90 (mL/min)  CBC      Component Value Range   WBC 9.4  4.0 - 10.5 (K/uL)   RBC 3.23 (*) 4.22 - 5.81 (MIL/uL)   Hemoglobin 9.7 (*) 13.0 - 17.0 (g/dL)   HCT 09.6 (*) 04.5 - 52.0 (%)   MCV 88.5  78.0 - 100.0 (fL)   MCH 30.0  26.0 - 34.0 (pg)   MCHC 33.9  30.0 - 36.0 (g/dL)   RDW 40.9  81.1 - 91.4 (%)   Platelets 150  150 - 400 (K/uL)  GLUCOSE, CAPILLARY      Component Value Range   Glucose-Capillary 182 (*) 70 - 99 (mg/dL)  GLUCOSE, CAPILLARY      Component Value Range   Glucose-Capillary 166 (*) 70 - 99 (mg/dL)  GLUCOSE, CAPILLARY      Component Value Range   Glucose-Capillary 172 (*) 70 - 99 (mg/dL)  GLUCOSE, CAPILLARY      Component Value Range   Glucose-Capillary 218 (*) 70 - 99 (mg/dL)  PROTIME-INR      Component Value Range   Prothrombin Time 28.7 (*) 11.6 - 15.2 (seconds)   INR 2.65 (*) 0.00 - 1.49   GLUCOSE, CAPILLARY      Component Value Range   Glucose-Capillary 219 (*) 70 - 99 (mg/dL)   Comment 1 Notify RN    GLUCOSE, CAPILLARY      Component Value Range   Glucose-Capillary 174 (*) 70 - 99 (mg/dL)  GLUCOSE, CAPILLARY      Component Value Range   Glucose-Capillary 168 (*) 70 - 99 (mg/dL)   Orders Only on 78/29/5621  Component Date Value Range Status  . Squamous Epithelial / LPF  05/03/2011 FEW* RARE Final  . WBC, UA (WBC/hpf) 05/03/2011 3-6  <3 Final  . Bacteria, UA  05/03/2011 FEW* RARE Final  . Urine-Other  05/03/2011 MUCOUS PRESENT   Final    X-Rays:Dg Chest 1 View  05/18/2011  *RADIOLOGY REPORT*  Clinical Data: 77 year old male with wheezing.  CHEST - 1 VIEW  Comparison: 05/15/2011  Findings: Cardiomegaly is again identified with decreasing pulmonary vascular congestion / mild edema. There is no evidence of focal airspace disease, pulmonary edema,  pulmonary nodule/mass, pleural effusion, or pneumothorax. No acute bony abnormalities are identified.  IMPRESSION: Cardiomegaly with decreased pulmonary vascular congestion / mild edema.  Original Report Authenticated By: Rosendo Gros, M.D.   Dg Chest 2 View  05/14/2011  *RADIOLOGY REPORT*  Clinical Data: Fever  CHEST - 2 VIEW  Comparison: 05/03/2011  Findings: Both views degraded by patient positioning.  2 lateral  views are essentially nondiagnostic.  The frontal view is degraded by AP portable positioning.  The patient is minimally rotated.  Mild cardiomegaly.  No definite pleural effusion. No pneumothorax. Low lung volumes with resultant pulmonary interstitial prominence. Limited evaluation of the left lung base secondary to overlying soft tissues.  IMPRESSION:  1. Mildly degraded exam (primarily lateral view), secondary to patient positioning. 2.  Cardiomegaly and low lung volumes, without definite acute process.  Original Report Authenticated By: Consuello Bossier, M.D.   Dg Chest 2 View  05/03/2011  *RADIOLOGY REPORT*  Clinical Data: Preoperative cardiopulmonary evaluation.  CHEST - 2 VIEW  Comparison: 04/23/2010.  Findings: The cardiac silhouette is normal size and shape. Ectasia and nonaneurysmal calcification of the thoracic aorta are seen. Mediastinal and hilar contours appear stable.  There is slight prominence of the reticular interstitial markings.  This is unchanged.  No acute infiltrative densities are seen.  There is flattening of the diaphragm on lateral image with overall hyperinflation configuration. No pleural abnormality is evident. Minimal degenerative disc disease and degenerative spondylosis changes are seen.  IMPRESSION: Overall hyperinflation configuration.  Chronic increase in prominence of reticular interstitial markings.  No consolidation, pleural effusion or other acute process.  Original Report Authenticated By: Crawford Givens, M.D.   Ap Pelvis Hip  05/11/2011  *RADIOLOGY REPORT*   Clinical Data: Postop ORIF right femur fracture in patient with right hip prosthesis.  PELVIS - 1-2 VIEW 05/11/2011:  Comparison: Portable right femur x-rays earlier same date.  Findings: Interval ORIF of the mid shaft femur fracture with cerclage wires.  Femur incompletely imaged, but anatomic alignment of the visualized femur in the AP projection .  Surgical drain in place.  IMPRESSION: ORIF of the femur fracture with cerclage wires.  Femur incompletely imaged, but anatomic alignment in the AP projection of the visualized femur.  Original Report Authenticated By: Arnell Sieving, M.D.   Dg Hip Complete Right  05/03/2011  *RADIOLOGY REPORT*  Clinical Data: Preop total hip replacement.  RIGHT HIP - COMPLETE 2+ VIEW  Comparison: None.  Findings: Bilateral total hip prosthesis are present, with the patient scheduled for removal on the right.  There is considerable loosening of the femoral component. Bones of the pelvis are grossly unremarkable.  IMPRESSION:  Preop right total hip replacement/revision as described.  Original Report Authenticated By: Elsie Stain, M.D.   Dg Femur Right  05/11/2011  *RADIOLOGY REPORT*  Clinical Data: Intraoperative right hip replacement  RIGHT FEMUR - 2 VIEW  Comparison: 05/03/2011  Findings: Interval revision of prior right total hip arthroplasty.  Fracture of the anterolateral mid right femoral shaft.  As a result, the distal femoral stem is eccentrically positioned in the femoral shaft.  Prior total knee arthroplasty.  IMPRESSION: Fracture of the anterolateral mid right femoral shaft.  Revision of right total hip arthroplasty.  Distal femoral stem is eccentrically positioned.  Original Report Authenticated By: Charline Bills, M.D.   US Renal Port  05/15/2011  *RADIOLOGY REPORT*  Clinical Data: Acute renal failure.  RENAL/URINARY TRACT ULTRASOUND COMPLETE  Comparison:  None.  Findings:  Right Kidney:  10.9 cm.  Increased cortical echotexture compatible with medical  renal disease.  No hydronephrosis.  No obstruction.  Left Kidney:  10.4 cm.  Increased renal cortical echotexture compatible with medical renal disease.  No hydronephrosis or obstruction.  Bladder:  Decompressed with urinary bladder.  Obese body habitus degrades the study.  IMPRESSION: Echogenic renal cortex bilaterally without obstruction compatible with medical renal disease.  Original  Report Authenticated By: Andreas Newport, M.D.   Dg Chest Port 1 View  05/15/2011  *RADIOLOGY REPORT*  Clinical Data: Hypoxemia.  PORTABLE CHEST - 1 VIEW  Comparison: 05/14/2011.  Findings: Mild pulmonary edema.  No segmental consolidation. Cardiomegaly.  No gross pneumothorax. Mildly tortuous aorta. Calcified aortic knob.  IMPRESSION: Mild pulmonary edema.  Cardiomegaly.  Original Report Authenticated By: Fuller Canada, M.D.    EKG: Orders placed in visit on 05/03/11  . EKG 12-LEAD  . EKG 12-LEAD     Hospital Course: Patient originally setup to go yesterday to [facility] but developed cough and questionable blood tinged sputum.  Seen by hospitalists.  Chart reviewed in detail-Patient initially consulted upon by Orthopedics D#3 s/p R TH replacement (now D#7), as pt became Hyponatremic with Vol overload, creat peak at 1.94, was non-oliguric and thought to have sustained AKI from multiple causes (abla/ARB/biguanide)-recieved PRBC x 2 and had low grade fevers as well-which have since resolved  Noted today by Rn to have had some cough with greenish tinged sputum with possible blood streaks.  Poor PO intake today, and has been feeling "bad" per RN-had loose stools over the past 2 days, but none recently.  Has had some stomach aching as well, but then had "dry Heaves" and a quarter sized spots of what might have been mucous/blood  Highly doubt he has hematemesis-he is on chronic nasal canula oxygen without humdity-he does have a wheeze, but is on PRN nebs, which I have changed to scheduled Combivent q 6 for the short  term Has never used Oxygen as an outpatient, so unclear that he is O2 dependant Needs Combivent on d/c to SNF. Continue Augmentin--would d/c after 10-14 days full therapy .   Discharge Medications: Prior to Admission medications   Medication Sig Start Date End Date Taking? Authorizing Provider  carbidopa-levodopa-entacapone (STALEVO) 25-100-200 MG per tablet Take 1 tablet by mouth 3 (three) times daily.    Yes Historical Provider, MD  donepezil (ARICEPT) 5 MG tablet Take 5 mg by mouth at bedtime.    Yes Historical Provider, MD  furosemide (LASIX) 40 MG tablet Take 40 mg by mouth every morning.    Yes Historical Provider, MD  glimepiride (AMARYL) 2 MG tablet Take 2 mg by mouth daily. Pt takes at 2 pm    Yes Historical Provider, MD  guaiFENesin-dextromethorphan (ROBITUSSIN DM) 100-10 MG/5ML syrup Take 5 mLs by mouth 3 (three) times daily as needed. cough   Yes Historical Provider, MD  ketoconazole (NIZORAL) 2 % cream Apply 1 application topically daily as needed. itching   Yes Historical Provider, MD  loratadine (CLARITIN) 10 MG tablet Take 10 mg by mouth every morning.    Yes Historical Provider, MD  mometasone (NASONEX) 50 MCG/ACT nasal spray Place 2 sprays into the nose daily as needed. Allergies     Yes Historical Provider, MD  niacin 500 MG tablet Take 500 mg by mouth 2 (two) times daily with a meal.     Yes Historical Provider, MD  olmesartan (BENICAR) 20 MG tablet Take 20 mg by mouth every morning.    Yes Historical Provider, MD  Olopatadine HCl (PATADAY) 0.2 % SOLN Place 1 drop into both eyes daily as needed. Dry eye   Yes Historical Provider, MD  OVER THE COUNTER MEDICATION Take 1 tablet by mouth 3 (three) times daily. Lithium Aspartate 5 mg    Yes Historical Provider, MD  potassium chloride SA (K-DUR,KLOR-CON) 20 MEQ tablet Take 20 mEq by mouth every morning.  Yes Historical Provider, MD  simvastatin (ZOCOR) 40 MG tablet Take 40 mg by mouth at bedtime.    Yes Historical Provider, MD   temazepam (RESTORIL) 30 MG capsule Take 30 mg by mouth at bedtime.    Yes Historical Provider, MD  traMADol (ULTRAM) 50 MG tablet Take 50 mg by mouth every 8 (eight) hours as needed. Pain     Yes Historical Provider, MD  triamterene-hydrochlorothiazide (MAXZIDE) 75-50 MG per tablet Take 1 tablet by mouth every morning.    Yes Historical Provider, MD  acetaminophen (TYLENOL) 325 MG tablet Take 2 tablets (650 mg total) by mouth every 6 (six) hours as needed (or Fever >/= 101). 05/18/11 05/28/11  Alexzandrew Perkins, PA  amoxicillin-clavulanate (AUGMENTIN) 875-125 MG per tablet Take 1 tablet by mouth every 12 (twelve) hours. 05/18/11 05/30/2011  Alexzandrew Perkins, PA  ipratropium-albuterol (DUONEB) 0.5-2.5 (3) MG/3ML SOLN Take 3 mLs by nebulization every 4 (four) hours as needed. wheezing    Historical Provider, MD  magnesium hydroxide (MILK OF MAGNESIA) 400 MG/5ML suspension Take 30 mLs by mouth every 12 (twelve) hours as needed for constipation. 05/18/11 05/28/11  Alexzandrew Perkins, PA  multivitamin-lutein (OCUVITE-LUTEIN) CAPS Take 1 capsule by mouth daily.      Historical Provider, MD  ondansetron (ZOFRAN) 4 MG tablet Take 1 tablet (4 mg total) by mouth every 6 (six) hours as needed for nausea. 05/18/11 05/25/11  Alexzandrew Perkins, PA  polyethylene glycol (MIRALAX / GLYCOLAX) packet Take 17 g by mouth 2 (two) times daily. 05/18/11 05/21/11  Alexzandrew Julien Girt, PA   albuterol-ipratropium (COMBIVENT) inhaler 2 puff  Freq: Every 6 hours   Diet: carb modified - medium  Activity: NWB / TDWB No bending hip over 90 degrees- A "L" Angle Do not cross legs Do not let foot roll inward  When turning these patients a pillow should be placed between the patient's legs to prevent crossing.  Patients should have the affected knee fully extended when trying to sit or stand from all surfaces to prevent excessive hip flexion.  When ambulating and turning toward the affected side the affected leg should  have the toes turned out prior to moving the walker and the rest of patient's body as to prevent internal rotation/ turning in of the leg.  Abduction pillows are the most effective way to prevent a patient from not crossing legs or turning toes in at rest. If an abduction pillow is not ordered placing a regular pillow length wise between the patient's legs is also an effective reminder.  It is imperative that these precautions be maintained so that the surgical hip does not dislocate.    Follow-up: next week in office  Disposition: [facility]  Discharged Condition: stable   Discharge Orders    Future Orders Please Complete By Expires   Diet - low sodium heart healthy      Call MD / Call 911      Comments:   If you experience chest pain or shortness of breath, CALL 911 and be transported to the hospital emergency room.  If you develope a fever above 101 F, pus (white drainage) or increased drainage or redness at the wound, or calf pain, call your surgeon's office.   Constipation Prevention      Comments:   Drink plenty of fluids.  Prune juice may be helpful.  You may use a stool softener, such as Colace (over the counter) 100 mg twice a day.  Use MiraLax (over the counter) for constipation as needed.  Increase activity slowly as tolerated      Weight Bearing as taught in Physical Therapy      Comments:   Use a walker or crutches as instructed.   Discharge instructions      Comments:   Pick up stool softner and laxative for home. Do not submerge incision under water. May shower. Hip precautions.  Total Hip Protocol. Touch Down Weight Bearing ONLY - Do Not Advance WB Status   Driving restrictions      Comments:   No driving   Lifting restrictions      Comments:   No lifting   Change dressing      Comments:   [facility] staff please change the dressing daily with sterile 4 x 4 inch gauze dressing and paper tape.   TED hose      Comments:   Use stockings (TED hose) for 3  weeks on both leg(s).  Staff may remove them at night for sleeping.     Current Discharge Medication List    START taking these medications   Details  acetaminophen (TYLENOL) 325 MG tablet Take 2 tablets (650 mg total) by mouth every 6 (six) hours as needed (or Fever >/= 101). Qty: 30 tablet, Refills: 0    amoxicillin-clavulanate (AUGMENTIN) 875-125 MG per tablet Take 1 tablet by mouth every 12 (twelve) hours. Qty: 14 tablet, Refills: 0    magnesium hydroxide (MILK OF MAGNESIA) 400 MG/5ML suspension Take 30 mLs by mouth every 12 (twelve) hours as needed for constipation. Qty: 360 mL, Refills: 0    ondansetron (ZOFRAN) 4 MG tablet Take 1 tablet (4 mg total) by mouth every 6 (six) hours as needed for nausea. Qty: 20 tablet, Refills: 0    polyethylene glycol (MIRALAX / GLYCOLAX) packet Take 17 g by mouth 2 (two) times daily. Qty: 14 each, Refills: 0      CONTINUE these medications which have NOT CHANGED   Details  carbidopa-levodopa-entacapone (STALEVO) 25-100-200 MG per tablet Take 1 tablet by mouth 3 (three) times daily.     donepezil (ARICEPT) 5 MG tablet Take 5 mg by mouth at bedtime.     furosemide (LASIX) 40 MG tablet Take 40 mg by mouth every morning.     glimepiride (AMARYL) 2 MG tablet Take 2 mg by mouth daily. Pt takes at 2 pm     guaiFENesin-dextromethorphan (ROBITUSSIN DM) 100-10 MG/5ML syrup Take 5 mLs by mouth 3 (three) times daily as needed. cough    ketoconazole (NIZORAL) 2 % cream Apply 1 application topically daily as needed. itching    loratadine (CLARITIN) 10 MG tablet Take 10 mg by mouth every morning.     mometasone (NASONEX) 50 MCG/ACT nasal spray Place 2 sprays into the nose daily as needed. Allergies      niacin 500 MG tablet Take 500 mg by mouth 2 (two) times daily with a meal.      olmesartan (BENICAR) 20 MG tablet Take 20 mg by mouth every morning.     Olopatadine HCl (PATADAY) 0.2 % SOLN Place 1 drop into both eyes daily as needed. Dry eye      OVER THE COUNTER MEDICATION Take 1 tablet by mouth 3 (three) times daily. Lithium Aspartate 5 mg     potassium chloride SA (K-DUR,KLOR-CON) 20 MEQ tablet Take 20 mEq by mouth every morning.     simvastatin (ZOCOR) 40 MG tablet Take 40 mg by mouth at bedtime.     temazepam (RESTORIL) 30 MG capsule Take 30  mg by mouth at bedtime.     traMADol (ULTRAM) 50 MG tablet Take 50 mg by mouth every 8 (eight) hours as needed. Pain      triamterene-hydrochlorothiazide (MAXZIDE) 75-50 MG per tablet Take 1 tablet by mouth every morning.     ipratropium-albuterol (DUONEB) 0.5-2.5 (3) MG/3ML SOLN Take 3 mLs by nebulization every 4 (four) hours as needed. wheezing    multivitamin-lutein (OCUVITE-LUTEIN) CAPS Take 1 capsule by mouth daily.        STOP taking these medications     cholecalciferol (VITAMIN D) 1000 UNITS tablet      HYDROcodone-acetaminophen (NORCO) 5-325 MG per tablet      metFORMIN (GLUCOPHAGE) 500 MG tablet      PRESCRIPTION MEDICATION      aspirin EC 81 MG tablet      Multiple Vitamins-Minerals (MULTIVITAMINS THER. W/MINERALS) TABS      Omega-3 Fatty Acids (FISH OIL) 1200 MG CAPS      vitamin C (ASCORBIC ACID) 500 MG tablet        Follow-up Information    Follow up with ALUISIO,FRANK V. Make an appointment in 1 week. [facility] staff needs to call office to make appt. and transportation arrangements for patient.)    Contact information:   [facility] [address] [place] [address] [phone]          Signed: Patrica Duel 05/19/2011, 9:04 AM

## 2011-05-20 ENCOUNTER — Encounter (HOSPITAL_COMMUNITY): Payer: Self-pay | Admitting: Orthopedic Surgery

## 2011-05-20 LAB — CULTURE, BLOOD (ROUTINE X 2): Culture: NO GROWTH

## 2011-09-07 ENCOUNTER — Telehealth: Payer: Self-pay | Admitting: *Deleted

## 2011-09-07 ENCOUNTER — Ambulatory Visit (INDEPENDENT_AMBULATORY_CARE_PROVIDER_SITE_OTHER): Payer: Medicare Other

## 2011-09-07 DIAGNOSIS — J309 Allergic rhinitis, unspecified: Secondary | ICD-10-CM

## 2011-09-07 NOTE — Telephone Encounter (Signed)
Dr.Young said it was okay to send pt.vac.. It will be in tomorrow's mail.

## 2011-09-07 NOTE — Telephone Encounter (Signed)
Called pt. Spoke with his wife.Preston Carter had hip surgery;it was split,recovery has not been so good. He's still not able to get in and out of the car. The ?is can we send his vac.this time and he will make an appt.to come and see you before it runs out.(per spouse;he on 1:10 at 0.5) His lov was 04/23/10. Please advise.

## 2012-01-13 ENCOUNTER — Ambulatory Visit (INDEPENDENT_AMBULATORY_CARE_PROVIDER_SITE_OTHER): Payer: Medicare Other | Admitting: Internal Medicine

## 2012-01-13 ENCOUNTER — Encounter: Payer: Self-pay | Admitting: Internal Medicine

## 2012-01-13 VITALS — BP 122/66 | HR 63 | Ht 66.0 in | Wt 301.0 lb

## 2012-01-13 DIAGNOSIS — J302 Other seasonal allergic rhinitis: Secondary | ICD-10-CM

## 2012-01-13 DIAGNOSIS — J309 Allergic rhinitis, unspecified: Secondary | ICD-10-CM

## 2012-01-13 DIAGNOSIS — J45909 Unspecified asthma, uncomplicated: Secondary | ICD-10-CM

## 2012-01-13 DIAGNOSIS — G4733 Obstructive sleep apnea (adult) (pediatric): Secondary | ICD-10-CM

## 2012-01-13 DIAGNOSIS — J45998 Other asthma: Secondary | ICD-10-CM

## 2012-01-13 NOTE — Progress Notes (Signed)
01/13/12- 80 yoM former smoker followed for OSA/ sp UPPP, allergic rhinitis, asthma LOV   04/23/2010         Wife here  Patient does not use a cpap machine anymore. States breathing is good  has trouble falling asleep ant night . States once he is asleep he sleeps ok without waking up.  He missed his last appointment was of hip replacement with no respiratory complications. He says he couldn't keep CPAP on during sleep so he finally gave it back. He and his wife say he is better since his UPPP surgery and she says she only snores "a little bit". Benadryl sometimes helps sleep consolidation. He continues allergy vaccine. Rarely needs an antihistamine. Has a home nebulizer which is rarely used and he  does not use rescue inhaler anymore at all.  ROS-see HPI Constitutional:   No-   weight loss, night sweats, fevers, chills, fatigue, lassitude. HEENT:   No-  headaches, difficulty swallowing, tooth/dental problems, sore throat,       No-  sneezing, itching, ear ache, nasal congestion, post nasal drip,  CV:  No-   chest pain, orthopnea, PND, swelling in lower extremities, anasarca,  dizziness, palpitations Resp: No-acute   shortness of breath with exertion or at rest.              No-   productive cough,  No non-productive cough,  No- coughing up of blood.              No-   change in color of mucus.  No- wheezing.   Skin: No-   rash or lesions. GI:  No-   heartburn, indigestion, abdominal pain, nausea, vomiting,  GU: . MS:  No-   joint pain or swelling.   Neuro-     nothing unusual Psych:  No- change in mood or affect. No depression or anxiety.  No memory loss.  OBJ- Physical Exam General- Alert, Oriented, Affect-appropriate, Distress- none acute, obese Skin- rash-none, lesions- none, excoriation- none Lymphadenopathy- none Head- atraumatic            Eyes- Gross vision intact, PERRLA, conjunctivae and secretions clear            Ears- Hearing, canals-normal            Nose- Clear, no-Septal  dev, +mucus bridging, polyps, erosion, perforation             Throat- Mallampati II , mucosa clear , drainage- none, tonsils- atrophic Neck- flexible , trachea midline, no stridor , thyroid nl, carotid no bruit Chest - symmetrical excursion , unlabored           Heart/CV- RRR , no murmur , no gallop  , no rub, nl s1 s2                           - JVD- none , edema- none, stasis changes- none, varices- none           Lung- clear to P&A, wheeze- none, cough- none , dullness-none, rub- none           Chest wall-  Abd-  Br/ Gen/ Rectal- Not done, not indicated Extrem- cyanosis- none, clubbing, none, atrophy- none, strength- nl. Using a walker Neuro- head bob tremor

## 2012-01-13 NOTE — Patient Instructions (Addendum)
For itching, try otc antihistamine loratadine/ Claritin  We will continue your allergy vaccine.

## 2012-01-19 NOTE — Assessment & Plan Note (Signed)
Weight loss would help. He and his wife both describe adequate control after UPPP surgery.

## 2012-01-19 NOTE — Assessment & Plan Note (Signed)
Well controlled with rare need for rescue medication

## 2012-01-19 NOTE — Assessment & Plan Note (Signed)
Risk, benefit and epinephrine discussed

## 2012-02-16 ENCOUNTER — Other Ambulatory Visit: Payer: Self-pay | Admitting: Physician Assistant

## 2012-03-01 ENCOUNTER — Ambulatory Visit (INDEPENDENT_AMBULATORY_CARE_PROVIDER_SITE_OTHER): Payer: Medicare Other

## 2012-03-01 DIAGNOSIS — J309 Allergic rhinitis, unspecified: Secondary | ICD-10-CM

## 2012-03-22 ENCOUNTER — Encounter (HOSPITAL_BASED_OUTPATIENT_CLINIC_OR_DEPARTMENT_OTHER): Payer: Medicare Other | Attending: Internal Medicine

## 2012-03-22 DIAGNOSIS — Z79899 Other long term (current) drug therapy: Secondary | ICD-10-CM | POA: Insufficient documentation

## 2012-03-22 DIAGNOSIS — L97809 Non-pressure chronic ulcer of other part of unspecified lower leg with unspecified severity: Secondary | ICD-10-CM | POA: Insufficient documentation

## 2012-03-22 DIAGNOSIS — E1149 Type 2 diabetes mellitus with other diabetic neurological complication: Secondary | ICD-10-CM | POA: Insufficient documentation

## 2012-03-22 DIAGNOSIS — E1142 Type 2 diabetes mellitus with diabetic polyneuropathy: Secondary | ICD-10-CM | POA: Insufficient documentation

## 2012-03-22 DIAGNOSIS — I10 Essential (primary) hypertension: Secondary | ICD-10-CM | POA: Insufficient documentation

## 2012-03-22 NOTE — Progress Notes (Signed)
[facility]  NAME:  Preston Carter, Preston Carter NO.:  [id number]  MEDICAL RECORD NO.:  [id number]      DATE OF BIRTH:  22-Feb-1932  PHYSICIAN:  Maxwell Caul, M.D.      VISIT DATE:                                  OFFICE VISIT   CHIEF COMPLAINT:  Referred for wound on left lower leg.  [name] is a 78 year old man who arrived today accompanied by his wife.  Kindly referred by Dr. Ivery Quale of [facility].  The history is that the wound on the left lateral leg has been present for probably 2 or 3 months.  We have a review by Dr. Jarold Motto, which I believe was actually performed on 08/22, although there is a date yesterday.  The patient tells me that he was not seen yesterday.  At that point, the patient had an ulcer on his lateral left leg, which was treated with a topical dressing as well as doxycycline. Apparently according to his wife, the erythema and drainage improved; however, the wound has never really healed over.  He likely has chronic edema, which is multifactorial, probably some degree of venous stasis. However, there is no prior wound history.  PAST MEDICAL HISTORY:  Hypertension, obesity, lumbar spinal osteoarthritis, hyperlipidemia, cerebrovascular disease, asthma gastroesophageal reflux disease, obstructive sleep apnea, intolerant of CPAP, Parkinson's disease.  SURGICAL HISTORY: 1. A right total knee replacement. 2. A left tolerated knee replacement in 2000. 3. Somnoplasty in 1998. 4. Right great toenail excision in 2005. 5. Lap chole in 2007. 6. Right hip replacement with femur fracture.  Medication list is reviewed and includes: 1. Temazepam 30 daily. 2. Triamterene/hydrochlorothiazide 75/50 daily. 3. Lasix 40 daily. 4. Stalevo 100 three times a day. 5. Benicar 20 daily. 6. Aricept 5 daily. 7. Metformin 500 daily. 8. Niacin 500 two times a day. 9. ASA 80 one daily. 10.Simvastatin 40  daily. 11.KCl 20 daily. 12.Nasonex to each nostril daily. 13.Albuterol p.r.n. 14.Atrovent p.r.n. 15.Tramadol p.r.n. 16.Glimepiride 1 mg daily.  PHYSICAL EXAMINATION:  This is an obese man weighing 300 pounds, height was 5 feet 8 inches.  Temperature 98.3, pulse 57, respirations 20, blood pressure is 159/64.  Blood glucose was 130.  The area in question is on his left lateral leg, measures 3.3 x 2.1 x 0.1.  Actually, the granulation here looks fairly healthy.  However, in both legs, the edema control was less than adequate.  ABIs measured in this clinic were 0.76 on the left and 0.71 on the right.  There is probably significant PAD.  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  We have sent him for full arterial evaluation of the left leg.  Nothing needed culturing.  No antibiotics were ordered.  The leg was dressed with silver alginate, TCA, and zinc widely to the normal skin.  We wrapped his main Kerlix, Coban bilaterally, as there was a small threatened area on the right without a clear all ulcer.  We will look at the arterial studies before considering more aggressive compression.          ______________________________ Maxwell Caul, M.D.     MGR/MEDQ  D:  03/22/2012  T:  03/22/2012  Job:  [id number]

## 2012-04-26 ENCOUNTER — Encounter (HOSPITAL_BASED_OUTPATIENT_CLINIC_OR_DEPARTMENT_OTHER): Payer: Medicare Other

## 2012-07-04 ENCOUNTER — Ambulatory Visit (INDEPENDENT_AMBULATORY_CARE_PROVIDER_SITE_OTHER): Payer: Medicare Other

## 2012-07-04 DIAGNOSIS — J309 Allergic rhinitis, unspecified: Secondary | ICD-10-CM

## 2012-07-25 ENCOUNTER — Encounter (HOSPITAL_BASED_OUTPATIENT_CLINIC_OR_DEPARTMENT_OTHER): Payer: Medicare Other | Attending: General Surgery

## 2012-07-25 DIAGNOSIS — I509 Heart failure, unspecified: Secondary | ICD-10-CM | POA: Insufficient documentation

## 2012-07-25 DIAGNOSIS — L03119 Cellulitis of unspecified part of limb: Secondary | ICD-10-CM | POA: Insufficient documentation

## 2012-07-25 DIAGNOSIS — L02419 Cutaneous abscess of limb, unspecified: Secondary | ICD-10-CM | POA: Insufficient documentation

## 2012-07-25 DIAGNOSIS — Z7982 Long term (current) use of aspirin: Secondary | ICD-10-CM | POA: Insufficient documentation

## 2012-07-25 DIAGNOSIS — I10 Essential (primary) hypertension: Secondary | ICD-10-CM | POA: Insufficient documentation

## 2012-07-25 DIAGNOSIS — E1169 Type 2 diabetes mellitus with other specified complication: Secondary | ICD-10-CM | POA: Insufficient documentation

## 2012-07-25 DIAGNOSIS — Z79899 Other long term (current) drug therapy: Secondary | ICD-10-CM | POA: Insufficient documentation

## 2012-07-25 DIAGNOSIS — L97809 Non-pressure chronic ulcer of other part of unspecified lower leg with unspecified severity: Secondary | ICD-10-CM | POA: Insufficient documentation

## 2012-07-26 NOTE — Progress Notes (Signed)
[facility]  NAME:  BRAYTON, BAUMGARTNER NO.:  [id number]  MEDICAL RECORD NO.:  [id number]      DATE OF BIRTH:  April 18, 1932  PHYSICIAN:  Ardath Sax, M.D.           VISIT DATE:                                  OFFICE VISIT   Preston Carter is an old patient that returns with diabetic ulcers on his legs.  These are classified as a Wagner 2 and are on the anterior aspect of both of his legs.  They have a surrounding cellulitis on both of them.  He has been treated for these in the past.  This man has type 2 diabetes.  He has hypertension.  He is morbidly obese.  He weighs over 300 pounds, and we did clear him up in the past with treatments here in the clinic.  All of his diagnoses are diabetes, obesity, hypertension, cerebrovascular disease, morbid obesity, congestive heart failure.  His medications include Lasix, Stalevo, Benicar, Aricept, metformin, niacin, aspirin, simvastatin, potassium chloride, albuterol p.r.n., tramadol p.r.n., and glimepiride.  Today, on examination, he has 2 ulcers anteriorly, Wagner 2 about a cm and a half to 2 cm in diameter with surrounding cellulitis.  We are treating him with silver alginate and we will have him come back in a week.  We also wrapped him lightly in Coban Lite.     Ardath Sax, M.D.     PP/MEDQ  D:  07/25/2012  T:  07/26/2012  Job:  [id number]

## 2012-08-22 ENCOUNTER — Encounter (HOSPITAL_BASED_OUTPATIENT_CLINIC_OR_DEPARTMENT_OTHER): Payer: Medicare Other | Attending: General Surgery

## 2012-08-22 DIAGNOSIS — L97809 Non-pressure chronic ulcer of other part of unspecified lower leg with unspecified severity: Secondary | ICD-10-CM | POA: Insufficient documentation

## 2012-08-22 DIAGNOSIS — I87309 Chronic venous hypertension (idiopathic) without complications of unspecified lower extremity: Secondary | ICD-10-CM | POA: Insufficient documentation

## 2012-12-04 ENCOUNTER — Encounter (HOSPITAL_COMMUNITY): Payer: Self-pay

## 2012-12-04 ENCOUNTER — Inpatient Hospital Stay (HOSPITAL_COMMUNITY)
Admission: EM | Admit: 2012-12-04 | Discharge: 2012-12-07 | DRG: 261 | Disposition: A | Discharge status: Home / Self Care | Payer: Medicare Other | Attending: Cardiology | Admitting: Cardiology

## 2012-12-04 ENCOUNTER — Emergency Department (HOSPITAL_COMMUNITY): Payer: Medicare Other

## 2012-12-04 DIAGNOSIS — E662 Morbid (severe) obesity with alveolar hypoventilation: Secondary | ICD-10-CM | POA: Diagnosis present

## 2012-12-04 DIAGNOSIS — D649 Anemia, unspecified: Secondary | ICD-10-CM | POA: Diagnosis present

## 2012-12-04 DIAGNOSIS — Z6841 Body Mass Index (BMI) 40.0 and over, adult: Secondary | ICD-10-CM

## 2012-12-04 DIAGNOSIS — I129 Hypertensive chronic kidney disease with stage 1 through stage 4 chronic kidney disease, or unspecified chronic kidney disease: Secondary | ICD-10-CM | POA: Diagnosis present

## 2012-12-04 DIAGNOSIS — G3183 Dementia with Lewy bodies: Secondary | ICD-10-CM | POA: Diagnosis present

## 2012-12-04 DIAGNOSIS — Z87891 Personal history of nicotine dependence: Secondary | ICD-10-CM

## 2012-12-04 DIAGNOSIS — N184 Chronic kidney disease, stage 4 (severe): Secondary | ICD-10-CM | POA: Diagnosis present

## 2012-12-04 DIAGNOSIS — I495 Sick sinus syndrome: Principal | ICD-10-CM | POA: Diagnosis present

## 2012-12-04 DIAGNOSIS — E119 Type 2 diabetes mellitus without complications: Secondary | ICD-10-CM | POA: Diagnosis present

## 2012-12-04 DIAGNOSIS — Z8673 Personal history of transient ischemic attack (TIA), and cerebral infarction without residual deficits: Secondary | ICD-10-CM

## 2012-12-04 DIAGNOSIS — Z96649 Presence of unspecified artificial hip joint: Secondary | ICD-10-CM

## 2012-12-04 DIAGNOSIS — F0280 Dementia in other diseases classified elsewhere without behavioral disturbance: Secondary | ICD-10-CM | POA: Diagnosis present

## 2012-12-04 DIAGNOSIS — R609 Edema, unspecified: Secondary | ICD-10-CM | POA: Diagnosis present

## 2012-12-04 DIAGNOSIS — Z79899 Other long term (current) drug therapy: Secondary | ICD-10-CM

## 2012-12-04 DIAGNOSIS — R55 Syncope and collapse: Secondary | ICD-10-CM

## 2012-12-04 DIAGNOSIS — Z96659 Presence of unspecified artificial knee joint: Secondary | ICD-10-CM

## 2012-12-04 DIAGNOSIS — G4733 Obstructive sleep apnea (adult) (pediatric): Secondary | ICD-10-CM | POA: Diagnosis present

## 2012-12-04 DIAGNOSIS — W19XXXA Unspecified fall, initial encounter: Secondary | ICD-10-CM

## 2012-12-04 HISTORY — DX: Cardiac arrhythmia, unspecified: I49.9

## 2012-12-04 HISTORY — DX: Anemia, unspecified: D64.9

## 2012-12-04 LAB — CBC WITH DIFFERENTIAL/PLATELET
Basophils Absolute: 0 10*3/uL (ref 0.0–0.1)
Basophils Relative: 0 % (ref 0–1)
Eosinophils Relative: 6 % — ABNORMAL HIGH (ref 0–5)
HCT: 32.9 % — ABNORMAL LOW (ref 39.0–52.0)
HCT: 30.6 % — ABNORMAL LOW (ref 39.0–52.0)
Hemoglobin: 10.7 g/dL — ABNORMAL LOW (ref 13.0–17.0)
Lymphocytes Relative: 18 % (ref 12–46)
Lymphs Abs: 1.3 10*3/uL (ref 0.7–4.0)
MCH: 31.8 pg (ref 26.0–34.0)
MCHC: 35 g/dL (ref 30.0–36.0)
MCHC: 35 g/dL (ref 30.0–36.0)
MCV: 91.4 fL (ref 78.0–100.0)
Monocytes Absolute: 0.8 10*3/uL (ref 0.1–1.0)
Monocytes Absolute: 0.6 10*3/uL (ref 0.1–1.0)
Monocytes Relative: 7 % (ref 3–12)
Neutro Abs: 6.2 10*3/uL (ref 1.7–7.7)
Neutrophils Relative %: 74 % (ref 43–77)
RBC: 3.37 MIL/uL — ABNORMAL LOW (ref 4.22–5.81)
RDW: 14.5 % (ref 11.5–15.5)
WBC: 10.3 10*3/uL (ref 4.0–10.5)

## 2012-12-04 LAB — BASIC METABOLIC PANEL
CO2: 25 mEq/L (ref 19–32)
Calcium: 9.2 mg/dL (ref 8.4–10.5)
Creatinine, Ser: 1.93 mg/dL — ABNORMAL HIGH (ref 0.50–1.35)

## 2012-12-04 LAB — POCT I-STAT, CHEM 8
Calcium, Ion: 1.04 mmol/L — ABNORMAL LOW (ref 1.13–1.30)
Creatinine, Ser: 2.2 mg/dL — ABNORMAL HIGH (ref 0.50–1.35)
Glucose, Bld: 146 mg/dL — ABNORMAL HIGH (ref 70–99)
Hemoglobin: 11.2 g/dL — ABNORMAL LOW (ref 13.0–17.0)
Sodium: 134 mEq/L — ABNORMAL LOW (ref 135–145)
TCO2: 27 mmol/L (ref 0–100)

## 2012-12-04 LAB — PROTIME-INR: INR: 0.99 (ref 0.00–1.49)

## 2012-12-04 LAB — APTT: aPTT: 32 seconds (ref 24–37)

## 2012-12-04 LAB — COMPREHENSIVE METABOLIC PANEL
ALT: 6 U/L (ref 0–53)
Alkaline Phosphatase: 45 U/L (ref 39–117)
BUN: 61 mg/dL — ABNORMAL HIGH (ref 6–23)
CO2: 27 mEq/L (ref 19–32)
GFR calc Af Amer: 38 mL/min — ABNORMAL LOW (ref >90)
GFR calc non Af Amer: 33 mL/min — ABNORMAL LOW (ref >90)
Glucose, Bld: 185 mg/dL — ABNORMAL HIGH (ref 70–99)
Potassium: 4.4 mEq/L (ref 3.5–5.1)
Sodium: 133 mEq/L — ABNORMAL LOW (ref 135–145)
Total Bilirubin: 0.4 mg/dL (ref 0.3–1.2)

## 2012-12-04 LAB — TROPONIN I: Troponin I: <0.3 ng/mL (ref <0.30)

## 2012-12-04 LAB — POCT I-STAT TROPONIN I

## 2012-12-04 MED ORDER — HEPARIN SODIUM (PORCINE) 5000 UNIT/ML IJ SOLN
5000.0000 [IU] | Freq: Three times a day (TID) | INTRAMUSCULAR | Status: DC
  Administered 2012-12-04 – 2012-12-05 (×2): 5000 [IU] via SUBCUTANEOUS
  Filled 2012-12-04 (×5): qty 1

## 2012-12-04 MED ORDER — NITROGLYCERIN 0.4 MG SL SUBL: 0.4000 mg | SUBLINGUAL_TABLET | SUBLINGUAL | Status: DC | PRN

## 2012-12-04 MED ORDER — ASPIRIN 81 MG PO CHEW
324.0000 mg | CHEWABLE_TABLET | ORAL | Status: AC
  Administered 2012-12-04: 324 mg via ORAL
  Filled 2012-12-04: qty 4

## 2012-12-04 MED ORDER — PANTOPRAZOLE SODIUM 40 MG PO TBEC
40.0000 mg | DELAYED_RELEASE_TABLET | Freq: Every day | ORAL | Status: DC
  Administered 2012-12-04 – 2012-12-07 (×3): 40 mg via ORAL
  Filled 2012-12-04 (×3): qty 1

## 2012-12-04 MED ORDER — ENTACAPONE 200 MG PO TABS
200.0000 mg | ORAL_TABLET | Freq: Three times a day (TID) | ORAL | Status: DC
  Administered 2012-12-04 – 2012-12-07 (×8): 200 mg via ORAL
  Filled 2012-12-04 (×11): qty 1

## 2012-12-04 MED ORDER — KETOCONAZOLE 2 % EX CREA
1.0000 "application " | TOPICAL_CREAM | Freq: Every day | CUTANEOUS | Status: DC | PRN
  Filled 2012-12-04: qty 15

## 2012-12-04 MED ORDER — POTASSIUM CHLORIDE CRYS ER 20 MEQ PO TBCR
20.0000 meq | EXTENDED_RELEASE_TABLET | ORAL | Status: DC
  Administered 2012-12-05 – 2012-12-07 (×3): 20 meq via ORAL
  Filled 2012-12-04 (×4): qty 1

## 2012-12-04 MED ORDER — ASPIRIN 300 MG RE SUPP
300.0000 mg | RECTAL | Status: AC
  Filled 2012-12-04: qty 1

## 2012-12-04 MED ORDER — ONDANSETRON HCL 4 MG/2ML IJ SOLN: 4.0000 mg | Freq: Four times a day (QID) | INTRAMUSCULAR | Status: DC | PRN

## 2012-12-04 MED ORDER — INSULIN ASPART 100 UNIT/ML ~~LOC~~ SOLN
0.0000 [IU] | Freq: Three times a day (TID) | SUBCUTANEOUS | Status: DC
  Administered 2012-12-06: 3 [IU] via SUBCUTANEOUS
  Administered 2012-12-06: 1 [IU] via SUBCUTANEOUS
  Administered 2012-12-06 – 2012-12-07 (×2): 2 [IU] via SUBCUTANEOUS
  Administered 2012-12-07: 1 [IU] via SUBCUTANEOUS

## 2012-12-04 MED ORDER — IPRATROPIUM BROMIDE 0.02 % IN SOLN: 0.5000 mg | RESPIRATORY_TRACT | Status: DC | PRN

## 2012-12-04 MED ORDER — CARBIDOPA-LEVODOPA-ENTACAPONE 25-100-200 MG PO TABS: 1.0000 | ORAL_TABLET | Freq: Three times a day (TID) | ORAL | Status: DC

## 2012-12-04 MED ORDER — ALBUTEROL SULFATE (5 MG/ML) 0.5% IN NEBU: 2.5000 mg | INHALATION_SOLUTION | RESPIRATORY_TRACT | Status: DC | PRN

## 2012-12-04 MED ORDER — LORATADINE 10 MG PO TABS
10.0000 mg | ORAL_TABLET | ORAL | Status: DC
  Administered 2012-12-05 – 2012-12-07 (×3): 10 mg via ORAL
  Filled 2012-12-04 (×4): qty 1

## 2012-12-04 MED ORDER — NIACIN 500 MG PO TABS
500.0000 mg | ORAL_TABLET | Freq: Two times a day (BID) | ORAL | Status: DC
  Administered 2012-12-05 – 2012-12-07 (×5): 500 mg via ORAL
  Filled 2012-12-04 (×7): qty 1

## 2012-12-04 MED ORDER — ASPIRIN EC 81 MG PO TBEC
81.0000 mg | DELAYED_RELEASE_TABLET | Freq: Every day | ORAL | Status: DC
  Administered 2012-12-05 – 2012-12-07 (×3): 81 mg via ORAL
  Filled 2012-12-04 (×3): qty 1

## 2012-12-04 MED ORDER — CARBIDOPA-LEVODOPA 25-100 MG PO TABS
1.0000 | ORAL_TABLET | Freq: Three times a day (TID) | ORAL | Status: DC
  Administered 2012-12-04 – 2012-12-07 (×8): 1 via ORAL
  Filled 2012-12-04 (×11): qty 1

## 2012-12-04 MED ORDER — IPRATROPIUM-ALBUTEROL 0.5-2.5 (3) MG/3ML IN SOLN: 3.0000 mL | RESPIRATORY_TRACT | Status: DC | PRN

## 2012-12-04 MED ORDER — ACETAMINOPHEN 325 MG PO TABS: 650.0000 mg | ORAL_TABLET | ORAL | Status: DC | PRN

## 2012-12-04 NOTE — ED Provider Notes (Signed)
History     CSN: [id number]  Arrival date & time 12/04/12  1549   First MD Initiated Contact with Patient 12/04/12 1556      Chief Complaint  Patient presents with  . Nausea  . Irregular Heart Beat    (Consider location/radiation/quality/duration/timing/severity/associated sxs/prior treatment) Patient is a 79 y.o. male presenting with syncope.  Loss of Consciousness Episode history:  Single Most recent episode:  Today Duration: very brief. Timing:  Constant Progression:  Resolved Chronicity:  New Context: sitting down   Witnessed: yes   Relieved by:  Nothing Worsened by:  Nothing tried Ineffective treatments:  None tried Associated symptoms: nausea, recent fall and vomiting   Associated symptoms: no chest pain, no diaphoresis, no difficulty breathing, no dizziness, no fever, no focal sensory loss, no headaches, no recent injury, no seizures and no shortness of breath     Past Medical History  Diagnosis Date  . Hypertension   . Stroke 05-03-11    TIA- more than 20 yrs ago.  . Asthma   . Shortness of breath 05-03-11    SOB with exertion only  . Diabetes mellitus 05-03-11    Oral meds used-Dabetes x5 yrs-CBG's (145)  . Neuromuscular disorder 05-03-11    Dx. Parkinson's -essential tremors-generally, ambulates with walker  . Arthritis 05-03-11    most joints- Bil. hips/knees replaced in the past  . Skin cancer 05-03-11    Past hx. skin cancer lesions-removed  . DEMENTIA 05-03-11    hx. Alzheimers-tx. Aricept  . Irregular heart beat     Past Surgical History  Procedure Laterality Date  . Joint replacement  05-03-11    Bil. hip/knee repalcements-now RT. Hip to be revised 05-11-11  . Tonsillectomy    . Uvulopalatopharyngoplasty  05-03-11    x2 . Can.t tolerate cpap machine, doesn't use  . Cholecystectomy    . Eye surgery  05-03-11    lt. eye for lazy eye  . Carpal tunnel release  05-03-11    bilateral  . Total hip revision  05/11/2011    Procedure: TOTAL HIP  REVISION;  Surgeon: Gus Rankin Aluisio;  Location: [facility];  Service: Orthopedics;  Laterality: Right;    No family history on file.  History  Substance Use Topics  . Smoking status: Former Smoker -- 1.50 packs/day for 0 years  . Smokeless tobacco: Not on file  . Alcohol Use: Not on file      Review of Systems  Constitutional: Negative for fever, chills and diaphoresis.  HENT: Negative for congestion, sore throat and rhinorrhea.   Eyes: Negative for photophobia and visual disturbance.  Respiratory: Negative for cough and shortness of breath.   Cardiovascular: Positive for syncope. Negative for chest pain and leg swelling.  Gastrointestinal: Positive for nausea and vomiting. Negative for abdominal pain, diarrhea and constipation.  Endocrine: Negative for polydipsia and polyuria.  Genitourinary: Negative for dysuria and hematuria.  Musculoskeletal: Negative for back pain and arthralgias.  Skin: Negative for color change and rash.  Neurological: Negative for dizziness, seizures, syncope, light-headedness and headaches.  Hematological: Negative for adenopathy. Does not bruise/bleed easily.  All other systems reviewed and are negative.    Allergies  Review of patient's allergies indicates no known allergies.  Home Medications   No current outpatient prescriptions on file.  BP 120/44  Pulse 50  Temp(Src) 98.7 F (37.1 C) (Oral)  Resp 18  SpO2 100%  Physical Exam  Vitals reviewed. Constitutional: He is oriented to person, place, and time. He appears  well-developed and well-nourished.  HENT:  Head: Normocephalic and atraumatic.  Eyes: Conjunctivae and EOM are normal.  Neck: Normal range of motion. Neck supple.  Cardiovascular: Regular rhythm and normal heart sounds.  Bradycardia present.   Pulmonary/Chest: Effort normal and breath sounds normal. No respiratory distress.  Abdominal: He exhibits no distension. There is no tenderness. There is no rebound and no guarding.   Musculoskeletal: Normal range of motion.  Neurological: He is alert and oriented to person, place, and time. He has normal strength and normal reflexes. No cranial nerve deficit or sensory deficit.  Skin: Skin is warm and dry.    ED Course  Procedures (including critical care time)  Labs Reviewed  CBC WITH DIFFERENTIAL - Abnormal; Notable for the following:    RBC 3.60 (*)    Hemoglobin 11.5 (*)    HCT 32.9 (*)    Eosinophils Relative 6 (*)    All other components within normal limits  BASIC METABOLIC PANEL - Abnormal; Notable for the following:    Sodium 134 (*)    Chloride 95 (*)    Glucose, Bld 143 (*)    BUN 61 (*)    Creatinine, Ser 1.93 (*)    GFR calc non Af Amer 31 (*)    GFR calc Af Amer 36 (*)    All other components within normal limits  CBC WITH DIFFERENTIAL - Abnormal; Notable for the following:    RBC 3.37 (*)    Hemoglobin 10.7 (*)    HCT 30.6 (*)    Platelets 142 (*)    All other components within normal limits  COMPREHENSIVE METABOLIC PANEL - Abnormal; Notable for the following:    Sodium 133 (*)    Glucose, Bld 185 (*)    BUN 61 (*)    Creatinine, Ser 1.86 (*)    GFR calc non Af Amer 33 (*)    GFR calc Af Amer 38 (*)    All other components within normal limits  POCT I-STAT, CHEM 8 - Abnormal; Notable for the following:    Sodium 134 (*)    BUN 58 (*)    Creatinine, Ser 2.20 (*)    Glucose, Bld 146 (*)    Calcium, Ion 1.04 (*)    Hemoglobin 11.2 (*)    HCT 33.0 (*)    All other components within normal limits  MRSA PCR SCREENING  TROPONIN I  PROTIME-INR  APTT  TROPONIN I  TROPONIN I  TSH  CBC  BASIC METABOLIC PANEL  CBC  HEMOGLOBIN A1C  LIPID PANEL  POCT I-STAT TROPONIN I   Results for orders placed during the hospital encounter of 12/04/12  MRSA PCR SCREENING      Result Value Range   MRSA by PCR NEGATIVE  NEGATIVE  CBC WITH DIFFERENTIAL      Result Value Range   WBC 10.3  4.0 - 10.5 K/uL   RBC 3.60 (*) 4.22 - 5.81 MIL/uL    Hemoglobin 11.5 (*) 13.0 - 17.0 g/dL   HCT 16.1 (*) 09.6 - 04.5 %   MCV 91.4  78.0 - 100.0 fL   MCH 31.9  26.0 - 34.0 pg   MCHC 35.0  30.0 - 36.0 g/dL   RDW 40.9  81.1 - 91.4 %   Platelets 162  150 - 400 K/uL   Neutrophils Relative % 68  43 - 77 %   Neutro Abs 7.0  1.7 - 7.7 K/uL   Lymphocytes Relative 18  12 - 46 %  Lymphs Abs 1.9  0.7 - 4.0 K/uL   Monocytes Relative 8  3 - 12 %   Monocytes Absolute 0.8  0.1 - 1.0 K/uL   Eosinophils Relative 6 (*) 0 - 5 %   Eosinophils Absolute 0.6  0.0 - 0.7 K/uL   Basophils Relative 0  0 - 1 %   Basophils Absolute 0.0  0.0 - 0.1 K/uL  BASIC METABOLIC PANEL      Result Value Range   Sodium 134 (*) 135 - 145 mEq/L   Potassium 4.1  3.5 - 5.1 mEq/L   Chloride 95 (*) 96 - 112 mEq/L   CO2 25  19 - 32 mEq/L   Glucose, Bld 143 (*) 70 - 99 mg/dL   BUN 61 (*) 6 - 23 mg/dL   Creatinine, Ser 6.29 (*) 0.50 - 1.35 mg/dL   Calcium 9.2  8.4 - 52.8 mg/dL   GFR calc non Af Amer 31 (*) >90 mL/min   GFR calc Af Amer 36 (*) >90 mL/min  TROPONIN I      Result Value Range   Troponin I <0.30  <0.30 ng/mL  PROTIME-INR      Result Value Range   Prothrombin Time 13.0  11.6 - 15.2 seconds   INR 0.99  0.00 - 1.49  APTT      Result Value Range   aPTT 32  24 - 37 seconds  CBC WITH DIFFERENTIAL      Result Value Range   WBC 8.4  4.0 - 10.5 K/uL   RBC 3.37 (*) 4.22 - 5.81 MIL/uL   Hemoglobin 10.7 (*) 13.0 - 17.0 g/dL   HCT 41.3 (*) 24.4 - 01.0 %   MCV 90.8  78.0 - 100.0 fL   MCH 31.8  26.0 - 34.0 pg   MCHC 35.0  30.0 - 36.0 g/dL   RDW 27.2  53.6 - 64.4 %   Platelets 142 (*) 150 - 400 K/uL   Neutrophils Relative % 74  43 - 77 %   Neutro Abs 6.2  1.7 - 7.7 K/uL   Lymphocytes Relative 15  12 - 46 %   Lymphs Abs 1.3  0.7 - 4.0 K/uL   Monocytes Relative 7  3 - 12 %   Monocytes Absolute 0.6  0.1 - 1.0 K/uL   Eosinophils Relative 4  0 - 5 %   Eosinophils Absolute 0.4  0.0 - 0.7 K/uL   Basophils Relative 0  0 - 1 %   Basophils Absolute 0.0  0.0 - 0.1 K/uL   COMPREHENSIVE METABOLIC PANEL      Result Value Range   Sodium 133 (*) 135 - 145 mEq/L   Potassium 4.4  3.5 - 5.1 mEq/L   Chloride 97  96 - 112 mEq/L   CO2 27  19 - 32 mEq/L   Glucose, Bld 185 (*) 70 - 99 mg/dL   BUN 61 (*) 6 - 23 mg/dL   Creatinine, Ser 0.34 (*) 0.50 - 1.35 mg/dL   Calcium 9.2  8.4 - 74.2 mg/dL   Total Protein 6.8  6.0 - 8.3 g/dL   Albumin 3.5  3.5 - 5.2 g/dL   AST 14  0 - 37 U/L   ALT 6  0 - 53 U/L   Alkaline Phosphatase 45  39 - 117 U/L   Total Bilirubin 0.4  0.3 - 1.2 mg/dL   GFR calc non Af Amer 33 (*) >90 mL/min   GFR calc Af Amer 38 (*) >90 mL/min  POCT I-STAT, CHEM 8      Result Value Range   Sodium 134 (*) 135 - 145 mEq/L   Potassium 4.2  3.5 - 5.1 mEq/L   Chloride 102  96 - 112 mEq/L   BUN 58 (*) 6 - 23 mg/dL   Creatinine, Ser 1.61 (*) 0.50 - 1.35 mg/dL   Glucose, Bld 096 (*) 70 - 99 mg/dL   Calcium, Ion 0.45 (*) 1.13 - 1.30 mmol/L   TCO2 27  0 - 100 mmol/L   Hemoglobin 11.2 (*) 13.0 - 17.0 g/dL   HCT 40.9 (*) 81.1 - 91.4 %  POCT I-STAT TROPONIN I      Result Value Range   Troponin i, poc 0.07  0.00 - 0.08 ng/mL   Comment 3             Dg Chest Portable 1 View  12/04/2012   *RADIOLOGY REPORT*  Clinical Data: Nausea and irregular heart beat  PORTABLE CHEST - 1 VIEW  Comparison: 05/18/2011.  Findings: There is mild cardiac enlargement.  No pleural effusion or edema.  Basilar predominant interstitial coarsening is noted bilaterally.  No airspace consolidation.  IMPRESSION:  1.  No acute cardiopulmonary abnormalities. 2.  Chronic interstitial coarsening.   Original Report Authenticated By: Signa Kell, M.D.     1. Syncope      Date: 12/04/2012  Rate: 57  Rhythm: Sinus brady  QRS Axis: normal  Intervals: normal  ST/T Wave abnormalities: normal  Conduction Disutrbances:bifascicular block  Narrative Interpretation: sinus brady with bifascicular block  Old EKG Reviewed: unchanged    MDM   79 y.o. male  with pertinent PMH of dementia, DM,  parkinsons, prior cva presents with syncope and fall.  Pt has no ho similar, no ho CAD.   Syncope witnessed with fall from chair onto ground.  Pt awoke very briefly thereafter, however vomited enroute.  On EMS arrival pt with bradycardia, sinus, however with intermittent pause without sinus firing or ventricular conduction. PR intervals unchanged between beats, no discernable drop pattern.  ECG here with regular sinus bradycardia.  Physical exam here without signs of trauma to head, no pain reported by patient.  Syncopal episode occurred prior to fall.  Do not feel pt warrants head CT given no signs of trauma to head and low level fall without obvious hit to head.  Cardiology consulted after return of unremarkable labs, pt admitted.    Labs and imaging as above reviewed by myself and attending,Dr. Bebe Shaggy, with whom case was discussed.   1. Syncope             Noel Gerold, MD 12/05/12 0100

## 2012-12-04 NOTE — ED Notes (Signed)
Pt presents to ED via [facility]. EMS was called for a fall. When EMS arrived they found patient outside sitting on a swing. Upon arrival by EMS they found him to have a near syncopal episode and to be actively vomiting x2. EMS gave patient zofran 4mg  and started IV on scene. Once on truck they found that pt has a bundle branch block and was having episodes of "sinus arrest." Pt A&O upon arrival to ED. HR 57. O2 96% on 2L. Pt denies any chest pain at this time.

## 2012-12-04 NOTE — H&P (Signed)
Preston Carter is an 79 y.o. male.   Chief Complaint: Status post syncope HPI: Patient is 79 year old male with past medical history significant for hypertension, diabetes mellitus, history of CVA/TIA in the past, morbid obesity, obstructive sleep apnea/obesity hypoventilation syndrome, history of Parkinson's disease, history of dementia, chronic kidney disease, degenerative joint disease, remote tobacco abuse 20+ pack years quit 30 years ago, came to the ER by EMS following syncopal episode as per patient's wife patient was sitting in the swing outside and suddenly became unresponsive. Patient does not recall any events prior to this episode. Patient denies any chest pain nausea vomiting or diaphoresis. Denies any palpitations. Denies such episodes in the past. EKG done in the ER showed marked sinus bradycardia with bifascicular block heart rate in the 50s at present patient denies any dizziness lightheadedness.  Past Medical History  Diagnosis Date  . Hypertension   . Stroke 05-03-11    TIA- more than 20 yrs ago.  . Asthma   . Shortness of breath 05-03-11    SOB with exertion only  . Diabetes mellitus 05-03-11    Oral meds used-Dabetes x5 yrs-CBG's (145)  . Neuromuscular disorder 05-03-11    Dx. Parkinson's -essential tremors-generally, ambulates with walker  . Arthritis 05-03-11    most joints- Bil. hips/knees replaced in the past  . Skin cancer 05-03-11    Past hx. skin cancer lesions-removed  . DEMENTIA 05-03-11    hx. Alzheimers-tx. Aricept  . Irregular heart beat     Past Surgical History  Procedure Laterality Date  . Joint replacement  05-03-11    Bil. hip/knee repalcements-now RT. Hip to be revised 05-11-11  . Tonsillectomy    . Uvulopalatopharyngoplasty  05-03-11    x2 . Can.t tolerate cpap machine, doesn't use  . Cholecystectomy    . Eye surgery  05-03-11    lt. eye for lazy eye  . Carpal tunnel release  05-03-11    bilateral  . Total hip revision  05/11/2011   Procedure: TOTAL HIP REVISION;  Surgeon: Gus Rankin Aluisio;  Location: [facility];  Service: Orthopedics;  Laterality: Right;    No family history on file. Social History:  reports that he has quit smoking. He does not have any smokeless tobacco history on file. His alcohol and drug histories are not on file.  Allergies: No Known Allergies   (Not in a hospital admission)  Results for orders placed during the hospital encounter of 12/04/12 (from the past 48 hour(s))  POCT I-STAT TROPONIN I     Status: None   Collection Time    12/04/12  4:45 PM      Result Value Range   Troponin i, poc 0.07  0.00 - 0.08 ng/mL   Comment 3            Comment: Due to the release kinetics of cTnI,     a negative result within the first hours     of the onset of symptoms does not rule out     myocardial infarction with certainty.     If myocardial infarction is still suspected,     repeat the test at appropriate intervals.  POCT I-STAT, CHEM 8     Status: Abnormal   Collection Time    12/04/12  4:47 PM      Result Value Range   Sodium 134 (*) 135 - 145 mEq/L   Potassium 4.2  3.5 - 5.1 mEq/L   Chloride 102  96 - 112 mEq/L  BUN 58 (*) 6 - 23 mg/dL   Creatinine, Ser 1.61 (*) 0.50 - 1.35 mg/dL   Glucose, Bld 096 (*) 70 - 99 mg/dL   Calcium, Ion 0.45 (*) 1.13 - 1.30 mmol/L   TCO2 27  0 - 100 mmol/L   Hemoglobin 11.2 (*) 13.0 - 17.0 g/dL   HCT 40.9 (*) 81.1 - 91.4 %  CBC WITH DIFFERENTIAL     Status: Abnormal   Collection Time    12/04/12  6:00 PM      Result Value Range   WBC 10.3  4.0 - 10.5 K/uL   RBC 3.60 (*) 4.22 - 5.81 MIL/uL   Hemoglobin 11.5 (*) 13.0 - 17.0 g/dL   HCT 78.2 (*) 95.6 - 21.3 %   MCV 91.4  78.0 - 100.0 fL   MCH 31.9  26.0 - 34.0 pg   MCHC 35.0  30.0 - 36.0 g/dL   RDW 08.6  57.8 - 46.9 %   Platelets 162  150 - 400 K/uL   Neutrophils Relative % 68  43 - 77 %   Neutro Abs 7.0  1.7 - 7.7 K/uL   Lymphocytes Relative 18  12 - 46 %   Lymphs Abs 1.9  0.7 - 4.0 K/uL   Monocytes  Relative 8  3 - 12 %   Monocytes Absolute 0.8  0.1 - 1.0 K/uL   Eosinophils Relative 6 (*) 0 - 5 %   Eosinophils Absolute 0.6  0.0 - 0.7 K/uL   Basophils Relative 0  0 - 1 %   Basophils Absolute 0.0  0.0 - 0.1 K/uL  BASIC METABOLIC PANEL     Status: Abnormal   Collection Time    12/04/12  6:00 PM      Result Value Range   Sodium 134 (*) 135 - 145 mEq/L   Potassium 4.1  3.5 - 5.1 mEq/L   Chloride 95 (*) 96 - 112 mEq/L   CO2 25  19 - 32 mEq/L   Glucose, Bld 143 (*) 70 - 99 mg/dL   BUN 61 (*) 6 - 23 mg/dL   Creatinine, Ser 6.29 (*) 0.50 - 1.35 mg/dL   Calcium 9.2  8.4 - 52.8 mg/dL   GFR calc non Af Amer 31 (*) >90 mL/min   GFR calc Af Amer 36 (*) >90 mL/min   Comment:            The eGFR has been calculated     using the CKD EPI equation.     This calculation has not been     validated in all clinical     situations.     eGFR's persistently     <90 mL/min signify     possible Chronic Kidney Disease.   Dg Chest Portable 1 View  12/04/2012   *RADIOLOGY REPORT*  Clinical Data: Nausea and irregular heart beat  PORTABLE CHEST - 1 VIEW  Comparison: 05/18/2011.  Findings: There is mild cardiac enlargement.  No pleural effusion or edema.  Basilar predominant interstitial coarsening is noted bilaterally.  No airspace consolidation.  IMPRESSION:  1.  No acute cardiopulmonary abnormalities. 2.  Chronic interstitial coarsening.   Original Report Authenticated By: Signa Kell, M.D.    Review of Systems  Constitutional: Negative for fever and chills.  Eyes: Negative for blurred vision and double vision.  Respiratory: Negative for cough, hemoptysis and sputum production.   Cardiovascular: Negative for chest pain, palpitations, orthopnea and claudication.  Gastrointestinal: Negative for nausea, vomiting and abdominal pain.  Neurological: Positive for dizziness. Negative for sensory change, speech change, focal weakness, seizures and headaches.    Blood pressure 110/56, temperature 98.9 F  (37.2 C), temperature source Oral, resp. rate 15, SpO2 100.00%. Physical Exam  Constitutional: He is oriented to person, place, and time.  HENT:  Head: Normocephalic and atraumatic.  Eyes: Conjunctivae and EOM are normal. Pupils are equal, round, and reactive to light. No scleral icterus.  Neck: Normal range of motion. Neck supple. No JVD present. No tracheal deviation present. No thyromegaly present.  Cardiovascular: Normal rate and regular rhythm.   Murmur (Soft systolic murmur noted no S3 gallop) heard. Respiratory:  Decreased breath sound at bases  GI: Soft. Bowel sounds are normal. He exhibits distension. There is no tenderness. There is no rebound and no guarding.  Musculoskeletal: He exhibits no edema and no tenderness.  Neurological: He is alert and oriented to person, place, and time.     Assessment/Plan Status post symptomatic bradycardia Status post syncope Hypertension Diabetes matters History of CVA/TIA in the past Obstructive sleep apnea/obesity hypoventilation syndrome Morbid obesity next history of Parkinson's disease Dementia Chronic kidney disease stage IV Degenerative joint disease History of remote tobacco abuse Plan As per orders Locate EP consult tomorrow for possible permanent pacemaker insertion Continue transcutaneous pacer pads.  Mang Hazelrigg N 12/04/2012, 7:07 PM

## 2012-12-05 ENCOUNTER — Encounter (HOSPITAL_COMMUNITY)
Admission: EM | Disposition: A | Discharge status: Home / Self Care | Payer: Self-pay | Source: Home / Self Care | Attending: Cardiology

## 2012-12-05 ENCOUNTER — Encounter (HOSPITAL_COMMUNITY): Payer: Self-pay | Admitting: Internal Medicine

## 2012-12-05 DIAGNOSIS — I495 Sick sinus syndrome: Principal | ICD-10-CM

## 2012-12-05 DIAGNOSIS — I455 Other specified heart block: Secondary | ICD-10-CM

## 2012-12-05 DIAGNOSIS — R55 Syncope and collapse: Secondary | ICD-10-CM

## 2012-12-05 DIAGNOSIS — W19XXXA Unspecified fall, initial encounter: Secondary | ICD-10-CM

## 2012-12-05 HISTORY — PX: LOOP RECORDER IMPLANT: SHX5477

## 2012-12-05 LAB — TROPONIN I: Troponin I: <0.3 ng/mL (ref <0.30)

## 2012-12-05 LAB — CBC
MCHC: 34.9 g/dL (ref 30.0–36.0)
Platelets: 150 10*3/uL (ref 150–400)
RDW: 14.4 % (ref 11.5–15.5)
WBC: 9 10*3/uL (ref 4.0–10.5)

## 2012-12-05 LAB — BASIC METABOLIC PANEL
BUN: 59 mg/dL — ABNORMAL HIGH (ref 6–23)
Chloride: 98 mEq/L (ref 96–112)
GFR calc Af Amer: 39 mL/min — ABNORMAL LOW (ref >90)
GFR calc non Af Amer: 34 mL/min — ABNORMAL LOW (ref >90)
Potassium: 4.2 mEq/L (ref 3.5–5.1)
Sodium: 134 mEq/L — ABNORMAL LOW (ref 135–145)

## 2012-12-05 LAB — HEMOGLOBIN A1C: Hgb A1c MFr Bld: 5.9 % — ABNORMAL HIGH (ref <5.7)

## 2012-12-05 LAB — GLUCOSE, CAPILLARY
Glucose-Capillary: 110 mg/dL — ABNORMAL HIGH (ref 70–99)
Glucose-Capillary: 113 mg/dL — ABNORMAL HIGH (ref 70–99)
Glucose-Capillary: 220 mg/dL — ABNORMAL HIGH (ref 70–99)

## 2012-12-05 LAB — LIPID PANEL: LDL Cholesterol: 66 mg/dL (ref 0–99)

## 2012-12-05 SURGERY — LOOP RECORDER IMPLANT
Anesthesia: LOCAL

## 2012-12-05 MED ORDER — PERFLUTREN LIPID MICROSPHERE
INTRAVENOUS | Status: AC
  Administered 2012-12-05: 2 mL via INTRAVENOUS
  Filled 2012-12-05: qty 10

## 2012-12-05 MED ORDER — HEPARIN BOLUS VIA INFUSION
2000.0000 [IU] | Freq: Once | INTRAVENOUS | Status: AC
  Administered 2012-12-05: 2000 [IU] via INTRAVENOUS
  Filled 2012-12-05: qty 2000

## 2012-12-05 MED ORDER — PERFLUTREN LIPID MICROSPHERE
1.0000 mL | INTRAVENOUS | Status: AC | PRN
  Filled 2012-12-05: qty 10

## 2012-12-05 MED ORDER — HEPARIN (PORCINE) IN NACL 100-0.45 UNIT/ML-% IJ SOLN
1450.0000 [IU]/h | INTRAMUSCULAR | Status: DC
  Administered 2012-12-05: 1450 [IU]/h via INTRAVENOUS
  Filled 2012-12-05 (×2): qty 250

## 2012-12-05 MED ORDER — SODIUM CHLORIDE 0.9 % IV SOLN
INTRAVENOUS | Status: DC
  Administered 2012-12-05: 09:00:00 via INTRAVENOUS

## 2012-12-05 NOTE — Progress Notes (Signed)
Had 4.21 sec pause and followed by short pause on and off, asymptomatic, PA made aware with no order, continue to monitor, zoll at bedside.

## 2012-12-05 NOTE — Progress Notes (Signed)
Echocardiogram 2D Echocardiogram with Definity has been performed.  Shaquile Lutze 12/05/2012, 1:20 PM

## 2012-12-05 NOTE — Interval H&P Note (Signed)
History and Physical Interval Note:  12/05/2012 2:05 PM  Preston Carter  has presented today for surgery, with the diagnosis of Syncope  The various methods of treatment have been discussed with the patient and family. After consideration of risks, benefits and other options for treatment, the patient has consented to  Procedure(s): LOOP RECORDER IMPLANT (N/A) as a surgical intervention .  The patient's history has been reviewed, patient examined, no change in status, stable for surgery.  I have reviewed the patient's chart and labs.  Questions were answered to the patient's satisfaction.     Sherryl Manges

## 2012-12-05 NOTE — Progress Notes (Signed)
1600H- lying v/s- bp 116/37, hr 55, r- 18 1610H- sitting- bp 135/46, hr-54, r- 16 1615-standing-bp- 159/50,hr-53.r-16

## 2012-12-05 NOTE — Progress Notes (Addendum)
ANTICOAGULATION CONSULT NOTE - Initial Consult  Pharmacy Consult for Heparin Indication: Afib  No Known Allergies  Patient Measurements: Height: 5' 6.14" (168 cm) Weight: 288 lb 5.8 oz (130.8 kg) IBW/kg (Calculated) : 64.13 Heparin Dosing Weight: 95 kg   Vital Signs: Temp: 98.8 F (37.1 C) (06/18 0415) Temp src: Oral (06/18 0415) BP: 109/34 mmHg (06/18 0400) Pulse Rate: 49 (06/18 0400)  Labs:  Recent Labs  12/04/12 1800 12/04/12 2058 12/05/12 0348  HGB 11.5* 10.7* 10.7*  HCT 32.9* 30.6* 30.7*  PLT 162 142* 150  APTT  --  32  --   LABPROT  --  13.0  --   INR  --  0.99  --   CREATININE 1.93* 1.86* 1.81*  TROPONINI  --  <0.30 <0.30    Estimated Creatinine Clearance: 42.5 ml/min (by C-G formula based on Cr of 1.81).   Medical History: Past Medical History  Diagnosis Date  . Hypertension   . Stroke 05-03-11    TIA- more than 20 yrs ago.  . Asthma   . Shortness of breath 05-03-11    SOB with exertion only  . Diabetes mellitus 05-03-11    Oral meds used-Dabetes x5 yrs-CBG's (145)  . Neuromuscular disorder 05-03-11    Dx. Parkinson's -essential tremors-generally, ambulates with walker  . Arthritis 05-03-11    most joints- Bil. hips/knees replaced in the past  . Skin cancer 05-03-11    Past hx. skin cancer lesions-removed  . DEMENTIA 05-03-11    hx. Alzheimers-tx. Aricept  . Irregular heart beat     Medications:  Prescriptions prior to admission  Medication Sig Dispense Refill  . carbidopa-levodopa-entacapone (STALEVO) 25-100-200 MG per tablet Take 1 tablet by mouth 3 (three) times daily.       Marland Kitchen donepezil (ARICEPT) 5 MG tablet Take 5 mg by mouth at bedtime.       . furosemide (LASIX) 40 MG tablet Take 40 mg by mouth 2 (two) times daily.       Marland Kitchen glimepiride (AMARYL) 2 MG tablet Take 2 mg by mouth daily. Pt takes at 2 pm       . guaiFENesin-dextromethorphan (ROBITUSSIN DM) 100-10 MG/5ML syrup Take 5 mLs by mouth 3 (three) times daily as needed. cough       . ipratropium-albuterol (DUONEB) 0.5-2.5 (3) MG/3ML SOLN Take 3 mLs by nebulization every 4 (four) hours as needed. wheezing      . ketoconazole (NIZORAL) 2 % cream Apply 1 application topically daily as needed. itching      . loratadine (CLARITIN) 10 MG tablet Take 10 mg by mouth every morning.       . mometasone (NASONEX) 50 MCG/ACT nasal spray Place 2 sprays into the nose daily as needed. Allergies        . multivitamin-lutein (OCUVITE-LUTEIN) CAPS Take 1 capsule by mouth daily.        . niacin 500 MG tablet Take 500 mg by mouth 2 (two) times daily with a meal.        . olmesartan (BENICAR) 20 MG tablet Take 20 mg by mouth every morning.       . Olopatadine HCl (PATADAY) 0.2 % SOLN Place 1 drop into both eyes daily as needed. Dry eye      . OVER THE COUNTER MEDICATION Take 1 tablet by mouth 3 (three) times daily. Lithium Aspartate 5 mg       . potassium chloride SA (K-DUR,KLOR-CON) 20 MEQ tablet Take 20 mEq by mouth every morning.       Marland Kitchen  simvastatin (ZOCOR) 40 MG tablet Take 40 mg by mouth at bedtime.       . temazepam (RESTORIL) 30 MG capsule Take 30 mg by mouth at bedtime.       . traMADol (ULTRAM) 50 MG tablet Take 50 mg by mouth every 8 (eight) hours as needed. Pain        . triamterene-hydrochlorothiazide (MAXZIDE) 75-50 MG per tablet Take 1 tablet by mouth every morning.         Assessment: 79 yo male with Afib for heparin  Goal of Therapy:  Heparin level 0.3-0.7 units/ml Monitor platelets by anticoagulation protocol: Yes   Plan:  Heparin 2000 units IV bolus, then 1450 units/hr Check heparin level in 8 hours.  Eddie Candle 12/05/2012,7:23 AM

## 2012-12-05 NOTE — Progress Notes (Signed)
Pt noted to have a change in rhythm. 12 lead EKG performed. MD called regarding changes. Heparin orders to be placed. Will continue to monitor.

## 2012-12-05 NOTE — Progress Notes (Signed)
Subjective:  Patient denies any chest pain or shortness of breath. No further episodes of syncope. 2-D echo is pending. Sinus bradycardia on monitor with occasional junctional escape rhythm.  Objective:  Vital Signs in the last 24 hours: Temp:  [98.2 F (36.8 C)-99.4 F (37.4 C)] 99.4 F (37.4 C) (06/18 0800) Pulse Rate:  [32-60] 32 (06/18 0700) Resp:  [12-23] 19 (06/18 0700) BP: (95-125)/(34-56) 105/51 mmHg (06/18 0700) SpO2:  [93 %-100 %] 98 % (06/18 0700) Weight:  [130.8 kg (288 lb 5.8 oz)] 130.8 kg (288 lb 5.8 oz) (06/18 0700)  Intake/Output from previous day: 06/17 0701 - 06/18 0700 In: 240 [P.O.:240] Out: 475 [Urine:475] Intake/Output from this shift: Total I/O In: 409 [P.O.:360; I.V.:49] Out: 400 [Urine:400]  Physical Exam: Neck: no adenopathy, no carotid bruit, no JVD and supple, symmetrical, trachea midline Lungs: decreased breath sound at bases Heart: regular rate and rhythm, S1, S2 normal and soft systolic murmur noted Abdomen: soft, non-tender; bowel sounds normal; no masses,  no organomegaly Extremities: extremities normal, atraumatic, no cyanosis or edema  Lab Results:  Recent Labs  12/04/12 2058 12/05/12 0348  WBC 8.4 9.0  HGB 10.7* 10.7*  PLT 142* 150    Recent Labs  12/04/12 2058 12/05/12 0348  NA 133* 134*  K 4.4 4.2  CL 97 98  CO2 27 26  GLUCOSE 185* 98  BUN 61* 59*  CREATININE 1.86* 1.81*    Recent Labs  12/05/12 0348 12/05/12 0853  TROPONINI <0.30 <0.30   Hepatic Function Panel  Recent Labs  12/04/12 2058  PROT 6.8  ALBUMIN 3.5  AST 14  ALT 6  ALKPHOS 45  BILITOT 0.4    Recent Labs  12/05/12 0348  CHOL 157   No results found for this basename: PROTIME,  in the last 72 hours  Imaging: Imaging results have been reviewed and Dg Chest Portable 1 View  12/04/2012   *RADIOLOGY REPORT*  Clinical Data: Nausea and irregular heart beat  PORTABLE CHEST - 1 VIEW  Comparison: 05/18/2011.  Findings: There is mild cardiac  enlargement.  No pleural effusion or edema.  Basilar predominant interstitial coarsening is noted bilaterally.  No airspace consolidation.  IMPRESSION:  1.  No acute cardiopulmonary abnormalities. 2.  Chronic interstitial coarsening.   Original Report Authenticated By: Signa Kell, M.D.    Cardiac Studies:  Assessment/Plan:  Status post symptomatic bradycardia/junctional escape rhythm rule out sick sinus syndrome Status post syncope Hypertension Diabetes matters History of CVA in the past Obstructive sleep apnea/obesity hypoventilation syndrome Morbid obesity [name] disease Dementia Chronic kidney disease stage IV improved Degenerative joint disease History of remote tobacco abuse Plan Check 2-D echo EP consult for possible  pacemaker  LOS: 1 day    Preston Carter N 12/05/2012, 1:03 PM

## 2012-12-05 NOTE — Consult Note (Signed)
  ELECTROPHYSIOLOGY CONSULT NOTE    Patient ID: Preston Carter MRN: [id number], DOB/AGE: 77/15/1933 77 y.o.  Admit date: 12/04/2012 Date of Consult: 12-05-2012  Primary Physician: PATERSON,DANIEL G, MD Primary Cardiologist: Harwani (new this admission)  Reason for Consultation: syncope  HPI:  [name] is a 79 year old male with a past medical history significant for hypertension, stroke, diabetes, obesity, sleep apnea (on CPAP), and dementia.    On the day of admission, he was sitting outside in a swing under the shelter.  His grandson found him on the ground.  His wife called EMS when they were unable to move him back to the swing.  He had several episodes of vomiting once he regained consciousness.  His wife states that he had no prodrome and does not remember trying to get up or passing out.  On arrival to the ER, he was found to be bradycardic with bifascicular block.  He was admitted for further observation and testing.  Lab work has been remarkable for negative cardiac enzymes, Hgb 10.7, creat 1.81, BUN 59, sodium 134, normal TSH.  Echo is pending.   The patient's wife states that he has not complained of any chest pain or dizziness recently.  He does have chronic deconditioning and shortness of breath.  She states that he has had some periodic episodes of nausea but no vomiting.  He has not had any recent changes in medications.  The patient has a history of orthostatic falls. Of note, about 2 months ago, his Lasix was increased to twice daily and there has been a subsequent 25 pound weight loss. His wife has not noted particularly any worse dizziness with standing and her husband has not complained of that. It was her impression that from his physician in the ground that he had fallen after he tried to stand.  ROS is negative except as outlined above.  Past Medical History  Diagnosis Date  . Hypertension   . Stroke 05-03-11    TIA- more than 20 yrs ago.  . Asthma   .  Shortness of breath 05-03-11    SOB with exertion only  . Diabetes mellitus 05-03-11    Oral meds used-Dabetes x5 yrs-CBG's (145)  . Neuromuscular disorder 05-03-11    Dx. Parkinson's -essential tremors-generally, ambulates with walker  . Arthritis 05-03-11    most joints- Bil. hips/knees replaced in the past  . Skin cancer 05-03-11    Past hx. skin cancer lesions-removed  . DEMENTIA 05-03-11    hx. Alzheimers-tx. Aricept  . Irregular heart beat   . Anemia 05/14/2011     Surgical History:  Past Surgical History  Procedure Laterality Date  . Joint replacement  05-03-11    Bil. hip/knee repalcements-now RT. Hip to be revised 05-11-11  . Tonsillectomy    . Uvulopalatopharyngoplasty  05-03-11    x2 . Can.t tolerate cpap machine, doesn't use  . Cholecystectomy    . Eye surgery  05-03-11    lt. eye for lazy eye  . Carpal tunnel release  05-03-11    bilateral  . Total hip revision  05/11/2011    Procedure: TOTAL HIP REVISION;  Surgeon: Frank V Aluisio;  Location: [facility];  Service: Orthopedics;  Laterality: Right;     Prescriptions prior to admission  Medication Sig Dispense Refill  . carbidopa-levodopa-entacapone (STALEVO) 25-100-200 MG per tablet Take 1 tablet by mouth 3 (three) times daily.       . donepezil (ARICEPT) 5 MG tablet Take 5   mg by mouth at bedtime.       . furosemide (LASIX) 40 MG tablet Take 40 mg by mouth 2 (two) times daily.       . glimepiride (AMARYL) 2 MG tablet Take 2 mg by mouth daily. Pt takes at 2 pm       . guaiFENesin-dextromethorphan (ROBITUSSIN DM) 100-10 MG/5ML syrup Take 5 mLs by mouth 3 (three) times daily as needed. cough      . ipratropium-albuterol (DUONEB) 0.5-2.5 (3) MG/3ML SOLN Take 3 mLs by nebulization every 4 (four) hours as needed. wheezing      . ketoconazole (NIZORAL) 2 % cream Apply 1 application topically daily as needed. itching      . loratadine (CLARITIN) 10 MG tablet Take 10 mg by mouth every morning.       . mometasone (NASONEX)  50 MCG/ACT nasal spray Place 2 sprays into the nose daily as needed. Allergies        . multivitamin-lutein (OCUVITE-LUTEIN) CAPS Take 1 capsule by mouth daily.        . niacin 500 MG tablet Take 500 mg by mouth 2 (two) times daily with a meal.        . olmesartan (BENICAR) 20 MG tablet Take 20 mg by mouth every morning.       . Olopatadine HCl (PATADAY) 0.2 % SOLN Place 1 drop into both eyes daily as needed. Dry eye      . OVER THE COUNTER MEDICATION Take 1 tablet by mouth 3 (three) times daily. Lithium Aspartate 5 mg       . potassium chloride SA (K-DUR,KLOR-CON) 20 MEQ tablet Take 20 mEq by mouth every morning.       . simvastatin (ZOCOR) 40 MG tablet Take 40 mg by mouth at bedtime.       . temazepam (RESTORIL) 30 MG capsule Take 30 mg by mouth at bedtime.       . traMADol (ULTRAM) 50 MG tablet Take 50 mg by mouth every 8 (eight) hours as needed. Pain        . triamterene-hydrochlorothiazide (MAXZIDE) 75-50 MG per tablet Take 1 tablet by mouth every morning.         Inpatient Medications:  . aspirin EC  81 mg Oral Daily  . carbidopa-levodopa  1 tablet Oral TID   And  . entacapone  200 mg Oral TID  . insulin aspart  0-9 Units Subcutaneous TID WC  . loratadine  10 mg Oral BH-q7a  . niacin  500 mg Oral BID WC  . pantoprazole  40 mg Oral Q0600  . potassium chloride SA  20 mEq Oral BH-q7a    Allergies: No Known Allergies  History   Social History  . Marital Status: Married    Spouse Name: N/A    Number of Children: N/A  . Years of Education: N/A   Social History Main Topics  . Smoking status: Former Smoker -- 1.50 packs/day for 0 years  . Smokeless tobacco: Not on file  . Alcohol Use: Not on file  . Drug Use: Not on file  . Sexually Active: No   No family history on file.   BP 105/51  Pulse 32  Temp(Src) 99.4 F (37.4 C) (Oral)  Resp 19  Ht 5' 6.14" (1.68 m)  Wt 288 lb 5.8 oz (130.8 kg)  BMI 46.34 kg/m2  SpO2 98% Alert but sleepy morbidly obese in no acute  distress HENT- normal Eyes- EOMI, without scleral icterus Skin- warm and   dry; erythmema over the shins bilaterally with bandages LN-neg Neck- supple without thyromegaly, JVP-flat, carotids brisk and full without bruits Back-without CVAT or kyphosis Lungs-clear to auscultation CV-Irregular rate and rhythm, nl S1 and S2, no murmurs gallops or rubs,  Abd-soft with active bowel sounds; no midline pulsation or hepatomegaly Pulses-intact femoral and distal MKS-without gross deformity Neuro- Ax O, CN3-12 intact, grossly normal motor and sensory function Affect engaging    Labs:   Lab Results  Component Value Date   WBC 9.0 12/05/2012   HGB 10.7* 12/05/2012   HCT 30.7* 12/05/2012   MCV 90.6 12/05/2012   PLT 150 12/05/2012     Recent Labs Lab 12/04/12 2058 12/05/12 0348  NA 133* 134*  K 4.4 4.2  CL 97 98  CO2 27 26  BUN 61* 59*  CREATININE 1.86* 1.81*  CALCIUM 9.2 9.0  PROT 6.8  --   BILITOT 0.4  --   ALKPHOS 45  --   ALT 6  --   AST 14  --   GLUCOSE 185* 98   Lab Results  Component Value Date   TROPONINI <0.30 12/05/2012   Lab Results  Component Value Date   CHOL 157 12/05/2012   Lab Results  Component Value Date   HDL 36* 12/05/2012   Lab Results  Component Value Date   LDLCALC 66 12/05/2012   Lab Results  Component Value Date   TRIG 273* 12/05/2012   Lab Results  Component Value Date   CHOLHDL 4.4 12/05/2012     Radiology/Studies: Dg Chest Portable 1 View 12/04/2012   *RADIOLOGY REPORT*  Clinical Data: Nausea and irregular heart beat  PORTABLE CHEST - 1 VIEW  Comparison: 05/18/2011.  Findings: There is mild cardiac enlargement.  No pleural effusion or edema.  Basilar predominant interstitial coarsening is noted bilaterally.  No airspace consolidation.  IMPRESSION:  1.  No acute cardiopulmonary abnormalities. 2.  Chronic interstitial coarsening.   Original Report Authenticated By: Taylor Stroud, M.D.    EKG: 12-04-2012 sinus brady rate 57, RBBB, LAFB, QRS  156msec 12-05-2012 junctional escape with bigeminal PAC's, RBBB, LAFB  TELEMETRY: junctional rhythm in 50's with PAC's  ECHO: pending (has been requested to be done this morning)  Principal Problem:   Fall Active Problems:   PERIPHERAL EDEMA   Anemia   Sinoatrial node dysfunction   Chronic kidney disease (CKD), stage III (moderate)  The patient had a fall. Is not clear whether he was syncopal or not. He has chronic bifascicular block. He has no evidence of sinus node dysfunction. Still however, is not clear to me that any of this relates to his fall as he has an antecedent history of falls associated with changes in position and there has been a recent up titration of his diuretic associated with a 25 pound weight loss over the last couple of months. Furthermore, he is chronically anemic and his BUN is elevated. I don't know whether this relates to his diuretics or to GI blood loss  I am not sure that pacing is the answer at this juncture. I would rather implant a diagnostic monitor to help identify associations between his rhythm and any further symptoms.  Also undertake orthostatic vital signs and then tried to modulate hypotension if we see if as I suspect we will.    

## 2012-12-05 NOTE — Care Management Note (Signed)
    Page 1 of 1   12/05/2012     7:07:26 AM   CARE MANAGEMENT NOTE 12/05/2012  Patient:  Preston Carter, Preston Carter   Account Number:  [id number]  Date Initiated:  12/05/2012  Documentation initiated by:  Alvira Philips Assessment:   79 yr-old male adm with dx of bradycardia; lives with spouse     DC Planning Services  CM consult   Comments:  PCP: Dr Ivery Quale

## 2012-12-05 NOTE — H&P (View-Only) (Signed)
ELECTROPHYSIOLOGY CONSULT NOTE    Patient ID: Preston Carter MRN: [id number], DOB/AGE: 77-Feb-1933 77 y.o.  Admit date: 12/04/2012 Date of Consult: 12-05-2012  Primary Physician: Garlan Fillers, MD Primary Cardiologist: Sharyn Lull (new this admission)  Reason for Consultation: syncope  HPI:  Preston Carter is a 79 year old male with a past medical history significant for hypertension, stroke, diabetes, obesity, sleep apnea (on CPAP), and dementia.    On the day of admission, he was sitting outside in a swing under the shelter.  His grandson found him on the ground.  His wife called EMS when they were unable to move him back to the swing.  He had several episodes of vomiting once he regained consciousness.  His wife states that he had no prodrome and does not remember trying to get up or passing out.  On arrival to the ER, he was found to be bradycardic with bifascicular block.  He was admitted for further observation and testing.  Lab work has been remarkable for negative cardiac enzymes, Hgb 10.7, creat 1.81, BUN 59, sodium 134, normal TSH.  Echo is pending.   The patient's wife states that he has not complained of any chest pain or dizziness recently.  He does have chronic deconditioning and shortness of breath.  She states that he has had some periodic episodes of nausea but no vomiting.  He has not had any recent changes in medications.  The patient has a history of orthostatic falls. Of note, about 2 months ago, his Lasix was increased to twice daily and there has been a subsequent 25 pound weight loss. His wife has not noted particularly any worse dizziness with standing and her husband has not complained of that. It was her impression that from his physician in the ground that he had fallen after he tried to stand.  ROS is negative except as outlined above.  Past Medical History  Diagnosis Date  . Hypertension   . Stroke 05-03-11    TIA- more than 20 yrs ago.  . Asthma   .  Shortness of breath 05-03-11    SOB with exertion only  . Diabetes mellitus 05-03-11    Oral meds used-Dabetes x5 yrs-CBG's (145)  . Neuromuscular disorder 05-03-11    Dx. Parkinson's -essential tremors-generally, ambulates with walker  . Arthritis 05-03-11    most joints- Bil. hips/knees replaced in the past  . Skin cancer 05-03-11    Past hx. skin cancer lesions-removed  . DEMENTIA 05-03-11    hx. Alzheimers-tx. Aricept  . Irregular heart beat   . Anemia 05/14/2011     Surgical History:  Past Surgical History  Procedure Laterality Date  . Joint replacement  05-03-11    Bil. hip/knee repalcements-now RT. Hip to be revised 05-11-11  . Tonsillectomy    . Uvulopalatopharyngoplasty  05-03-11    x2 . Can.t tolerate cpap machine, doesn't use  . Cholecystectomy    . Eye surgery  05-03-11    lt. eye for lazy eye  . Carpal tunnel release  05-03-11    bilateral  . Total hip revision  05/11/2011    Procedure: TOTAL HIP REVISION;  Surgeon: Gus Rankin Aluisio;  Location: [facility];  Service: Orthopedics;  Laterality: Right;     Prescriptions prior to admission  Medication Sig Dispense Refill  . carbidopa-levodopa-entacapone (STALEVO) 25-100-200 MG per tablet Take 1 tablet by mouth 3 (three) times daily.       Marland Kitchen donepezil (ARICEPT) 5 MG tablet Take 5  mg by mouth at bedtime.       . furosemide (LASIX) 40 MG tablet Take 40 mg by mouth 2 (two) times daily.       Marland Kitchen glimepiride (AMARYL) 2 MG tablet Take 2 mg by mouth daily. Pt takes at 2 pm       . guaiFENesin-dextromethorphan (ROBITUSSIN DM) 100-10 MG/5ML syrup Take 5 mLs by mouth 3 (three) times daily as needed. cough      . ipratropium-albuterol (DUONEB) 0.5-2.5 (3) MG/3ML SOLN Take 3 mLs by nebulization every 4 (four) hours as needed. wheezing      . ketoconazole (NIZORAL) 2 % cream Apply 1 application topically daily as needed. itching      . loratadine (CLARITIN) 10 MG tablet Take 10 mg by mouth every morning.       . mometasone (NASONEX)  50 MCG/ACT nasal spray Place 2 sprays into the nose daily as needed. Allergies        . multivitamin-lutein (OCUVITE-LUTEIN) CAPS Take 1 capsule by mouth daily.        . niacin 500 MG tablet Take 500 mg by mouth 2 (two) times daily with a meal.        . olmesartan (BENICAR) 20 MG tablet Take 20 mg by mouth every morning.       . Olopatadine HCl (PATADAY) 0.2 % SOLN Place 1 drop into both eyes daily as needed. Dry eye      . OVER THE COUNTER MEDICATION Take 1 tablet by mouth 3 (three) times daily. Lithium Aspartate 5 mg       . potassium chloride SA (K-DUR,KLOR-CON) 20 MEQ tablet Take 20 mEq by mouth every morning.       . simvastatin (ZOCOR) 40 MG tablet Take 40 mg by mouth at bedtime.       . temazepam (RESTORIL) 30 MG capsule Take 30 mg by mouth at bedtime.       . traMADol (ULTRAM) 50 MG tablet Take 50 mg by mouth every 8 (eight) hours as needed. Pain        . triamterene-hydrochlorothiazide (MAXZIDE) 75-50 MG per tablet Take 1 tablet by mouth every morning.         Inpatient Medications:  . aspirin EC  81 mg Oral Daily  . carbidopa-levodopa  1 tablet Oral TID   And  . entacapone  200 mg Oral TID  . insulin aspart  0-9 Units Subcutaneous TID WC  . loratadine  10 mg Oral BH-q7a  . niacin  500 mg Oral BID WC  . pantoprazole  40 mg Oral Q0600  . potassium chloride SA  20 mEq Oral BH-q7a    Allergies: No Known Allergies  History   Social History  . Marital Status: Married    Spouse Name: N/A    Number of Children: N/A  . Years of Education: N/A   Social History Main Topics  . Smoking status: Former Smoker -- 1.50 packs/day for 0 years  . Smokeless tobacco: Not on file  . Alcohol Use: Not on file  . Drug Use: Not on file  . Sexually Active: No   No family history on file.   BP 105/51  Pulse 32  Temp(Src) 99.4 F (37.4 C) (Oral)  Resp 19  Ht 5' 6.14" (1.68 m)  Wt 288 lb 5.8 oz (130.8 kg)  BMI 46.34 kg/m2  SpO2 98% Alert but sleepy morbidly obese in no acute  distress HENT- normal Eyes- EOMI, without scleral icterus Skin- warm and  dry; erythmema over the shins bilaterally with bandages LN-neg Neck- supple without thyromegaly, JVP-flat, carotids brisk and full without bruits Back-without CVAT or kyphosis Lungs-clear to auscultation CV-Irregular rate and rhythm, nl S1 and S2, no murmurs gallops or rubs,  Abd-soft with active bowel sounds; no midline pulsation or hepatomegaly Pulses-intact femoral and distal MKS-without gross deformity Neuro- Ax O, CN3-12 intact, grossly normal motor and sensory function Affect engaging    Labs:   Lab Results  Component Value Date   WBC 9.0 12/05/2012   HGB 10.7* 12/05/2012   HCT 30.7* 12/05/2012   MCV 90.6 12/05/2012   PLT 150 12/05/2012     Recent Labs Lab 12/04/12 2058 12/05/12 0348  NA 133* 134*  K 4.4 4.2  CL 97 98  CO2 27 26  BUN 61* 59*  CREATININE 1.86* 1.81*  CALCIUM 9.2 9.0  PROT 6.8  --   BILITOT 0.4  --   ALKPHOS 45  --   ALT 6  --   AST 14  --   GLUCOSE 185* 98   Lab Results  Component Value Date   TROPONINI <0.30 12/05/2012   Lab Results  Component Value Date   CHOL 157 12/05/2012   Lab Results  Component Value Date   HDL 36* 12/05/2012   Lab Results  Component Value Date   LDLCALC 66 12/05/2012   Lab Results  Component Value Date   TRIG 273* 12/05/2012   Lab Results  Component Value Date   CHOLHDL 4.4 12/05/2012     Radiology/Studies: Dg Chest Portable 1 View 12/04/2012   *RADIOLOGY REPORT*  Clinical Data: Nausea and irregular heart beat  PORTABLE CHEST - 1 VIEW  Comparison: 05/18/2011.  Findings: There is mild cardiac enlargement.  No pleural effusion or edema.  Basilar predominant interstitial coarsening is noted bilaterally.  No airspace consolidation.  IMPRESSION:  1.  No acute cardiopulmonary abnormalities. 2.  Chronic interstitial coarsening.   Original Report Authenticated By: Signa Kell, M.D.    EKG: 12-04-2012 sinus brady rate 57, RBBB, LAFB, QRS  12-05-2012 junctional escape with bigeminal PAC's, RBBB, LAFB  TELEMETRY: junctional rhythm in 50's with PAC's  ECHO: pending (has been requested to be done this morning)  Principal Problem:   Fall Active Problems:   PERIPHERAL EDEMA   Anemia   Sinoatrial node dysfunction   Chronic kidney disease (CKD), stage III (moderate)  The patient had a fall. Is not clear whether he was syncopal or not. He has chronic bifascicular block. He has no evidence of sinus node dysfunction. Still however, is not clear to me that any of this relates to his fall as he has an antecedent history of falls associated with changes in position and there has been a recent up titration of his diuretic associated with a 25 pound weight loss over the last couple of months. Furthermore, he is chronically anemic and his BUN is elevated. I don't know whether this relates to his diuretics or to GI blood loss  I am not sure that pacing is the answer at this juncture. I would rather implant a diagnostic monitor to help identify associations between his rhythm and any further symptoms.  Also undertake orthostatic vital signs and then tried to modulate hypotension if we see if as I suspect we will.

## 2012-12-05 NOTE — CV Procedure (Signed)
Pre op Dx  Fall and bifascicular block Post op Dx same  Procedure  Loop Recorder implantation  After routine prep and drape of the left parasternal area, a small incision was created. A [employer] LINQ Reveal Loop Recorder  Serial Number  [id number] was inserted.    Steri-Strips were applied.  The patient tolerated the procedure without apparent complication.

## 2012-12-06 DIAGNOSIS — D649 Anemia, unspecified: Secondary | ICD-10-CM

## 2012-12-06 DIAGNOSIS — R609 Edema, unspecified: Secondary | ICD-10-CM

## 2012-12-06 LAB — CBC
MCH: 31.9 pg (ref 26.0–34.0)
Platelets: 135 10*3/uL — ABNORMAL LOW (ref 150–400)
RBC: 3.26 MIL/uL — ABNORMAL LOW (ref 4.22–5.81)
WBC: 8.1 10*3/uL (ref 4.0–10.5)

## 2012-12-06 LAB — GLUCOSE, CAPILLARY
Glucose-Capillary: 136 mg/dL — ABNORMAL HIGH (ref 70–99)
Glucose-Capillary: 213 mg/dL — ABNORMAL HIGH (ref 70–99)

## 2012-12-06 MED ORDER — APIXABAN 5 MG PO TABS
5.0000 mg | ORAL_TABLET | Freq: Two times a day (BID) | ORAL | Status: DC
  Administered 2012-12-06 – 2012-12-07 (×2): 5 mg via ORAL
  Filled 2012-12-06 (×3): qty 1

## 2012-12-06 NOTE — Progress Notes (Signed)
ELECTROPHYSIOLOGY ROUNDING NOTE    Patient Name: Preston Carter Date of Encounter: 12-06-2012    SUBJECTIVE:Patient without complaint this morning "I can't tell you what all they've done to me".  ILR implanted yesterday to evaluate falls  TELEMETRY: Reviewed telemetry pt in sinus rhythm with intermittent junctional rhythm; sinus pauses of up to 5 seconds (starting yesterday at 7PM- pt is unsure if he was awake at this time) most of the pauses in the middle of the night and assoc with sinus arrest as opposed to sinus slowing Filed Vitals:   12/05/12 1815 12/05/12 1900 12/05/12 2300 12/06/12 0400  BP:  118/69    Pulse: 90 78    Temp:   98.5 F (36.9 C) 98.2 F (36.8 C)  TempSrc:   Oral Oral  Resp: 21 21    Height:      Weight:      SpO2: 100% 100%      Well developed and morbidly obese in no acute distress HENT normal Neck supple   Clear laterally  Regular rate and rhythm, no murmurs or gallops Abd-soft with active BS No Clubbing cyanosis 1+ edema Skin-warm and dry; lower extremity erythema A moves all 4 extremities, head tremor ;      Intake/Output Summary (Last 24 hours) at 12/06/12 0620 Last data filed at 12/05/12 1800  Gross per 24 hour  Intake    509 ml  Output    400 ml  Net    109 ml    LABS: Basic Metabolic Panel:  Recent Labs  40/98/11 2058 12/05/12 0348  NA 133* 134*  K 4.4 4.2  CL 97 98  CO2 27 26  GLUCOSE 185* 98  BUN 61* 59*  CREATININE 1.86* 1.81*  CALCIUM 9.2 9.0   Liver Function Tests:  Recent Labs  12/04/12 2058  AST 14  ALT 6  ALKPHOS 45  BILITOT 0.4  PROT 6.8  ALBUMIN 3.5  CBC:  Recent Labs  12/04/12 1800 12/04/12 2058 12/05/12 0348 12/06/12 0440  WBC 10.3 8.4 9.0 8.1  NEUTROABS 7.0 6.2  --   --   HGB 11.5* 10.7* 10.7* 10.4*  HCT 32.9* 30.6* 30.7* 29.7*  MCV 91.4 90.8 90.6 91.1  PLT 162 142* 150 135*   Cardiac Enzymes:  Recent Labs  12/04/12 2058 12/05/12 0348 12/05/12 0853  TROPONINI <0.30 <0.30 <0.30     Hemoglobin A1C:  Recent Labs  12/04/12 2058  HGBA1C 5.9*   Fasting Lipid Panel:  Recent Labs  12/05/12 0348  CHOL 157  HDL 36*  LDLCALC 66  TRIG 914*  CHOLHDL 4.4   Thyroid Function Tests:  Recent Labs  12/04/12 2058  TSH 2.212    Radiology/Studies:  Dg Chest Portable 1 View 12/04/2012   *RADIOLOGY REPORT*  Clinical Data: Nausea and irregular heart beat  PORTABLE CHEST - 1 VIEW  Comparison: 05/18/2011.  Findings: There is mild cardiac enlargement.  No pleural effusion or edema.  Basilar predominant interstitial coarsening is noted bilaterally.  No airspace consolidation.  IMPRESSION:  1.  No acute cardiopulmonary abnormalities. 2.  Chronic interstitial coarsening.   Original Report Authenticated By: Signa Kell, M.D.      ECHO: EF 50-55%, no RWMA   Principal Problem:   Fall Active Problems:   PERIPHERAL EDEMA   Anemia   Sinoatrial node dysfunction   Chronic kidney disease (CKD), stage III (moderate)  Will observe from a distance  We would await daytime pauses or documented bradycardia that correlated with symptoms  Nocturnal or sleep assoc pauses are not an indication for pacing but do correlate with his diagnosis of significant OSA for which he use CPAP at home

## 2012-12-06 NOTE — ED Provider Notes (Signed)
I have personally seen and examined the patient.  I have discussed the plan of care with the resident.  I have reviewed the documentation on PMH/FH/Soc. History.  I have reviewed the documentation of the resident and agree.  I have reviewed and agree with the ECG interpretation(s) documented by the resident.  Pt back to baseline on my evaluation.  He had no complaints, no signs of trauma to head/neck/extremities.  However given h/o syncope advised admission in the setting of multiple medical problems Patient/family agreeable  Joya Gaskins, MD 12/06/12 [phone]

## 2012-12-06 NOTE — Progress Notes (Signed)
Subjective:  Appreciate EP consult and help. Patient underwent ILR implantation yesterday and tolerated procedure well Patient denies any chest pain or shortness of breath lying comfortably in bed. Denies any dizziness lightheadedness or further syncopal episode. Patient had significant pauses while sleeping with junctional escape rhythm. Now sinus bradycardia heart rate in 40s to low 50s on the monitor  Objective:  Vital Signs in the last 24 hours: Temp:  [97.8 F (36.6 C)-98.8 F (37.1 C)] 97.8 F (36.6 C) (06/19 0800) Pulse Rate:  [31-90] 49 (06/19 0700) Resp:  [15-22] 16 (06/19 0700) BP: (97-159)/(28-124) 128/48 mmHg (06/19 0800) SpO2:  [48 %-100 %] 100 % (06/19 0700)  Intake/Output from previous day: 06/18 0701 - 06/19 0700 In: 509 [P.O.:460; I.V.:49] Out: 902 [Urine:900; Stool:2] Intake/Output from this shift:    Physical Exam: Neck: no adenopathy, no carotid bruit, no JVD and supple, symmetrical, trachea midline Lungs: Decreased breath sound at bases Heart: regular rate and rhythm, S1, S2 normal, no murmur, click, rub or gallop Abdomen: soft, non-tender; bowel sounds normal; no masses,  no organomegaly Extremities: No clubbing cyanosis trace edema noted  Lab Results:  Recent Labs  12/05/12 0348 12/06/12 0440  WBC 9.0 8.1  HGB 10.7* 10.4*  PLT 150 135*    Recent Labs  12/04/12 2058 12/05/12 0348  NA 133* 134*  K 4.4 4.2  CL 97 98  CO2 27 26  GLUCOSE 185* 98  BUN 61* 59*  CREATININE 1.86* 1.81*    Recent Labs  12/05/12 0348 12/05/12 0853  TROPONINI <0.30 <0.30   Hepatic Function Panel  Recent Labs  12/04/12 2058  PROT 6.8  ALBUMIN 3.5  AST 14  ALT 6  ALKPHOS 45  BILITOT 0.4    Recent Labs  12/05/12 0348  CHOL 157   No results found for this basename: PROTIME,  in the last 72 hours  Imaging: Imaging results have been reviewed and Dg Chest Portable 1 View  12/04/2012   *RADIOLOGY REPORT*  Clinical Data: Nausea and irregular heart  beat  PORTABLE CHEST - 1 VIEW  Comparison: 05/18/2011.  Findings: There is mild cardiac enlargement.  No pleural effusion or edema.  Basilar predominant interstitial coarsening is noted bilaterally.  No airspace consolidation.  IMPRESSION:  1.  No acute cardiopulmonary abnormalities. 2.  Chronic interstitial coarsening.   Original Report Authenticated By: Signa Kell, M.D.    Cardiac Studies:  Assessment/Plan:  Status post symptomatic bradycardia/junctional escape rhythm status post ILR. Status post fall  Probably sick sinus syndrome Status post syncope  Hypertension  Diabetes matters  History of CVA in the past  Obstructive sleep apnea/obesity hypoventilation syndrome  Morbid obesity  Mr. Darryll Capers disease  Dementia  Chronic kidney disease stage IV improved  Degenerative joint disease  History of remote tobacco abuse Plan Continue present management OT PT consult   LOS: 2 days    Aris Moman N 12/06/2012, 9:07 AM

## 2012-12-06 NOTE — Progress Notes (Signed)
ANTICOAGULATION CONSULT NOTE - Follow Up Consult  Pharmacy Consult for Apixaban Indication: atrial fibrillation  No Known Allergies  Patient Measurements: Height: 5' 6.14" (168 cm) Weight: 288 lb 5.8 oz (130.8 kg) IBW/kg (Calculated) : 64.13  Vital Signs: Temp: 98.3 F (36.8 C) (06/19 1145) Temp src: Oral (06/19 1145) BP: 108/40 mmHg (06/19 1200) Pulse Rate: 83 (06/19 1200)  Labs:  Recent Labs  12/04/12 1800 12/04/12 2058 12/05/12 0348 12/05/12 0853 12/06/12 0440  HGB 11.5* 10.7* 10.7*  --  10.4*  HCT 32.9* 30.6* 30.7*  --  29.7*  PLT 162 142* 150  --  135*  APTT  --  32  --   --   --   LABPROT  --  13.0  --   --   --   INR  --  0.99  --   --   --   CREATININE 1.93* 1.86* 1.81*  --   --   TROPONINI  --  <0.30 <0.30 <0.30  --     Estimated Creatinine Clearance: 42.5 ml/min (by C-G formula based on Cr of 1.81).   Medications:  Prescriptions prior to admission  Medication Sig Dispense Refill  . carbidopa-levodopa-entacapone (STALEVO) 25-100-200 MG per tablet Take 1 tablet by mouth 3 (three) times daily.       Marland Kitchen donepezil (ARICEPT) 5 MG tablet Take 5 mg by mouth at bedtime.       . furosemide (LASIX) 40 MG tablet Take 40 mg by mouth 2 (two) times daily.       Marland Kitchen glimepiride (AMARYL) 2 MG tablet Take 2 mg by mouth daily. Pt takes at 2 pm       . guaiFENesin-dextromethorphan (ROBITUSSIN DM) 100-10 MG/5ML syrup Take 5 mLs by mouth 3 (three) times daily as needed. cough      . ipratropium-albuterol (DUONEB) 0.5-2.5 (3) MG/3ML SOLN Take 3 mLs by nebulization every 4 (four) hours as needed. wheezing      . ketoconazole (NIZORAL) 2 % cream Apply 1 application topically daily as needed. itching      . loratadine (CLARITIN) 10 MG tablet Take 10 mg by mouth every morning.       . mometasone (NASONEX) 50 MCG/ACT nasal spray Place 2 sprays into the nose daily as needed. Allergies        . multivitamin-lutein (OCUVITE-LUTEIN) CAPS Take 1 capsule by mouth daily.        . niacin  500 MG tablet Take 500 mg by mouth 2 (two) times daily with a meal.        . olmesartan (BENICAR) 20 MG tablet Take 20 mg by mouth every morning.       . Olopatadine HCl (PATADAY) 0.2 % SOLN Place 1 drop into both eyes daily as needed. Dry eye      . OVER THE COUNTER MEDICATION Take 1 tablet by mouth 3 (three) times daily. Lithium Aspartate 5 mg       . potassium chloride SA (K-DUR,KLOR-CON) 20 MEQ tablet Take 20 mEq by mouth every morning.       . simvastatin (ZOCOR) 40 MG tablet Take 40 mg by mouth at bedtime.       . temazepam (RESTORIL) 30 MG capsule Take 30 mg by mouth at bedtime.       . traMADol (ULTRAM) 50 MG tablet Take 50 mg by mouth every 8 (eight) hours as needed. Pain        . triamterene-hydrochlorothiazide (MAXZIDE) 75-50 MG per tablet Take 1 tablet  by mouth every morning.         Assessment: CC: 79 yo M admitted 12/04/2012 with syncope. Pharmacy consulted to dose apixaban.  Events: 24h post loop recorder implant, following for significant events  PMH: HTN, CVA, asthma, DM, Parkinsons, OA, skin ca, Alzheimers, CKD, afib  AC: AFib,  (CHADS2 score = 4), not anticoagulated PTA, IV heparin was started prior to EP procedure, spoke with Dr. Graciela Husbands will initiate apixaban 5 mg PO bid.  ID: Afebrile, WBC WNL, no abx  CV: Hx HTN, afib - ASA, niacin,K 20 daily  (PTA lasix, olmesartan, simva, maxzide), VSS  Endo: Hx DM - CBGs ok on SSI (PTA glimepiride)  Neuro: Hx Alzheimers, Parkinsons, CVA - sinemet, entacapone (PTA donepezil)   Renal: Hx CKD - SCr 1.81 (slight trend down) Pulm: Hx asthma - 98% 2L [place]  Best practices: PPI  Goal of Therapy:  Anti Xa inhibition Monitor platelets by anticoagulation protocol: Yes   Plan:  Apixaban 5 mg PO BID Follow periodic CBC, note patient is 6 months from requiring dose reduction to 2.5 mg PO bid, consider in outpatient follow up.   Thank you for allowing pharmacy to be a part of this patients care team.  Lovenia Kim Pharm.D.,  BCPS Clinical Pharmacist 12/06/2012 2:26 PM Pager: [phone] Phone: [phone]

## 2012-12-06 NOTE — Evaluation (Signed)
Physical Therapy Evaluation Patient Details Name: Preston Carter MRN: [id number] DOB: December 30, 1931 Today's Date: 12/06/2012 Time: 4098-1191 PT Time Calculation (min): 26 min  PT Assessment / Plan / Recommendation Clinical Impression  pt admitted with suspected syncopal episode while out swing on his outdoor swing.  A monitor implanted to  track cardiac events.  On eval, pt shows weakness, decr activity tolerance and gait instabilities.  Pt can benefit from Pt to maximize function and independence.    PT Assessment  Patient needs continued PT services    Follow Up Recommendations  Home health PT;Supervision for mobility/OOB    Does the patient have the potential to tolerate intense rehabilitation      Barriers to Discharge        Equipment Recommendations  None recommended by PT    Recommendations for Other Services     Frequency Min 3X/week    Precautions / Restrictions Precautions Precautions: Fall Restrictions Weight Bearing Restrictions: No   Pertinent Vitals/Pain Sats on RA during gait were in mid 90's range.  O2 reapplied.      Mobility  Bed Mobility Bed Mobility: Not assessed Transfers Transfers: Sit to Stand;Stand to Sit Sit to Stand: 4: Min assist;From elevated surface;From chair/3-in-1 Stand to Sit: 4: Min assist;With upper extremity assist;To chair/3-in-1 Details for Transfer Assistance: vc's for hand placement and mild lifting asssit Ambulation/Gait Ambulation/Gait Assistance: 4: Min assist Ambulation Distance (Feet): 35 Feet Assistive device: Rolling walker Ambulation/Gait Assistance Details: tends to stay much too far from the RW and consistently stays flexed forward.  short step length with slow cadence. Gait Pattern: Step-through pattern;Trunk flexed Stairs: No    Exercises     PT Diagnosis: Difficulty walking;Generalized weakness  PT Problem List: Decreased strength;Decreased activity tolerance;Decreased mobility;Decreased knowledge of use of  DME;Cardiopulmonary status limiting activity PT Treatment Interventions: DME instruction;Gait training;Functional mobility training;Therapeutic activities;Patient/family education   PT Goals Acute Rehab PT Goals PT Goal Formulation: With patient/family Time For Goal Achievement: 12/20/12 Potential to Achieve Goals: Fair Pt will go Supine/Side to Sit: with modified independence PT Goal: Supine/Side to Sit - Progress: Goal set today Pt will go Sit to Stand: with modified independence PT Goal: Sit to Stand - Progress: Goal set today Pt will Transfer Bed to Chair/Chair to Bed: with modified independence PT Transfer Goal: Bed to Chair/Chair to Bed - Progress: Goal set today Pt will Ambulate: 51 - 150 feet;with supervision;with least restrictive assistive device;with rolling walker PT Goal: Ambulate - Progress: Goal set today  Visit Information  Last PT Received On: 12/06/12 Assistance Needed: +1    Subjective Data  Subjective: This is how I walk.Marland KitchenMarland KitchenMarland KitchenI usually walk with my elbows on the RW Patient Stated Goal: Home as soon as possible   Prior Functioning  Home Living Lives With: Spouse;Other (Comment) (grandson) Available Help at Discharge: Family;Available 24 hours/day Type of Home: House Home Access: Stairs to enter;Ramped entrance Entrance Stairs-Number of Steps: 1 Entrance Stairs-Rails: None Home Layout: One level Bathroom Shower/Tub: Walk-in Contractor: Standard Home Adaptive Equipment: Bedside commode/3-in-1;Hand-held shower hose;Shower chair with back;Walker - rolling;Wheelchair - powered (lift chair) Prior Function Level of Independence: Independent with assistive device(s) Able to Take Stairs?: Yes Driving: No Communication Communication: No difficulties Dominant Hand: Right    Cognition  Cognition Arousal/Alertness: Awake/alert Behavior During Therapy: WFL for tasks assessed/performed Overall Cognitive Status: Within Functional Limits for tasks  assessed (appears to be covering mild confusion with humor)    Extremity/Trunk Assessment Right Upper Extremity Assessment RUE ROM/Strength/Tone: Within functional  levels (mild weakness bil and weaker grips) Left Upper Extremity Assessment LUE ROM/Strength/Tone: WFL for tasks assessed Right Lower Extremity Assessment RLE ROM/Strength/Tone: WFL for tasks assessed (bil proximal greater than distal weaknesses) Left Lower Extremity Assessment LLE ROM/Strength/Tone: WFL for tasks assessed (see R LE) Trunk Assessment Trunk Assessment: Other exceptions (tends to stay flexed esp using RW)   Balance Balance Balance Assessed: Yes Static Sitting Balance Static Sitting - Balance Support: Feet supported;Right upper extremity supported;Left upper extremity supported Static Sitting - Level of Assistance: Other (comment);5: Stand by assistance Static Standing Balance Static Standing - Balance Support: During functional activity;Bilateral upper extremity supported Static Standing - Level of Assistance: Other (comment) (min guard)  End of Session PT - End of Session Activity Tolerance: Patient tolerated treatment well Patient left: in chair;with call bell/phone within reach;with family/visitor present Nurse Communication: Mobility status  GP     Helena Sardo, Eliseo Gum 12/06/2012, 4:23 PM  12/06/2012  Quinby Bing, PT [phone]  (pager)

## 2012-12-07 LAB — CBC
HCT: 29.4 % — ABNORMAL LOW (ref 39.0–52.0)
MCHC: 35.4 g/dL (ref 30.0–36.0)
MCV: 90.2 fL (ref 78.0–100.0)
RDW: 14.1 % (ref 11.5–15.5)

## 2012-12-07 LAB — GLUCOSE, CAPILLARY: Glucose-Capillary: 144 mg/dL — ABNORMAL HIGH (ref 70–99)

## 2012-12-07 MED ORDER — ASPIRIN 81 MG PO TBEC: 81.0000 mg | DELAYED_RELEASE_TABLET | Freq: Every day | ORAL | Status: DC

## 2012-12-07 NOTE — Discharge Summary (Signed)
NAMEDAMYAN, CORNE NO.:  [id number]  MEDICAL RECORD NO.:  [id number]  LOCATION:  2926                         FACILITY:  [facility]  PHYSICIAN:  Eduardo Osier. Sharyn Lull, M.D. DATE OF BIRTH:  04-02-1932  DATE OF ADMISSION:  12/04/2012 DATE OF DISCHARGE:  12/07/2012                              DISCHARGE SUMMARY   ADMITTING DIAGNOSES: 1. Status post symptomatic bradycardia. 2. Status post syncope. 3. Hypertension. 4. Diabetes mellitus. 5. History of cerebrovascular accident/transient ischemic attack in     the past. 6. Obstructive sleep apnea/obesity-hypoventilation syndrome. 7. Morbid obesity. 8. History of Parkinson disease. 9. Dementia. 10.Chronic kidney disease, stage IV. 11.Degenerative joint disease. 12.Remote tobacco abuse.  DISCHARGE DIAGNOSES: 1. Status post questionable syncope status post symptomatic     bradycardia status post implantation of diagnostic loop recorder. 2. Hypertension. 3. Diabetes mellitus. 4. History of cerebrovascular accident/transient ischemic attack. 5. Marked sinus bradycardia with occasional junctional escape rhythm. 6. Obstructive sleep apnea/obesity-hypoventilation syndrome. 7. Morbid obesity. 8. History of Parkinson disease. 9. Dementia. 10.Chronic kidney disease, stage IV. 11.Anemia of chronic disease. 12.Degenerative joint disease. 13.Remote tobacco abuse.  DISCHARGE HOME MEDICATIONS:  Enteric-coated aspirin 81 mg 1 tablet daily.  Patient has been advised to continue his home medications, i.e., carbidopa/levodopa, entacapone 1 tablet 3 times daily, furosemide 40 mg twice daily, Amaryl 2 mg daily, Robitussin DM 3 times daily as needed, DuoNeb every 4 hours for wheezing as before, ketoconazole 2% cream apply locally as before, Claritin 10 mg daily, multivitamin 1 capsule daily, Nasonex 2 sprays in each nostril daily as before, niacin 500 mg 2 tablets 2 times daily with meals, Benicar 20 mg 1 tablet every day, Pataday  0.2% solution in both eyes as before, potassium chloride 20 mEq daily, simvastatin 40 mg 1 tablet daily, temazepam 30 mg by mouth at bedtime as before, tramadol 50 mg every 8 hours as needed for pain.  The patient has been advised to stopped Aricept for now.  Stop Maxzide.  DIET:  Low salt, low cholesterol.  ACTIVITY:  Increase activity slowly as tolerated.  FOLLOW UP:  Follow up with me in 1 week and Dr. Graciela Husbands in 1-2 weeks.  CONDITION AT DISCHARGE:  Stable.  BRIEF HISTORY AND HOSPITAL COURSE:  Mr. Flinchum is a 79 year old male with past medical history significant for hypertension, diabetes mellitus, history of CVA/TIA in the past, morbid obesity, obstructive sleep apnea/obesity-hypoventilation syndrome, history of Parkinson disease, history of dementia, chronic kidney disease, anemia of chronic disease, degenerative joint disease, remote tobacco abuse, came to the ER via EMS following syncopal episode as per the patient's wife.  As per patient's wife, as patient was still in the swing outside, he suddenly became unresponsive.  The patient does not recall any events prior to this episode.  The patient denies any chest pain, nausea, vomiting, diaphoresis.  Denies palpitation, lightheadedness, or such episodes in the past.  EKG done in the ER showed marked sinus bradycardia with bifascicular block.  Heart rate in the 50s.  At present, the patient denies any dizziness or lightheadedness.  PAST MEDICAL HISTORY:  As above.  PHYSICAL EXAMINATION:  GENERAL:  He was alert, awake, oriented x3. VITAL SIGNS:  Blood pressure was 110/56, pulse was in 50s.  He was afebrile. HEENT:  Conjunctiva was pink. NECK:  Supple.  No JVD.  No bruit. LUNGS:  Decreased breath sounds at bases. CARDIOVASCULAR:  S1, S2 was normal.  There was soft systolic murmur.  No S3, gallop noted. ABDOMEN:  Soft, obese, distended and nontender. EXTREMITIES:  There is no clubbing, cyanosis.  There was trace  edema.  LABORATORY DATA:  Sodium was 134, potassium 4.2, BUN 58, creatinine 2.20.  Repeat electrolytes yesterday:  Sodium 134, potassium 4.2, BUN 59, creatinine 1.82.  His four sets of troponin-I were negative. Cholesterol was 157, triglycerides were 273, HDL 36, LDL 66.  Hemoglobin was 11.5, hematocrit 32.9, white count of 10.3.  Repeat hemoglobin today is 10.4, hematocrit 29.4, white count of 9.8, which has been stable for last 2 days.  EKG showed sinus bradycardia with right bundle-branch block and left anterior fascicular block.  Chest x-ray showed no acute cardiopulmonary abnormalities, chronic interstitial coursing.  BRIEF HOSPITAL COURSE:  Patient was admitted to step-down unit.  EP consultation was obtained for possible pacemaker implantation.  The patient and family decided to proceed with implantable loop recorder initially to evaluate whether his symptoms correlate with near syncopal episode.  The patient remains sinus bradycardic with occasional junctional escape rhythm and had approximately 4.2 second pause while asleep, but he was asymptomatic.  Patient is alert, awake.  OT/PT consultation was called.  Patient ambulated in the room.  The patient did not have any further significant episodes of pauses during the hospital stay.  The patient will be discharged home on above medications.  His Maxzide has been discontinued.  The patient will continue rest of his home medications.  He will follow up with me in 1 week, his primary doctor as scheduled, and Dr. Clide Cliff in 1-2 weeks.     Eduardo Osier. Sharyn Lull, M.D.     [facility]  D:  12/07/2012  T:  12/07/2012  Job:  [id number]

## 2012-12-07 NOTE — Progress Notes (Signed)
Discharged home accompanied by wife, stable. Discharge instructions given to wife. Belongings with pt.

## 2012-12-07 NOTE — Discharge Summary (Signed)
  Discharge summary dictated on 12/07/2012 dictation number is [id number]

## 2012-12-12 ENCOUNTER — Encounter: Payer: Self-pay | Admitting: Internal Medicine

## 2012-12-12 ENCOUNTER — Other Ambulatory Visit: Payer: Self-pay | Admitting: Dermatology

## 2012-12-24 ENCOUNTER — Encounter: Payer: Self-pay | Admitting: Internal Medicine

## 2012-12-24 ENCOUNTER — Ambulatory Visit (INDEPENDENT_AMBULATORY_CARE_PROVIDER_SITE_OTHER): Payer: Medicare Other | Admitting: *Deleted

## 2012-12-24 DIAGNOSIS — R55 Syncope and collapse: Secondary | ICD-10-CM

## 2012-12-24 LAB — PACEMAKER DEVICE OBSERVATION

## 2012-12-24 NOTE — Progress Notes (Signed)
Wound check ILR in office. 

## 2013-01-01 ENCOUNTER — Ambulatory Visit (INDEPENDENT_AMBULATORY_CARE_PROVIDER_SITE_OTHER): Payer: Medicare Other

## 2013-01-01 DIAGNOSIS — J309 Allergic rhinitis, unspecified: Secondary | ICD-10-CM

## 2013-01-11 ENCOUNTER — Encounter: Payer: Self-pay | Admitting: Internal Medicine

## 2013-01-14 ENCOUNTER — Telehealth: Payer: Self-pay | Admitting: Internal Medicine

## 2013-01-14 ENCOUNTER — Ambulatory Visit (INDEPENDENT_AMBULATORY_CARE_PROVIDER_SITE_OTHER): Payer: Medicare Other | Admitting: Internal Medicine

## 2013-01-14 ENCOUNTER — Encounter: Payer: Self-pay | Admitting: Internal Medicine

## 2013-01-14 VITALS — BP 118/50 | HR 61 | Ht 66.0 in | Wt 305.1 lb

## 2013-01-14 DIAGNOSIS — E669 Obesity, unspecified: Secondary | ICD-10-CM

## 2013-01-14 DIAGNOSIS — G4733 Obstructive sleep apnea (adult) (pediatric): Secondary | ICD-10-CM

## 2013-01-14 DIAGNOSIS — J309 Allergic rhinitis, unspecified: Secondary | ICD-10-CM

## 2013-01-14 DIAGNOSIS — J45909 Unspecified asthma, uncomplicated: Secondary | ICD-10-CM

## 2013-01-14 DIAGNOSIS — J302 Other seasonal allergic rhinitis: Secondary | ICD-10-CM

## 2013-01-14 DIAGNOSIS — J45998 Other asthma: Secondary | ICD-10-CM

## 2013-01-14 DIAGNOSIS — R609 Edema, unspecified: Secondary | ICD-10-CM

## 2013-01-14 NOTE — Patient Instructions (Addendum)
Please call Dr Eloise Harman about your red swollen legs, since he is treating them already. You need to keep your legs elevated when you are sitting. Walk more to lose weight and to help your legs.

## 2013-01-14 NOTE — Progress Notes (Signed)
01/13/12- 80 yoM former smoker followed for OSA/ sp UPPP, allergic rhinitis, asthma LOV   04/23/2010         Wife here  Patient does not use a cpap machine anymore. States breathing is good  has trouble falling asleep ant night . States once he is asleep he sleeps ok without waking up.  He missed his last appointment was of hip replacement with no respiratory complications. He says he couldn't keep CPAP on during sleep so he finally gave it back. He and his wife say he is better since his UPPP surgery and she says she only snores "a little bit". Benadryl sometimes helps sleep consolidation. He continues allergy vaccine. Rarely needs an antihistamine. Has a home nebulizer which is rarely used and he  does not use rescue inhaler anymore at all.  01/14/13- 81 yoM former smoker followed for OSA/ sp UPPP, allergic rhinitis, asthma LOV   04/23/2010         Wife here  FOLLOWS FOR: pt wearing o2 2L/ [employer] at night x6-8 wks- pt states he is fatigued and tired thorughout the day causing him to not be able to do much activity. OSA- s/p UPPP, failed CPAP. Stalevo Parkinson's drug may be causing somnolence. Wife says he is not snoring much. He sleeps in a recliner. He can't get in and out of bed. Wife can't get him to exercise. He continues allergy vaccine 1:10/wife with no problems. They both say that helps him still, compared to baseline. Leg edema has been worse despite taking 40 mg of Lasix twice daily and he says urine output has decreased. CXR 1v 12/04/12 IMPRESSION:  1. No acute cardiopulmonary abnormalities.  2. Chronic interstitial coarsening.  Original Report Authenticated By: Signa Kell, M.D.  ROS-see HPI Constitutional:   No-   weight loss, night sweats, fevers, chills,+ fatigue, lassitude. HEENT:   No-  headaches, difficulty swallowing, tooth/dental problems, sore throat,       No-  sneezing, itching, ear ache, nasal congestion, post nasal drip,  CV:  No-   chest pain, orthopnea, PND,  swelling in lower extremities, anasarca,  dizziness, palpitations Resp: + shortness of breath with exertion or at rest.              No-   productive cough,  No non-productive cough,  No- coughing up of blood.              No-   change in color of mucus.  No- wheezing.   Skin: No-   rash or lesions. GI:  No-   heartburn, indigestion, abdominal pain, nausea, vomiting,  GU: . MS:  No-   joint pain or swelling.   Neuro-     nothing unusual Psych:  No- change in mood or affect. No depression or anxiety.  No memory loss.  OBJ- Physical Exam General- Alert, Oriented, Affect-appropriate, Distress- none acute, obese Skin- rash-none, lesions- none, excoriation- none Lymphadenopathy- none Head- atraumatic            Eyes- Gross vision intact, PERRLA, conjunctivae and secretions clear            Ears- Hearing, canals-normal            Nose- Clear, no-Septal dev, +mucus bridging, polyps, erosion, perforation             Throat- Mallampati II , mucosa clear , drainage- none, tonsils- atrophic. +throat clearing Neck- flexible , trachea midline, no stridor , thyroid nl, carotid no bruit Chest -  symmetrical excursion , unlabored           Heart/CV- RRR , no murmur , no gallop  , no rub, nl s1 s2                           - JVD- none , edema+3, stasis changes+red stasis dermatitis, varices- none           Lung- clear to P&A, wheeze- none, cough- none , dullness-none, rub- none           Chest wall-  Abd-  Br/ Gen/ Rectal- Not done, not indicated Extrem- cyanosis- none, clubbing, none, atrophy- none, strength- nl. Using a walker Neuro- head bob tremor, pill rolling tremor

## 2013-01-14 NOTE — Telephone Encounter (Signed)
Loop transmission received and reviewed, the pt has had 420 significant pauses since 12-05-12. Left message for pt to call, he will need to see dr taylor tomorrow to discuss.

## 2013-01-14 NOTE — Telephone Encounter (Signed)
Spoke with pt wife, while the pt was in the shower on Friday he became very weak. He did not lose conscientiousness. He was unable to move and just had to sit down in the shower. He did not activate the loop. The pt has been fine since the episode. Will have pt send over a recording from his loop. Device nurses aware and will watch for transmission. Pt wife voiced understanding.

## 2013-01-14 NOTE — Telephone Encounter (Signed)
New Prob  Pt wife states that husband had a weak spell Friday night while in the shower.

## 2013-01-15 ENCOUNTER — Encounter: Payer: Self-pay | Admitting: Internal Medicine

## 2013-01-15 ENCOUNTER — Ambulatory Visit (INDEPENDENT_AMBULATORY_CARE_PROVIDER_SITE_OTHER): Payer: Medicare Other | Admitting: Internal Medicine

## 2013-01-15 VITALS — BP 111/63 | HR 48 | Ht 66.0 in | Wt 305.0 lb

## 2013-01-15 DIAGNOSIS — I495 Sick sinus syndrome: Secondary | ICD-10-CM

## 2013-01-15 NOTE — Patient Instructions (Addendum)
Your physician recommends that you schedule a follow-up appointment in: 4 months with Dr Klein  

## 2013-01-15 NOTE — Progress Notes (Signed)
HPI Mr. Skillman returns today for an add on visit for evaluation of bradycardia noted on his ILR. The patient has a h/o falls. He has multiple medical problems including Parkinson's disease. He underwent ILR insertion by Dr. Graciela Husbands because of a h/o altered conciousness and falls. His ILR was interogated and demonstrated bradycardia at 7:40 in the morning. The patient cannot provide corroborating history but his wife is very clear that he always sleeps until after 9:00 every day and was sleeping. In addition, his altered consciousness (falls) was always associated with upright posture. No Known Allergies   Current Outpatient Prescriptions  Medication Sig Dispense Refill  . aspirin EC 81 MG EC tablet Take 1 tablet (81 mg total) by mouth daily.  30 tablet  3  . carbidopa-levodopa-entacapone (STALEVO) 25-100-200 MG per tablet Take 1 tablet by mouth 3 (three) times daily.       . furosemide (LASIX) 40 MG tablet Take 40 mg by mouth 2 (two) times daily.       Marland Kitchen glimepiride (AMARYL) 2 MG tablet Take 2 mg by mouth daily. Pt takes at 2 pm       . guaiFENesin-dextromethorphan (ROBITUSSIN DM) 100-10 MG/5ML syrup Take 5 mLs by mouth 3 (three) times daily as needed. cough      . ipratropium-albuterol (DUONEB) 0.5-2.5 (3) MG/3ML SOLN Take 3 mLs by nebulization every 4 (four) hours as needed. wheezing      . ketoconazole (NIZORAL) 2 % cream Apply 1 application topically daily as needed. itching      . loratadine (CLARITIN) 10 MG tablet Take 10 mg by mouth every morning.       . metFORMIN (GLUCOPHAGE) 500 MG tablet Take 500 mg by mouth daily with breakfast.      . niacin 500 MG tablet Take 500 mg by mouth 2 (two) times daily with a meal.        . olmesartan (BENICAR) 20 MG tablet Take 20 mg by mouth every morning.       Marland Kitchen OVER THE COUNTER MEDICATION Take 1 tablet by mouth 3 (three) times daily. Lithium Aspartate 5 mg       . potassium chloride SA (K-DUR,KLOR-CON) 20 MEQ tablet Take 20 mEq by mouth every morning.        . simvastatin (ZOCOR) 40 MG tablet Take 40 mg by mouth at bedtime.       . temazepam (RESTORIL) 30 MG capsule Take 30 mg by mouth at bedtime as needed.       . traMADol (ULTRAM) 50 MG tablet Take 50 mg by mouth every 8 (eight) hours as needed. Pain         No current facility-administered medications for this visit.   Facility-Administered Medications Ordered in Other Visits  Medication Dose Route Frequency Provider Last Rate Last Dose  . chlorhexidine (HIBICLENS) 4 % liquid 4 application  60 mL Topical Once Loanne Drilling, MD         Past Medical History  Diagnosis Date  . Hypertension   . Stroke 05-03-11    TIA- more than 20 yrs ago.  . Asthma   . Shortness of breath 05-03-11    SOB with exertion only  . Diabetes mellitus 05-03-11    Oral meds used-Dabetes x5 yrs-CBG's (145)  . Neuromuscular disorder 05-03-11    Dx. Parkinson's -essential tremors-generally, ambulates with walker  . Arthritis 05-03-11    most joints- Bil. hips/knees replaced in the past  . Skin cancer 05-03-11  Past hx. skin cancer lesions-removed  . DEMENTIA 05-03-11    hx. Alzheimers-tx. Aricept  . Irregular heart beat   . Anemia 05/14/2011    ROS:   All systems reviewed and negative except as noted in the HPI.   Past Surgical History  Procedure Laterality Date  . Joint replacement  05-03-11    Bil. hip/knee repalcements-now RT. Hip to be revised 05-11-11  . Tonsillectomy    . Uvulopalatopharyngoplasty  05-03-11    x2 . Can.t tolerate cpap machine, doesn't use  . Cholecystectomy    . Eye surgery  05-03-11    lt. eye for lazy eye  . Carpal tunnel release  05-03-11    bilateral  . Total hip revision  05/11/2011    Procedure: TOTAL HIP REVISION;  Surgeon: Gus Rankin Aluisio;  Location: [facility];  Service: Orthopedics;  Laterality: Right;     No family history on file.   History   Social History  . Marital Status: Married    Spouse Name: N/A    Number of Children: N/A  . Years of  Education: N/A   Occupational History  . Not on file.   Social History Main Topics  . Smoking status: Former Smoker -- 1.50 packs/day for 20 years    Quit date: 06/20/1977  . Smokeless tobacco: Never Used  . Alcohol Use: Not on file  . Drug Use: Not on file  . Sexually Active: No   Other Topics Concern  . Not on file   Social History Narrative  . No narrative on file     BP 111/63  Pulse 48  Ht 5\' 6"  (1.676 m)  Wt 305 lb (138.347 kg)  BMI 49.25 kg/m2  Physical Exam:  Well appearing NAD HEENT: Unremarkable Neck:  No JVD, no thyromegally Lymphatics:  No adenopathy Back:  No CVA tenderness Lungs:  Clear HEART:  Regular rate rhythm, no murmurs, no rubs, no clicks Abd:  soft, positive bowel sounds, no organomegally, no rebound, no guarding Ext:  2 plus pulses, no edema, no cyanosis, no clubbing Skin:  No rashes no nodules Neuro:  CN II through XII intact, motor grossly intact   DEVICE  Normal device function.  See PaceArt for details.   Assess/Plan:

## 2013-01-15 NOTE — Telephone Encounter (Signed)
Spoke with pt wife, pt will come to the office this afternoon at 2:15 pm to see dr Ladona Ridgel.

## 2013-01-15 NOTE — Assessment & Plan Note (Signed)
He appears to be asymptomatic with these episodes based on ILR recordings and I have recommended watchful waiting. His current bradycardia is while sleeping.

## 2013-01-15 NOTE — Telephone Encounter (Signed)
Follow up  ° ° ° ° °Pt is returning your call  °

## 2013-01-24 ENCOUNTER — Encounter: Payer: Self-pay | Admitting: Internal Medicine

## 2013-01-26 NOTE — Assessment & Plan Note (Signed)
I tried to talk with him about the medical impact of his weight on his general health and well-being. He has very little insight and no motivation.

## 2013-01-26 NOTE — Assessment & Plan Note (Signed)
Chronic edema with stasis dermatitis. Plan-I have asked him to call his PCP and cardiologist for management and advice.

## 2013-01-26 NOTE — Assessment & Plan Note (Signed)
Weight loss is not happening. He sleeps elevated and wife says he is not snoring much.

## 2013-01-26 NOTE — Assessment & Plan Note (Signed)
Adequate control at this time. We'll watch seasonal change.

## 2013-01-26 NOTE — Assessment & Plan Note (Signed)
They both feel he should continue his allergy shots. We discussed environmental precautions for dust control. He doesn't get out a lot. We will be watching this treatment choice.

## 2013-01-28 ENCOUNTER — Encounter: Payer: Self-pay | Admitting: Internal Medicine

## 2013-02-11 ENCOUNTER — Ambulatory Visit (INDEPENDENT_AMBULATORY_CARE_PROVIDER_SITE_OTHER): Payer: Medicare Other | Admitting: *Deleted

## 2013-02-11 DIAGNOSIS — R55 Syncope and collapse: Secondary | ICD-10-CM

## 2013-03-06 ENCOUNTER — Encounter: Payer: Self-pay | Admitting: Internal Medicine

## 2013-03-16 LAB — PACEMAKER DEVICE OBSERVATION

## 2013-03-20 ENCOUNTER — Encounter: Payer: Self-pay | Admitting: *Deleted

## 2013-03-26 ENCOUNTER — Encounter: Payer: Medicare Other | Admitting: Internal Medicine

## 2013-03-27 ENCOUNTER — Encounter: Payer: Self-pay | Admitting: Internal Medicine

## 2013-03-28 ENCOUNTER — Encounter: Payer: Medicare Other | Admitting: Internal Medicine

## 2013-04-16 ENCOUNTER — Ambulatory Visit (INDEPENDENT_AMBULATORY_CARE_PROVIDER_SITE_OTHER): Payer: Medicare Other

## 2013-04-16 DIAGNOSIS — J309 Allergic rhinitis, unspecified: Secondary | ICD-10-CM

## 2013-04-24 ENCOUNTER — Encounter: Payer: Self-pay | Admitting: Internal Medicine

## 2013-05-08 ENCOUNTER — Encounter: Payer: Self-pay | Admitting: Internal Medicine

## 2013-05-20 ENCOUNTER — Encounter: Payer: Self-pay | Admitting: Internal Medicine

## 2013-05-22 ENCOUNTER — Telehealth: Payer: Self-pay | Admitting: *Deleted

## 2013-05-22 NOTE — Telephone Encounter (Signed)
LMOM for return call due to pause episode on 05-15-13. ? Symptoms/kwm

## 2013-05-24 NOTE — Telephone Encounter (Signed)
Spoke w/pt in regards to episodes. No symptoms

## 2013-05-28 ENCOUNTER — Ambulatory Visit (INDEPENDENT_AMBULATORY_CARE_PROVIDER_SITE_OTHER): Payer: Medicare Other | Admitting: Internal Medicine

## 2013-05-28 ENCOUNTER — Encounter: Payer: Self-pay | Admitting: Internal Medicine

## 2013-05-28 VITALS — BP 110/66 | HR 47 | Ht 68.0 in

## 2013-05-28 DIAGNOSIS — R55 Syncope and collapse: Secondary | ICD-10-CM

## 2013-05-28 DIAGNOSIS — Z959 Presence of cardiac and vascular implant and graft, unspecified: Secondary | ICD-10-CM | POA: Insufficient documentation

## 2013-05-28 DIAGNOSIS — I495 Sick sinus syndrome: Secondary | ICD-10-CM

## 2013-05-28 LAB — MDC_IDC_ENUM_SESS_TYPE_INCLINIC
Date Time Interrogation Session: 20141209174441
Zone Setting Detection Interval: 400 ms

## 2013-05-28 NOTE — Progress Notes (Signed)
and     Patient Care Team: [name], MD as PCP - General (Internal Medicine)   HPI  Preston Carter is a 79 y.o. male Seen in followup for a LINQ  implanted because of falls in the setting of chronic bifascicular block. An episode of asymptomatic bradycardia for which he saw Dr. Reece Agar since he T July.  Past Medical History  Diagnosis Date  . Hypertension   . Stroke 05-03-11    TIA- more than 20 yrs ago.  . Asthma   . Shortness of breath 05-03-11    SOB with exertion only  . Diabetes mellitus 05-03-11    Oral meds used-Dabetes x5 yrs-CBG's (145)  . Neuromuscular disorder 05-03-11    Dx. Parkinson's -essential tremors-generally, ambulates with walker  . Arthritis 05-03-11    most joints- Bil. hips/knees replaced in the past  . Skin cancer 05-03-11    Past hx. skin cancer lesions-removed  . DEMENTIA 05-03-11    hx. Alzheimers-tx. Aricept  . Irregular heart beat   . Anemia 05/14/2011    Past Surgical History  Procedure Laterality Date  . Joint replacement  05-03-11    Bil. hip/knee repalcements-now RT. Hip to be revised 05-11-11  . Tonsillectomy    . Uvulopalatopharyngoplasty  05-03-11    x2 . Can.t tolerate cpap machine, doesn't use  . Cholecystectomy    . Eye surgery  05-03-11    lt. eye for lazy eye  . Carpal tunnel release  05-03-11    bilateral  . Total hip revision  05/11/2011    Procedure: TOTAL HIP REVISION;  Surgeon: Gus Rankin Aluisio;  Location: [facility];  Service: Orthopedics;  Laterality: Right;    Current Outpatient Prescriptions  Medication Sig Dispense Refill  . albuterol (PROVENTIL) (2.5 MG/3ML) 0.083% nebulizer solution As directed      . aspirin EC 81 MG EC tablet Take 1 tablet (81 mg total) by mouth daily.  30 tablet  3  . carbidopa-levodopa-entacapone (STALEVO) 25-100-200 MG per tablet Take 1 tablet by mouth 3 (three) times daily.       . furosemide (LASIX) 40 MG tablet Take 40 mg by mouth 2 (two) times daily.       Marland Kitchen glimepiride (AMARYL) 2 MG  tablet Take 2 mg by mouth daily. Pt takes at 2 pm       . guaiFENesin-dextromethorphan (ROBITUSSIN DM) 100-10 MG/5ML syrup Take 5 mLs by mouth 3 (three) times daily as needed. cough      . HYDROcodone-acetaminophen (NORCO/VICODIN) 5-325 MG per tablet As directed      . ipratropium (ATROVENT) 0.02 % nebulizer solution As directed      . ipratropium-albuterol (DUONEB) 0.5-2.5 (3) MG/3ML SOLN Take 3 mLs by nebulization every 4 (four) hours as needed. wheezing      . ketoconazole (NIZORAL) 2 % cream Apply 1 application topically daily as needed. itching      . loratadine (CLARITIN) 10 MG tablet Take 10 mg by mouth every morning.       . metFORMIN (GLUCOPHAGE) 500 MG tablet Take 500 mg by mouth daily with breakfast.      . methylphenidate (RITALIN) 10 MG tablet 10 mg 2 (two) times daily.      . metolazone (ZAROXOLYN) 5 MG tablet 5 mg daily.      . niacin 500 MG tablet Take 500 mg by mouth 2 (two) times daily with a meal.        . olmesartan (BENICAR) 20 MG tablet Take 20  mg by mouth every morning.       Marland Kitchen OVER THE COUNTER MEDICATION Take 1 tablet by mouth 3 (three) times daily. Lithium Aspartate 5 mg       . potassium chloride SA (K-DUR,KLOR-CON) 20 MEQ tablet Take 20 mEq by mouth every morning.       . simvastatin (ZOCOR) 40 MG tablet Take 40 mg by mouth at bedtime.       . temazepam (RESTORIL) 30 MG capsule Take 30 mg by mouth at bedtime as needed.       . traMADol (ULTRAM) 50 MG tablet Take 50 mg by mouth every 8 (eight) hours as needed. Pain        . triazolam (HALCION) 0.25 MG tablet 0.25 mg daily.       No current facility-administered medications for this visit.   Facility-Administered Medications Ordered in Other Visits  Medication Dose Route Frequency Provider Last Rate Last Dose  . chlorhexidine (HIBICLENS) 4 % liquid 4 application  60 mL Topical Once Loanne Drilling, MD        No Known Allergies  Review of Systems negative except from HPI and PMH  Physical Exam BP 110/66   Pulse 47  Ht 5\' 8"  (1.727 m) Well developed and nourished in no acute distress  Device pocket well healed; without hematoma or erythema.  There is no tethering  Clear Regular rate and rhythm, no murmurs or gallops Abd-soft with active BS No Clubbing cyanosis 3+edema Skin-warm and dry A & Oriented  Grossly normal sensory and motor function     Assessment and  Plan

## 2013-05-28 NOTE — Assessment & Plan Note (Signed)
No diagnostic episodes  See above

## 2013-05-28 NOTE — Assessment & Plan Note (Signed)
He he has had recurrent falls. We do not see a specific arrhythmic correlation. I've asked his wife to try to just identified the day is a which these falls occur. I suspect that it will turn out to be non-arrhythmic. He continues to have nocturnal bradycardia t

## 2013-05-28 NOTE — Patient Instructions (Signed)
Your physician recommends that you continue on your current medications as directed. Please refer to the Current Medication list given to you today.  Your physician recommends that you schedule a follow-up appointment in: 3-4 months with Dr. Klein.   

## 2013-06-19 ENCOUNTER — Encounter: Payer: Self-pay | Admitting: Internal Medicine

## 2013-07-05 ENCOUNTER — Encounter: Payer: Self-pay | Admitting: Internal Medicine

## 2013-07-16 ENCOUNTER — Ambulatory Visit (INDEPENDENT_AMBULATORY_CARE_PROVIDER_SITE_OTHER): Payer: Medicare Other | Admitting: *Deleted

## 2013-07-16 DIAGNOSIS — I455 Other specified heart block: Secondary | ICD-10-CM

## 2013-07-16 LAB — MDC_IDC_ENUM_SESS_TYPE_REMOTE

## 2013-08-02 ENCOUNTER — Encounter: Payer: Self-pay | Admitting: Internal Medicine

## 2013-08-07 ENCOUNTER — Encounter: Payer: Self-pay | Admitting: Internal Medicine

## 2013-08-16 ENCOUNTER — Ambulatory Visit (INDEPENDENT_AMBULATORY_CARE_PROVIDER_SITE_OTHER): Payer: Medicare Other | Admitting: *Deleted

## 2013-08-16 DIAGNOSIS — R55 Syncope and collapse: Secondary | ICD-10-CM

## 2013-08-16 LAB — MDC_IDC_ENUM_SESS_TYPE_REMOTE

## 2013-09-12 ENCOUNTER — Encounter: Payer: Medicare Other | Admitting: Internal Medicine

## 2013-09-16 ENCOUNTER — Ambulatory Visit (INDEPENDENT_AMBULATORY_CARE_PROVIDER_SITE_OTHER): Payer: Medicare Other | Admitting: *Deleted

## 2013-09-16 ENCOUNTER — Encounter: Payer: Self-pay | Admitting: *Deleted

## 2013-09-16 DIAGNOSIS — R55 Syncope and collapse: Secondary | ICD-10-CM

## 2013-09-23 ENCOUNTER — Encounter: Payer: Self-pay | Admitting: Internal Medicine

## 2013-09-24 ENCOUNTER — Encounter: Payer: Self-pay | Admitting: Internal Medicine

## 2013-10-02 ENCOUNTER — Ambulatory Visit (INDEPENDENT_AMBULATORY_CARE_PROVIDER_SITE_OTHER): Payer: Medicare Other

## 2013-10-02 DIAGNOSIS — J309 Allergic rhinitis, unspecified: Secondary | ICD-10-CM

## 2013-10-09 LAB — MDC_IDC_ENUM_SESS_TYPE_REMOTE

## 2013-10-17 ENCOUNTER — Ambulatory Visit (INDEPENDENT_AMBULATORY_CARE_PROVIDER_SITE_OTHER): Payer: Medicare Other | Admitting: *Deleted

## 2013-10-17 DIAGNOSIS — R55 Syncope and collapse: Secondary | ICD-10-CM

## 2013-10-20 ENCOUNTER — Encounter: Payer: Self-pay | Admitting: Internal Medicine

## 2013-10-24 ENCOUNTER — Encounter: Payer: Medicare Other | Admitting: Internal Medicine

## 2013-11-05 ENCOUNTER — Encounter: Payer: Self-pay | Admitting: Internal Medicine

## 2013-11-14 ENCOUNTER — Encounter: Payer: Self-pay | Admitting: Internal Medicine

## 2013-11-18 ENCOUNTER — Ambulatory Visit (INDEPENDENT_AMBULATORY_CARE_PROVIDER_SITE_OTHER): Payer: Medicare Other

## 2013-11-18 DIAGNOSIS — R55 Syncope and collapse: Secondary | ICD-10-CM

## 2013-11-18 LAB — MDC_IDC_ENUM_SESS_TYPE_REMOTE

## 2013-12-10 ENCOUNTER — Encounter (HOSPITAL_BASED_OUTPATIENT_CLINIC_OR_DEPARTMENT_OTHER): Payer: Medicare Other | Attending: General Surgery

## 2013-12-10 DIAGNOSIS — L97809 Non-pressure chronic ulcer of other part of unspecified lower leg with unspecified severity: Secondary | ICD-10-CM | POA: Insufficient documentation

## 2013-12-10 DIAGNOSIS — L98499 Non-pressure chronic ulcer of skin of other sites with unspecified severity: Secondary | ICD-10-CM | POA: Insufficient documentation

## 2013-12-16 ENCOUNTER — Telehealth: Payer: Self-pay | Admitting: *Deleted

## 2013-12-16 NOTE — Telephone Encounter (Signed)
Pt with symptom episodes on ILR interrogation.  LM for patient to call.  Need full transmission sent to evaluate episodes.  Also need to ascertain which types of symptoms patient was having.

## 2013-12-18 ENCOUNTER — Ambulatory Visit (INDEPENDENT_AMBULATORY_CARE_PROVIDER_SITE_OTHER): Payer: Medicare Other | Admitting: *Deleted

## 2013-12-18 DIAGNOSIS — R55 Syncope and collapse: Secondary | ICD-10-CM

## 2013-12-18 NOTE — Telephone Encounter (Signed)
Gave pt's spouse instructions to send manual transmission. Transmission received. Mrs. London did say pt fell on Saturday. She could not pick him up. After he fell, she instigated the patient symptom activator. She could not pick him up and therefore notified an ambulance.

## 2013-12-24 ENCOUNTER — Encounter (HOSPITAL_BASED_OUTPATIENT_CLINIC_OR_DEPARTMENT_OTHER): Payer: Medicare Other | Attending: General Surgery

## 2013-12-24 DIAGNOSIS — L98499 Non-pressure chronic ulcer of skin of other sites with unspecified severity: Secondary | ICD-10-CM | POA: Insufficient documentation

## 2013-12-24 DIAGNOSIS — L97909 Non-pressure chronic ulcer of unspecified part of unspecified lower leg with unspecified severity: Secondary | ICD-10-CM | POA: Diagnosis present

## 2013-12-24 DIAGNOSIS — I87339 Chronic venous hypertension (idiopathic) with ulcer and inflammation of unspecified lower extremity: Secondary | ICD-10-CM | POA: Diagnosis not present

## 2013-12-24 DIAGNOSIS — L97809 Non-pressure chronic ulcer of other part of unspecified lower leg with unspecified severity: Secondary | ICD-10-CM | POA: Insufficient documentation

## 2013-12-24 NOTE — Progress Notes (Signed)
Loop recorder 

## 2013-12-31 ENCOUNTER — Encounter: Payer: Medicare Other | Admitting: Internal Medicine

## 2013-12-31 DIAGNOSIS — L98499 Non-pressure chronic ulcer of skin of other sites with unspecified severity: Secondary | ICD-10-CM | POA: Diagnosis not present

## 2013-12-31 DIAGNOSIS — I87339 Chronic venous hypertension (idiopathic) with ulcer and inflammation of unspecified lower extremity: Secondary | ICD-10-CM | POA: Diagnosis not present

## 2013-12-31 DIAGNOSIS — L97809 Non-pressure chronic ulcer of other part of unspecified lower leg with unspecified severity: Secondary | ICD-10-CM | POA: Diagnosis not present

## 2013-12-31 LAB — MDC_IDC_ENUM_SESS_TYPE_REMOTE
MDC IDC SESS DTM: 20150701130427
MDC IDC SET ZONE DETECTION INTERVAL: 2000 ms
MDC IDC SET ZONE DETECTION INTERVAL: 3000 ms
Zone Setting Detection Interval: 400 ms

## 2014-01-14 ENCOUNTER — Ambulatory Visit: Payer: Medicare Other | Admitting: Internal Medicine

## 2014-01-17 ENCOUNTER — Ambulatory Visit (INDEPENDENT_AMBULATORY_CARE_PROVIDER_SITE_OTHER): Payer: Medicare Other | Admitting: *Deleted

## 2014-01-17 DIAGNOSIS — R55 Syncope and collapse: Secondary | ICD-10-CM

## 2014-01-17 LAB — MDC_IDC_ENUM_SESS_TYPE_REMOTE

## 2014-01-22 NOTE — Progress Notes (Signed)
Loop recorder 

## 2014-01-24 ENCOUNTER — Encounter: Payer: Self-pay | Admitting: Internal Medicine

## 2014-02-13 ENCOUNTER — Encounter: Payer: Self-pay | Admitting: Internal Medicine

## 2014-02-17 ENCOUNTER — Ambulatory Visit (INDEPENDENT_AMBULATORY_CARE_PROVIDER_SITE_OTHER): Payer: Medicare Other | Admitting: *Deleted

## 2014-02-17 DIAGNOSIS — R55 Syncope and collapse: Secondary | ICD-10-CM

## 2014-02-18 ENCOUNTER — Encounter: Payer: Medicare Other | Admitting: Internal Medicine

## 2014-02-20 ENCOUNTER — Ambulatory Visit: Payer: Medicare Other | Admitting: Internal Medicine

## 2014-02-20 ENCOUNTER — Encounter: Payer: Self-pay | Admitting: Internal Medicine

## 2014-02-20 VITALS — BP 130/60 | HR 60 | Ht 66.0 in | Wt 285.2 lb

## 2014-02-20 DIAGNOSIS — Z23 Encounter for immunization: Secondary | ICD-10-CM

## 2014-02-20 NOTE — Patient Instructions (Addendum)
Flu vax  We can continue oxygen 2L for sleep and allergy vaccine 1:10 GO  Please call as needed

## 2014-02-20 NOTE — Progress Notes (Signed)
01/13/12- 80 yoM former smoker followed for OSA/ sp UPPP, allergic rhinitis, asthma LOV   04/23/2010         Wife here  Patient does not use a cpap machine anymore. States breathing is good  has trouble falling asleep ant night . States once he is asleep he sleeps ok without waking up.  He missed his last appointment was of hip replacement with no respiratory complications. He says he couldn't keep CPAP on during sleep so he finally gave it back. He and his wife say he is better since his UPPP surgery and she says she only snores "a little bit". Benadryl sometimes helps sleep consolidation. He continues allergy vaccine. Rarely needs an antihistamine. Has a home nebulizer which is rarely used and he  does not use rescue inhaler anymore at all.  01/14/13- 81 yoM former smoker followed for OSA/ sp UPPP, allergic rhinitis, asthma LOV   04/23/2010         Wife here  FOLLOWS FOR: pt wearing o2 2L/ [employer] at night x6-8 wks- pt states he is fatigued and tired thorughout the day causing him to not be able to do much activity. OSA- s/p UPPP, failed CPAP.  Parkinson's drug may be causing somnolence. Wife says he is not snoring much. He sleeps in a recliner. He can't get in and out of bed. Wife can't get him to exercise. He continues allergy vaccine 1:10/wife with no problems. They both say that helps him still, compared to baseline. Leg edema has been worse despite taking 40 mg of Lasix twice daily and he says urine output has decreased. CXR 1v 12/04/12 IMPRESSION:  1. No acute cardiopulmonary abnormalities.  2. Chronic interstitial coarsening.  Original Report Authenticated By: Kerby Moors, M.D.  02/20/14- 37 yoM former smoker followed for OSA/ sp UPPP, allergic rhinitis, asthma FOLLOWS FOR:  has to have the breathing tx every night or he coughs so bad.  still wearing the oxygen 2L/ [employer] at night.   Allergy vaccine 1:10 / wife gives     ROS-see HPI Constitutional:   No-   weight loss, night sweats,  fevers, chills,+ fatigue, lassitude. HEENT:   No-  headaches, difficulty swallowing, tooth/dental problems, sore throat,       No-  sneezing, itching, ear ache, nasal congestion, post nasal drip,  CV:  No-   chest pain, orthopnea, PND, swelling in lower extremities, anasarca,  dizziness, palpitations Resp: + shortness of breath with exertion or at rest.              No-   productive cough,  No non-productive cough,  No- coughing up of blood.              No-   change in color of mucus.  No- wheezing.   Skin: No-   rash or lesions. GI:  No-   heartburn, indigestion, abdominal pain, nausea, vomiting,  GU: . MS:  No-   joint pain or swelling.   Neuro-     nothing unusual Psych:  No- change in mood or affect. No depression or anxiety.  No memory loss.  OBJ- Physical Exam General- Alert, Oriented, Affect-appropriate, Distress- none acute, obese Skin- rash-none, lesions- none, excoriation- none Lymphadenopathy- none Head- atraumatic            Eyes- Gross vision intact, PERRLA, conjunctivae and secretions clear            Ears- Hearing, canals-normal  Nose- Clear, no-Septal dev, +mucus bridging, polyps, erosion, perforation             Throat- Mallampati II , mucosa clear , drainage- none, tonsils- atrophic. +throat clearing Neck- flexible , trachea midline, no stridor , thyroid nl, carotid no bruit Chest - symmetrical excursion , unlabored           Heart/CV- RRR , no murmur , no gallop  , no rub, nl s1 s2                           - JVD- none , edema+3, stasis changes+red stasis dermatitis, varices- none           Lung- clear to P&A, wheeze- none, cough- none , dullness-none, rub- none           Chest wall-  Abd-  Br/ Gen/ Rectal- Not done, not indicated Extrem- cyanosis- none, clubbing, none, atrophy- none, strength- nl. Using a walker Neuro- head bob tremor, pill rolling tremor

## 2014-02-20 NOTE — Progress Notes (Signed)
Loop recorder 

## 2014-02-26 LAB — MDC_IDC_ENUM_SESS_TYPE_REMOTE: MDC IDC SESS DTM: 20150416040500

## 2014-02-27 ENCOUNTER — Encounter: Payer: Self-pay | Admitting: Internal Medicine

## 2014-03-04 LAB — MDC_IDC_ENUM_SESS_TYPE_REMOTE

## 2014-03-05 ENCOUNTER — Telehealth: Payer: Self-pay | Admitting: Internal Medicine

## 2014-03-05 NOTE — Telephone Encounter (Signed)
Informed wife that [facility] recorder did not see anything abnormal. No reports since August. She states that she didn't think we would see anything, but wanted to make sure.

## 2014-03-05 NOTE — Telephone Encounter (Signed)
New message      Want Dr Caryl Comes to know pt fell twice last night (10pm and 5am this am).

## 2014-03-17 ENCOUNTER — Ambulatory Visit (INDEPENDENT_AMBULATORY_CARE_PROVIDER_SITE_OTHER): Payer: Medicare Other

## 2014-03-17 DIAGNOSIS — J309 Allergic rhinitis, unspecified: Secondary | ICD-10-CM

## 2014-03-18 ENCOUNTER — Ambulatory Visit (INDEPENDENT_AMBULATORY_CARE_PROVIDER_SITE_OTHER): Payer: Medicare Other | Admitting: *Deleted

## 2014-03-18 DIAGNOSIS — R55 Syncope and collapse: Secondary | ICD-10-CM

## 2014-03-28 NOTE — Progress Notes (Signed)
Loop recorder 

## 2014-03-31 LAB — MDC_IDC_ENUM_SESS_TYPE_REMOTE

## 2014-04-04 ENCOUNTER — Encounter: Payer: Self-pay | Admitting: Internal Medicine

## 2014-04-07 ENCOUNTER — Encounter: Payer: Self-pay | Admitting: Internal Medicine

## 2014-04-17 ENCOUNTER — Ambulatory Visit (INDEPENDENT_AMBULATORY_CARE_PROVIDER_SITE_OTHER): Payer: Medicare Other | Admitting: *Deleted

## 2014-04-17 DIAGNOSIS — R55 Syncope and collapse: Secondary | ICD-10-CM

## 2014-04-23 NOTE — Progress Notes (Signed)
Loop recorder 

## 2014-04-25 ENCOUNTER — Encounter (HOSPITAL_BASED_OUTPATIENT_CLINIC_OR_DEPARTMENT_OTHER): Payer: Medicare Other | Attending: Internal Medicine

## 2014-04-25 DIAGNOSIS — E11621 Type 2 diabetes mellitus with foot ulcer: Secondary | ICD-10-CM | POA: Insufficient documentation

## 2014-04-25 DIAGNOSIS — L97519 Non-pressure chronic ulcer of other part of right foot with unspecified severity: Secondary | ICD-10-CM | POA: Insufficient documentation

## 2014-04-26 NOTE — Progress Notes (Signed)
[facility]  NAME:  BYRL, LATIN NO.:  [id number]  MEDICAL RECORD NO.:  [id number]      DATE OF BIRTH:  Jun 15, 1932  PHYSICIAN:  Ricard Dillon, M.D.      VISIT DATE:                                  OFFICE VISIT   [name] is an 80 year old man that we have followed previously in this clinic, however, most of this was for venous stasis ulcerations on his bilateral lower extremities.  He has severe bilateral venous stasis, however the last time he was discharged from here in the summer in the  of this year, we provided him with Juxta-Lite stockings which seems to have helped maintain the edema and prevent further ulceration.  However, the patient was also a type 2 diabetic on insulin with neuropathy, Charcot feet bilaterally.  Uses specialty diabetic shoes. Roughly 10 days to 2 weeks ago noted some discomfort on his right lateral foot.  His wife noted there was some ulceration, he has been using peroxide, calamine, and a Kerlix wrap.  He was also prescribed Septra which he is completing today apparently for significant streaking erythema according to him coming from the wound, this is considerably improved.  PAST MEDICAL HISTORY:  Includes hypertension, type 2 diabetes, on insulin, osteoarthritis, asthma, gastroesophageal reflux, Parkinson disease, sleep apnea, hyperlipidemia, cerebrovascular disease, obesity, lymphedema, and peripheral vascular disease, and severe bilateral venous stasis.  PAST SURGICAL HISTORY:  Total right hip replacement, cholecystectomy, bilateral knee arthroplasty, left hip replacement, tonsillectomy, bilateral cataract extractions.  CURRENT MEDICATIONS:  Include albuterol nebulizers 2.5 p.r.n., ASA 81 daily, Stalevo 25/100/200 three times daily, furosemide 40 b.i.d., glimepiride 2 mg daily, Atrovent nebulizers p.r.n., Claritin 10 mg daily, Ritalin 10 b.i.d., Zaroxolyn 5 daily, K-Dur 20 daily,  simvastatin 40 daily, tramadol 50 three times daily p.r.n., triazolam 0.25 nightly, Levemir insulin 40 units b.i.d.  On examination, temperature is 97.9, pulse 49, respirations 18, blood pressure 120/45.  Blood glucose was 80.  ABG is calculated in this clinic at 0.92.  Dorsalis pedis pulses were palpable.  He has severe bilateral venous stasis dermatitis with brawny erythema bilaterally, however there are no open wounds here.  On the right plantar foot, lateral aspect mostly towards the hindfoot is the wound in question measuring 1.3 x 1.5 x 0.1.  This is covered by a tight adherent tan-colored eschar.  This was very difficult to debride. His wife thinks this is dried out calamine lotion, I do not think so. There was some erythema extending towards the forefoot here by description of his wife, this is a lot better but it is still tender.  IMPRESSIONS:  Charcot deformity right foot, now with an ulcer on the right plantar foot.  The attempt at debridement here was not very successful using a #15 scalpel.  He is going to need a Santyl based dressing with foam covering which his wife will change daily.  I am still somewhat concerned that there could be continued cellulitis here and I have extended the Septra that he was already on for a week for further week.  As the patient needed Medicare forms to fill out for a new pair of diabetic shoes, I took the liberty of filling this out here.  We will  see him again in a week's time.          ______________________________ Ricard Dillon, M.D.     MGR/MEDQ  D:  04/25/2014  T:  04/26/2014  Job:  [id number]

## 2014-04-29 ENCOUNTER — Encounter: Payer: Self-pay | Admitting: Internal Medicine

## 2014-04-29 LAB — MDC_IDC_ENUM_SESS_TYPE_REMOTE

## 2014-05-02 ENCOUNTER — Other Ambulatory Visit: Payer: Self-pay | Admitting: Internal Medicine

## 2014-05-02 ENCOUNTER — Ambulatory Visit (HOSPITAL_COMMUNITY)
Admission: RE | Admit: 2014-05-02 | Discharge: 2014-05-02 | Disposition: A | Discharge status: Home / Self Care | Payer: Medicare Other | Source: Ambulatory Visit | Attending: Internal Medicine | Admitting: Internal Medicine

## 2014-05-02 ENCOUNTER — Encounter: Payer: Self-pay | Admitting: Internal Medicine

## 2014-05-02 DIAGNOSIS — M21071 Valgus deformity, not elsewhere classified, right ankle: Secondary | ICD-10-CM | POA: Insufficient documentation

## 2014-05-02 DIAGNOSIS — S91301A Unspecified open wound, right foot, initial encounter: Secondary | ICD-10-CM | POA: Diagnosis present

## 2014-05-02 DIAGNOSIS — M869 Osteomyelitis, unspecified: Secondary | ICD-10-CM

## 2014-05-02 DIAGNOSIS — M19071 Primary osteoarthritis, right ankle and foot: Secondary | ICD-10-CM | POA: Diagnosis not present

## 2014-05-02 DIAGNOSIS — L97519 Non-pressure chronic ulcer of other part of right foot with unspecified severity: Secondary | ICD-10-CM | POA: Diagnosis not present

## 2014-05-02 DIAGNOSIS — M2140 Flat foot [pes planus] (acquired), unspecified foot: Secondary | ICD-10-CM | POA: Insufficient documentation

## 2014-05-02 DIAGNOSIS — E11621 Type 2 diabetes mellitus with foot ulcer: Secondary | ICD-10-CM | POA: Diagnosis not present

## 2014-05-09 DIAGNOSIS — L97519 Non-pressure chronic ulcer of other part of right foot with unspecified severity: Secondary | ICD-10-CM | POA: Diagnosis not present

## 2014-05-09 DIAGNOSIS — E11621 Type 2 diabetes mellitus with foot ulcer: Secondary | ICD-10-CM | POA: Diagnosis not present

## 2014-05-23 ENCOUNTER — Encounter (HOSPITAL_BASED_OUTPATIENT_CLINIC_OR_DEPARTMENT_OTHER): Payer: Medicare Other | Attending: Internal Medicine

## 2014-05-23 DIAGNOSIS — L97809 Non-pressure chronic ulcer of other part of unspecified lower leg with unspecified severity: Secondary | ICD-10-CM | POA: Insufficient documentation

## 2014-05-23 DIAGNOSIS — L97819 Non-pressure chronic ulcer of other part of right lower leg with unspecified severity: Secondary | ICD-10-CM | POA: Diagnosis not present

## 2014-05-23 DIAGNOSIS — E11621 Type 2 diabetes mellitus with foot ulcer: Secondary | ICD-10-CM | POA: Insufficient documentation

## 2014-05-23 DIAGNOSIS — L97411 Non-pressure chronic ulcer of right heel and midfoot limited to breakdown of skin: Secondary | ICD-10-CM | POA: Insufficient documentation

## 2014-05-29 ENCOUNTER — Encounter (HOSPITAL_COMMUNITY): Payer: Self-pay | Admitting: Internal Medicine

## 2014-05-30 ENCOUNTER — Encounter: Payer: Medicare Other | Admitting: Internal Medicine

## 2014-06-06 DIAGNOSIS — L97819 Non-pressure chronic ulcer of other part of right lower leg with unspecified severity: Secondary | ICD-10-CM | POA: Diagnosis not present

## 2014-06-06 DIAGNOSIS — L97809 Non-pressure chronic ulcer of other part of unspecified lower leg with unspecified severity: Secondary | ICD-10-CM | POA: Diagnosis not present

## 2014-06-06 DIAGNOSIS — L97411 Non-pressure chronic ulcer of right heel and midfoot limited to breakdown of skin: Secondary | ICD-10-CM | POA: Diagnosis not present

## 2014-06-06 DIAGNOSIS — E11621 Type 2 diabetes mellitus with foot ulcer: Secondary | ICD-10-CM | POA: Diagnosis not present

## 2014-06-10 ENCOUNTER — Other Ambulatory Visit: Payer: Self-pay | Admitting: Dermatology

## 2014-06-19 DIAGNOSIS — E11621 Type 2 diabetes mellitus with foot ulcer: Secondary | ICD-10-CM | POA: Diagnosis not present

## 2014-06-19 DIAGNOSIS — L97819 Non-pressure chronic ulcer of other part of right lower leg with unspecified severity: Secondary | ICD-10-CM | POA: Diagnosis not present

## 2014-06-19 DIAGNOSIS — L97411 Non-pressure chronic ulcer of right heel and midfoot limited to breakdown of skin: Secondary | ICD-10-CM | POA: Diagnosis not present

## 2014-06-19 DIAGNOSIS — L97809 Non-pressure chronic ulcer of other part of unspecified lower leg with unspecified severity: Secondary | ICD-10-CM | POA: Diagnosis not present

## 2014-06-23 ENCOUNTER — Encounter: Payer: Self-pay | Admitting: Internal Medicine

## 2014-07-03 ENCOUNTER — Encounter (HOSPITAL_BASED_OUTPATIENT_CLINIC_OR_DEPARTMENT_OTHER): Payer: Medicare Other | Attending: Internal Medicine

## 2014-07-03 DIAGNOSIS — L97929 Non-pressure chronic ulcer of unspecified part of left lower leg with unspecified severity: Secondary | ICD-10-CM | POA: Diagnosis not present

## 2014-07-03 DIAGNOSIS — E11622 Type 2 diabetes mellitus with other skin ulcer: Secondary | ICD-10-CM | POA: Diagnosis not present

## 2014-07-03 DIAGNOSIS — E11610 Type 2 diabetes mellitus with diabetic neuropathic arthropathy: Secondary | ICD-10-CM | POA: Diagnosis not present

## 2014-07-03 DIAGNOSIS — L97919 Non-pressure chronic ulcer of unspecified part of right lower leg with unspecified severity: Secondary | ICD-10-CM | POA: Insufficient documentation

## 2014-07-03 DIAGNOSIS — E1140 Type 2 diabetes mellitus with diabetic neuropathy, unspecified: Secondary | ICD-10-CM | POA: Insufficient documentation

## 2014-07-03 DIAGNOSIS — E11621 Type 2 diabetes mellitus with foot ulcer: Secondary | ICD-10-CM | POA: Diagnosis not present

## 2014-07-03 DIAGNOSIS — L97519 Non-pressure chronic ulcer of other part of right foot with unspecified severity: Secondary | ICD-10-CM | POA: Diagnosis not present

## 2014-07-11 ENCOUNTER — Encounter: Payer: Medicare Other | Admitting: Internal Medicine

## 2014-07-17 DIAGNOSIS — L97519 Non-pressure chronic ulcer of other part of right foot with unspecified severity: Secondary | ICD-10-CM | POA: Diagnosis not present

## 2014-07-17 DIAGNOSIS — E11622 Type 2 diabetes mellitus with other skin ulcer: Secondary | ICD-10-CM | POA: Diagnosis not present

## 2014-07-17 DIAGNOSIS — E11621 Type 2 diabetes mellitus with foot ulcer: Secondary | ICD-10-CM | POA: Diagnosis not present

## 2014-07-17 DIAGNOSIS — L97929 Non-pressure chronic ulcer of unspecified part of left lower leg with unspecified severity: Secondary | ICD-10-CM | POA: Diagnosis not present

## 2014-07-24 ENCOUNTER — Encounter (HOSPITAL_BASED_OUTPATIENT_CLINIC_OR_DEPARTMENT_OTHER): Payer: Medicare Other | Attending: Internal Medicine

## 2014-07-24 DIAGNOSIS — E1140 Type 2 diabetes mellitus with diabetic neuropathy, unspecified: Secondary | ICD-10-CM | POA: Insufficient documentation

## 2014-07-24 DIAGNOSIS — L97411 Non-pressure chronic ulcer of right heel and midfoot limited to breakdown of skin: Secondary | ICD-10-CM | POA: Insufficient documentation

## 2014-07-24 DIAGNOSIS — E11621 Type 2 diabetes mellitus with foot ulcer: Secondary | ICD-10-CM | POA: Diagnosis not present

## 2014-07-30 ENCOUNTER — Ambulatory Visit (INDEPENDENT_AMBULATORY_CARE_PROVIDER_SITE_OTHER): Payer: Medicare Other

## 2014-07-30 DIAGNOSIS — J309 Allergic rhinitis, unspecified: Secondary | ICD-10-CM

## 2014-08-08 ENCOUNTER — Telehealth: Payer: Self-pay | Admitting: Cardiology

## 2014-08-08 NOTE — Telephone Encounter (Signed)
Spoke w/ wife and informed her that pt needed to send manual transmission. She verbalized understanding and said she would get it done today.

## 2014-08-15 ENCOUNTER — Encounter: Payer: Self-pay | Admitting: Cardiology

## 2014-08-22 ENCOUNTER — Telehealth: Payer: Self-pay | Admitting: Internal Medicine

## 2014-08-22 NOTE — Telephone Encounter (Signed)
New Msg       Pt wife calling states she received device monitor in the mail yesterday and is needing instructions.    Please return pt call.

## 2014-08-22 NOTE — Telephone Encounter (Signed)
Walked pt wife through a manual transmission.

## 2014-08-28 ENCOUNTER — Encounter: Payer: Self-pay | Admitting: Internal Medicine

## 2014-09-15 ENCOUNTER — Ambulatory Visit (INDEPENDENT_AMBULATORY_CARE_PROVIDER_SITE_OTHER): Payer: Medicare Other | Admitting: *Deleted

## 2014-09-15 DIAGNOSIS — R55 Syncope and collapse: Secondary | ICD-10-CM | POA: Diagnosis not present

## 2014-09-16 NOTE — Progress Notes (Signed)
Loop recorder 

## 2014-09-18 ENCOUNTER — Telehealth: Payer: Self-pay | Admitting: *Deleted

## 2014-09-18 NOTE — Telephone Encounter (Signed)
Spoke w/pt's wife in regards to symptom episode recorded on 3-30. Pt was having chest pain while sitting in recliner. Pt has never had any chest pain before now. Episode to be reviewed by SK and pt has appt with SK on 09-22-14.

## 2014-09-18 NOTE — Telephone Encounter (Signed)
LMOM for symptoms during symptom episode recorded on 3-30 around 2043.

## 2014-09-22 ENCOUNTER — Encounter: Payer: Self-pay | Admitting: Internal Medicine

## 2014-09-22 ENCOUNTER — Ambulatory Visit (INDEPENDENT_AMBULATORY_CARE_PROVIDER_SITE_OTHER): Payer: Medicare Other | Admitting: Internal Medicine

## 2014-09-22 VITALS — BP 130/52 | HR 55 | Ht 70.0 in

## 2014-09-22 DIAGNOSIS — Z45018 Encounter for adjustment and management of other part of cardiac pacemaker: Secondary | ICD-10-CM

## 2014-09-22 DIAGNOSIS — I495 Sick sinus syndrome: Secondary | ICD-10-CM | POA: Diagnosis not present

## 2014-09-22 LAB — MDC_IDC_ENUM_SESS_TYPE_INCLINIC
Date Time Interrogation Session: 20160404165137
MDC IDC SET ZONE DETECTION INTERVAL: 2000 ms
MDC IDC SET ZONE DETECTION INTERVAL: 400 ms
Zone Setting Detection Interval: 3000 ms

## 2014-09-22 NOTE — Patient Instructions (Signed)
Your physician recommends that you continue on your current medications as directed. Please refer to the Current Medication list given to you today.  Your physician wants you to follow-up in: 1 year with Dr. Klein.  You will receive a reminder letter in the mail two months in advance. If you don't receive a letter, please call our office to schedule the follow-up appointment.  

## 2014-09-22 NOTE — Progress Notes (Signed)
Patient Care Team: Leanna Battles, MD as PCP - General (Internal Medicine)  [facility]   HPI  Preston Carter is a 81 y.o. male Seen in follow-up for a LINQ implanted for chronic bifascicular block and falls. Asymptomatic bradycardia have been noted summer 2014.  No recurrent syncope   Echo 6/14  Normal LV function   Past Medical History  Diagnosis Date  . Hypertension   . Stroke 05-03-11    TIA- more than 20 yrs ago.  . Asthma   . Shortness of breath 05-03-11    SOB with exertion only  . Diabetes mellitus 05-03-11    Oral meds used-Dabetes x5 yrs-CBG's (145)  . Neuromuscular disorder 05-03-11    Dx. Parkinson's -essential tremors-generally, ambulates with walker  . Arthritis 05-03-11    most joints- Bil. hips/knees replaced in the past  . Skin cancer 05-03-11    Past hx. skin cancer lesions-removed  . DEMENTIA 05-03-11    hx. Alzheimers-tx. Aricept  . Irregular heart beat   . Anemia 05/14/2011    Past Surgical History  Procedure Laterality Date  . Joint replacement  05-03-11    Bil. hip/knee repalcements-now RT. Hip to be revised 05-11-11  . Tonsillectomy    . Uvulopalatopharyngoplasty  05-03-11    x2 . Can.t tolerate cpap machine, doesn't use  . Cholecystectomy    . Eye surgery  05-03-11    lt. eye for lazy eye  . Carpal tunnel release  05-03-11    bilateral  . Total hip revision  05/11/2011    Procedure: TOTAL HIP REVISION;  Surgeon: Dione Plover Aluisio;  Location: [facility];  Service: Orthopedics;  Laterality: Right;  . Loop recorder implant N/A 12/05/2012    Procedure: LOOP RECORDER IMPLANT;  Surgeon: Deboraha Sprang, MD;  Location: [facility] CATH LAB;  Service: Cardiovascular;  Laterality: N/A;    Current Outpatient Prescriptions  Medication Sig Dispense Refill  . albuterol (PROVENTIL) (2.5 MG/3ML) 0.083% nebulizer solution As directed    . aspirin EC 81 MG EC tablet Take 1 tablet (81 mg total) by mouth daily. 30 tablet 3  .  carbidopa-levodopa-entacapone (STALEVO) 25-100-200 MG per tablet Take 1 tablet by mouth 3 (three) times daily.     . cholecalciferol (VITAMIN D) 1000 UNITS tablet Take 1,000 Units by mouth daily.    . furosemide (LASIX) 40 MG tablet Take 40 mg by mouth 2 (two) times daily.     Marland Kitchen glimepiride (AMARYL) 2 MG tablet Take 2 mg by mouth daily. Pt takes at 2 pm     . guaiFENesin-dextromethorphan (ROBITUSSIN DM) 100-10 MG/5ML syrup Take 5 mLs by mouth 3 (three) times daily as needed. cough    . HYDROcodone-acetaminophen (NORCO/VICODIN) 5-325 MG per tablet As directed    . ipratropium (ATROVENT) 0.02 % nebulizer solution As directed    . ipratropium-albuterol (DUONEB) 0.5-2.5 (3) MG/3ML SOLN Take 3 mLs by nebulization every 4 (four) hours as needed. wheezing    . ketoconazole (NIZORAL) 2 % cream Apply 1 application topically daily as needed. itching    . loratadine (CLARITIN) 10 MG tablet Take 10 mg by mouth every morning.     . metFORMIN (GLUCOPHAGE) 500 MG tablet Take 500 mg by mouth daily with breakfast.    . metolazone (ZAROXOLYN) 5 MG tablet 5 mg daily.    Marland Kitchen OVER THE COUNTER MEDICATION Take 1 tablet by mouth 3 (three) times daily. Lithium Aspartate 5 mg     . potassium chloride SA (  K-DUR,KLOR-CON) 20 MEQ tablet Take 20 mEq by mouth 2 (two) times daily.     . simvastatin (ZOCOR) 40 MG tablet Take 40 mg by mouth at bedtime.     . traMADol (ULTRAM) 50 MG tablet Take 50 mg by mouth every 8 (eight) hours as needed. Pain      . triazolam (HALCION) 0.25 MG tablet 0.25 mg daily.    . methylphenidate (RITALIN) 10 MG tablet 10 mg 2 (two) times daily.     No current facility-administered medications for this visit.   Facility-Administered Medications Ordered in Other Visits  Medication Dose Route Frequency Provider Last Rate Last Dose  . chlorhexidine (HIBICLENS) 4 % liquid 4 application  60 mL Topical Once Gaynelle Arabian, MD        No Known Allergies  Review of Systems negative except from HPI and  PMH  Physical Exam BP 130/52 mmHg  Pulse 55  Ht 5\' 10"  (1.778 m)  Wt  Well developed and Morbidly obese  Male in no acute distress HENT normal E scleral and icterus clear Neck Supple Device pocket well healed; without hematoma or erythema.  There is no tethering  Clear to ausculation  Regular rate and rhythm, no murmurs gallops or rub Soft with active bowel sounds No clubbing cyanosis 2-3+ Edema  wrapped Alert and oriented, grossly normal motor and sensory function sitting in a wheel chair Skin Warm and Dry  ECG ordered sinus at 52 16/16/52 RBBB/LAFB  Assessment and  Plan  Bifascicular block  Falls  HFpEF  Implantable loop recorder  Renal insufficiency GFR 6/14 was  34  Sinus bradycardia  Sinus bradycardia appears to be without symptoms, no contributing  medications  Pauses were noted about a year ago; none subsequently.  Blood pressure is reasonably controlled.  There is significant volume overload; I wonder if she would be better treated with torsemide\  Renal function assessment is deferred to Dr. Philip Aspen Dr. Ophelia Charter

## 2014-10-07 LAB — MDC_IDC_ENUM_SESS_TYPE_REMOTE: MDC IDC SESS DTM: 20160331040500

## 2014-10-09 ENCOUNTER — Encounter: Payer: Self-pay | Admitting: Internal Medicine

## 2014-10-15 ENCOUNTER — Ambulatory Visit (INDEPENDENT_AMBULATORY_CARE_PROVIDER_SITE_OTHER): Payer: Medicare Other | Admitting: *Deleted

## 2014-10-15 DIAGNOSIS — R55 Syncope and collapse: Secondary | ICD-10-CM

## 2014-10-16 ENCOUNTER — Encounter: Payer: Self-pay | Admitting: Internal Medicine

## 2014-10-16 NOTE — Progress Notes (Signed)
Loop recorder 

## 2014-11-14 ENCOUNTER — Ambulatory Visit (INDEPENDENT_AMBULATORY_CARE_PROVIDER_SITE_OTHER): Payer: Medicare Other | Admitting: *Deleted

## 2014-11-14 DIAGNOSIS — R55 Syncope and collapse: Secondary | ICD-10-CM | POA: Diagnosis not present

## 2014-11-14 NOTE — Progress Notes (Signed)
Loop recorder 

## 2014-11-18 LAB — CUP PACEART REMOTE DEVICE CHECK: MDC IDC SESS DTM: 20160531134352

## 2014-11-25 LAB — CUP PACEART REMOTE DEVICE CHECK: MDC IDC SESS DTM: 20160607121932

## 2014-12-15 ENCOUNTER — Encounter: Payer: Self-pay | Admitting: Internal Medicine

## 2014-12-15 ENCOUNTER — Ambulatory Visit (INDEPENDENT_AMBULATORY_CARE_PROVIDER_SITE_OTHER): Payer: Medicare Other | Admitting: *Deleted

## 2014-12-15 DIAGNOSIS — R55 Syncope and collapse: Secondary | ICD-10-CM

## 2014-12-16 ENCOUNTER — Encounter: Payer: Self-pay | Admitting: Cardiology

## 2014-12-16 LAB — CUP PACEART REMOTE DEVICE CHECK: MDC IDC SESS DTM: 20160628131742

## 2014-12-17 ENCOUNTER — Encounter: Payer: Self-pay | Admitting: Internal Medicine

## 2014-12-17 NOTE — Progress Notes (Signed)
Loop recorder 

## 2014-12-25 ENCOUNTER — Encounter: Payer: Self-pay | Admitting: Cardiology

## 2014-12-31 ENCOUNTER — Encounter: Payer: Self-pay | Admitting: Cardiology

## 2015-01-05 ENCOUNTER — Ambulatory Visit (INDEPENDENT_AMBULATORY_CARE_PROVIDER_SITE_OTHER): Payer: Medicare Other

## 2015-01-05 ENCOUNTER — Telehealth: Payer: Self-pay | Admitting: Internal Medicine

## 2015-01-05 DIAGNOSIS — J309 Allergic rhinitis, unspecified: Secondary | ICD-10-CM | POA: Diagnosis not present

## 2015-01-05 NOTE — Telephone Encounter (Signed)
Date Mixed: 01/05/15 Vial: 1 Strength: 1:10 Here/Mail/Pick Up: mail Mixed By: Laurette Schimke

## 2015-01-06 ENCOUNTER — Encounter: Payer: Self-pay | Admitting: Internal Medicine

## 2015-01-07 ENCOUNTER — Encounter: Payer: Self-pay | Admitting: Cardiology

## 2015-01-07 ENCOUNTER — Telehealth: Payer: Self-pay | Admitting: Internal Medicine

## 2015-01-07 NOTE — Telephone Encounter (Signed)
New message    FYI--Pt wife states that they do not want his monitor to be checked anymore, to much on them for this time.  Pt received letter in mail regarding needing the monitor checked.

## 2015-01-08 ENCOUNTER — Encounter (HOSPITAL_BASED_OUTPATIENT_CLINIC_OR_DEPARTMENT_OTHER): Payer: Medicare Other | Attending: Internal Medicine

## 2015-01-08 DIAGNOSIS — E0842 Diabetes mellitus due to underlying condition with diabetic polyneuropathy: Secondary | ICD-10-CM | POA: Diagnosis not present

## 2015-01-08 DIAGNOSIS — E11621 Type 2 diabetes mellitus with foot ulcer: Secondary | ICD-10-CM | POA: Insufficient documentation

## 2015-01-08 DIAGNOSIS — M1990 Unspecified osteoarthritis, unspecified site: Secondary | ICD-10-CM | POA: Diagnosis not present

## 2015-01-08 DIAGNOSIS — G4730 Sleep apnea, unspecified: Secondary | ICD-10-CM | POA: Insufficient documentation

## 2015-01-08 DIAGNOSIS — J45909 Unspecified asthma, uncomplicated: Secondary | ICD-10-CM | POA: Diagnosis not present

## 2015-01-08 DIAGNOSIS — I83223 Varicose veins of left lower extremity with both ulcer of ankle and inflammation: Secondary | ICD-10-CM | POA: Insufficient documentation

## 2015-01-08 DIAGNOSIS — L97511 Non-pressure chronic ulcer of other part of right foot limited to breakdown of skin: Secondary | ICD-10-CM | POA: Insufficient documentation

## 2015-01-08 DIAGNOSIS — I83213 Varicose veins of right lower extremity with both ulcer of ankle and inflammation: Secondary | ICD-10-CM | POA: Diagnosis not present

## 2015-01-08 DIAGNOSIS — I10 Essential (primary) hypertension: Secondary | ICD-10-CM | POA: Insufficient documentation

## 2015-01-08 NOTE — Telephone Encounter (Signed)
informed pt wife that we would need to get MD to ok to not follow loop recorder anymore. She verbalized understanding.

## 2015-01-08 NOTE — Telephone Encounter (Signed)
LMOVM for pt wife to return call.  

## 2015-01-09 ENCOUNTER — Encounter: Payer: Self-pay | Admitting: Internal Medicine

## 2015-01-15 DIAGNOSIS — E11621 Type 2 diabetes mellitus with foot ulcer: Secondary | ICD-10-CM | POA: Diagnosis not present

## 2015-01-15 DIAGNOSIS — I83223 Varicose veins of left lower extremity with both ulcer of ankle and inflammation: Secondary | ICD-10-CM | POA: Diagnosis not present

## 2015-01-15 DIAGNOSIS — I83213 Varicose veins of right lower extremity with both ulcer of ankle and inflammation: Secondary | ICD-10-CM | POA: Diagnosis not present

## 2015-01-15 DIAGNOSIS — E0842 Diabetes mellitus due to underlying condition with diabetic polyneuropathy: Secondary | ICD-10-CM | POA: Diagnosis not present

## 2015-01-22 ENCOUNTER — Encounter (HOSPITAL_BASED_OUTPATIENT_CLINIC_OR_DEPARTMENT_OTHER): Payer: Medicare Other | Attending: Internal Medicine

## 2015-01-22 ENCOUNTER — Ambulatory Visit (HOSPITAL_BASED_OUTPATIENT_CLINIC_OR_DEPARTMENT_OTHER): Payer: Medicare Other

## 2015-01-22 DIAGNOSIS — I509 Heart failure, unspecified: Secondary | ICD-10-CM | POA: Insufficient documentation

## 2015-01-22 DIAGNOSIS — I10 Essential (primary) hypertension: Secondary | ICD-10-CM | POA: Diagnosis not present

## 2015-01-22 DIAGNOSIS — L97412 Non-pressure chronic ulcer of right heel and midfoot with fat layer exposed: Secondary | ICD-10-CM | POA: Diagnosis not present

## 2015-01-22 DIAGNOSIS — Z6841 Body Mass Index (BMI) 40.0 and over, adult: Secondary | ICD-10-CM | POA: Diagnosis not present

## 2015-01-22 DIAGNOSIS — L97321 Non-pressure chronic ulcer of left ankle limited to breakdown of skin: Secondary | ICD-10-CM | POA: Diagnosis not present

## 2015-01-22 DIAGNOSIS — L97821 Non-pressure chronic ulcer of other part of left lower leg limited to breakdown of skin: Secondary | ICD-10-CM | POA: Diagnosis not present

## 2015-01-22 DIAGNOSIS — E11621 Type 2 diabetes mellitus with foot ulcer: Secondary | ICD-10-CM | POA: Insufficient documentation

## 2015-01-22 DIAGNOSIS — L97811 Non-pressure chronic ulcer of other part of right lower leg limited to breakdown of skin: Secondary | ICD-10-CM | POA: Insufficient documentation

## 2015-01-22 DIAGNOSIS — E1151 Type 2 diabetes mellitus with diabetic peripheral angiopathy without gangrene: Secondary | ICD-10-CM | POA: Diagnosis not present

## 2015-01-22 DIAGNOSIS — I872 Venous insufficiency (chronic) (peripheral): Secondary | ICD-10-CM | POA: Insufficient documentation

## 2015-01-22 DIAGNOSIS — I890 Lymphedema, not elsewhere classified: Secondary | ICD-10-CM | POA: Insufficient documentation

## 2015-01-22 DIAGNOSIS — E11610 Type 2 diabetes mellitus with diabetic neuropathic arthropathy: Secondary | ICD-10-CM | POA: Diagnosis not present

## 2015-01-22 DIAGNOSIS — L97311 Non-pressure chronic ulcer of right ankle limited to breakdown of skin: Secondary | ICD-10-CM | POA: Diagnosis not present

## 2015-01-23 ENCOUNTER — Encounter: Payer: Self-pay | Admitting: Cardiology

## 2015-01-29 DIAGNOSIS — L97311 Non-pressure chronic ulcer of right ankle limited to breakdown of skin: Secondary | ICD-10-CM | POA: Diagnosis not present

## 2015-01-29 DIAGNOSIS — L97412 Non-pressure chronic ulcer of right heel and midfoot with fat layer exposed: Secondary | ICD-10-CM | POA: Diagnosis not present

## 2015-01-29 DIAGNOSIS — E11621 Type 2 diabetes mellitus with foot ulcer: Secondary | ICD-10-CM | POA: Diagnosis not present

## 2015-01-29 DIAGNOSIS — L97811 Non-pressure chronic ulcer of other part of right lower leg limited to breakdown of skin: Secondary | ICD-10-CM | POA: Diagnosis not present

## 2015-01-29 NOTE — Telephone Encounter (Signed)
lmtcb

## 2015-02-03 NOTE — Telephone Encounter (Signed)
Spoke with wife to confirm they were wanting to stop following LINQ monitoring. She states patient is doing well, has had no episodes of syncope/falling since LINQ implant in 2014. When asked if she could send in remote transmission to review, she stated she attempted to drop monitor off several months ago and was told just to throw it away (older remote box). She is aware I will review this with Dr. Caryl Comes and get back with them. She is also aware that it may be end of the month or beginning of September before this is addressed. She is agreeable to current plan.

## 2015-02-06 DIAGNOSIS — L97311 Non-pressure chronic ulcer of right ankle limited to breakdown of skin: Secondary | ICD-10-CM | POA: Diagnosis not present

## 2015-02-06 DIAGNOSIS — L97811 Non-pressure chronic ulcer of other part of right lower leg limited to breakdown of skin: Secondary | ICD-10-CM | POA: Diagnosis not present

## 2015-02-06 DIAGNOSIS — L97412 Non-pressure chronic ulcer of right heel and midfoot with fat layer exposed: Secondary | ICD-10-CM | POA: Diagnosis not present

## 2015-02-06 DIAGNOSIS — E11621 Type 2 diabetes mellitus with foot ulcer: Secondary | ICD-10-CM | POA: Diagnosis not present

## 2015-02-12 ENCOUNTER — Encounter: Payer: Self-pay | Admitting: Cardiology

## 2015-02-19 ENCOUNTER — Encounter: Payer: Self-pay | Admitting: Cardiology

## 2015-02-20 ENCOUNTER — Encounter (HOSPITAL_BASED_OUTPATIENT_CLINIC_OR_DEPARTMENT_OTHER): Payer: Medicare Other | Attending: Internal Medicine

## 2015-02-20 DIAGNOSIS — L97412 Non-pressure chronic ulcer of right heel and midfoot with fat layer exposed: Secondary | ICD-10-CM | POA: Insufficient documentation

## 2015-02-20 DIAGNOSIS — Z872 Personal history of diseases of the skin and subcutaneous tissue: Secondary | ICD-10-CM | POA: Diagnosis not present

## 2015-02-20 DIAGNOSIS — G4730 Sleep apnea, unspecified: Secondary | ICD-10-CM | POA: Diagnosis not present

## 2015-02-20 DIAGNOSIS — J45909 Unspecified asthma, uncomplicated: Secondary | ICD-10-CM | POA: Diagnosis not present

## 2015-02-20 DIAGNOSIS — E11610 Type 2 diabetes mellitus with diabetic neuropathic arthropathy: Secondary | ICD-10-CM | POA: Diagnosis not present

## 2015-02-20 DIAGNOSIS — E11621 Type 2 diabetes mellitus with foot ulcer: Secondary | ICD-10-CM | POA: Diagnosis not present

## 2015-02-20 DIAGNOSIS — M1990 Unspecified osteoarthritis, unspecified site: Secondary | ICD-10-CM | POA: Insufficient documentation

## 2015-02-20 DIAGNOSIS — I10 Essential (primary) hypertension: Secondary | ICD-10-CM | POA: Insufficient documentation

## 2015-02-26 ENCOUNTER — Ambulatory Visit: Payer: Medicare Other | Admitting: Internal Medicine

## 2015-02-26 NOTE — Telephone Encounter (Signed)
Reviewed with Dr. Caryl Comes - informed wife that he does not need to be seen anymore if that is there wishes.   Wife stated that they do not want LINQ removed.  Advised to contact if they would like to be seen again.  She is agreeable.  Will forward information to device clinic.

## 2015-03-04 ENCOUNTER — Encounter: Payer: Self-pay | Admitting: Internal Medicine

## 2015-03-19 DIAGNOSIS — L97412 Non-pressure chronic ulcer of right heel and midfoot with fat layer exposed: Secondary | ICD-10-CM | POA: Diagnosis not present

## 2015-03-19 DIAGNOSIS — E11621 Type 2 diabetes mellitus with foot ulcer: Secondary | ICD-10-CM | POA: Diagnosis not present

## 2015-03-19 DIAGNOSIS — Z872 Personal history of diseases of the skin and subcutaneous tissue: Secondary | ICD-10-CM | POA: Diagnosis not present

## 2015-03-19 DIAGNOSIS — E11610 Type 2 diabetes mellitus with diabetic neuropathic arthropathy: Secondary | ICD-10-CM | POA: Diagnosis not present

## 2015-03-30 ENCOUNTER — Other Ambulatory Visit: Payer: Self-pay | Admitting: Dermatology

## 2015-04-27 IMAGING — CR DG CHEST 1V PORT
1 series · 1 of 1 positions shown · non-contrast
Comparison: 05/18/2011.

CLINICAL DATA: Nausea and irregular heart beat

PORTABLE CHEST - 1 VIEW

[AP]
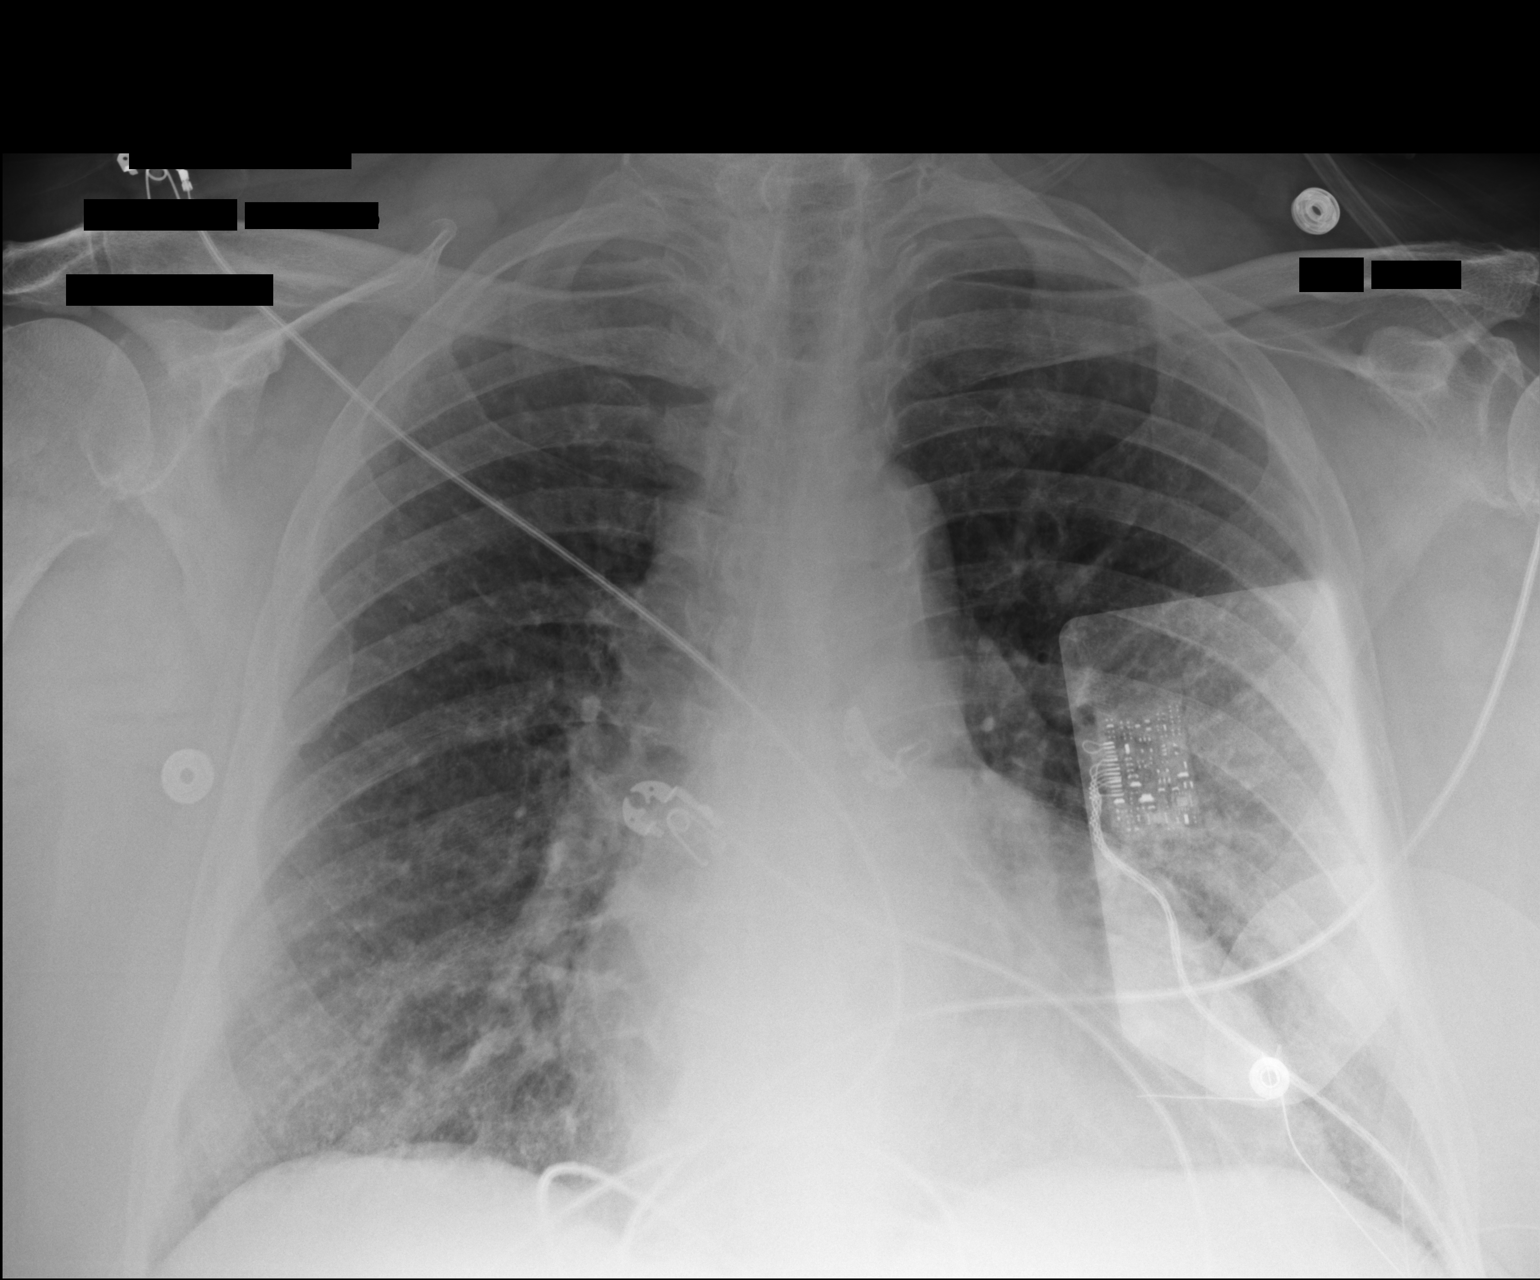

[1 of 1 positions shown; findings below may reference images not displayed]

FINDINGS: There is mild cardiac enlargement.  No pleural effusion
or edema.  Basilar predominant interstitial coarsening is noted
bilaterally.  No airspace consolidation.
IMPRESSION: 1.  No acute cardiopulmonary abnormalities.
2.  Chronic interstitial coarsening.

## 2015-05-21 ENCOUNTER — Other Ambulatory Visit: Payer: Self-pay | Admitting: Dermatology

## 2015-05-28 ENCOUNTER — Encounter: Payer: Self-pay | Admitting: Internal Medicine

## 2015-06-25 ENCOUNTER — Encounter (INDEPENDENT_AMBULATORY_CARE_PROVIDER_SITE_OTHER): Payer: Self-pay | Admitting: Ophthalmology

## 2015-06-26 ENCOUNTER — Telehealth: Payer: Self-pay | Admitting: Internal Medicine

## 2015-06-26 DIAGNOSIS — J309 Allergic rhinitis, unspecified: Secondary | ICD-10-CM | POA: Diagnosis not present

## 2015-06-26 NOTE — Telephone Encounter (Signed)
Allergy Serum Extract Date Mixed: 06/26/15 Vial: 1 Strength: 1:10 Here/Mail/Pick Up: mail Mixed By: tbs Last OV: 02/20/14 Pending OV: 02/26/15 cancelled

## 2015-09-04 ENCOUNTER — Ambulatory Visit (INDEPENDENT_AMBULATORY_CARE_PROVIDER_SITE_OTHER): Payer: Medicare Other | Admitting: Internal Medicine

## 2015-09-04 ENCOUNTER — Encounter: Payer: Self-pay | Admitting: Internal Medicine

## 2015-09-04 VITALS — BP 134/62 | HR 55 | Ht 66.0 in | Wt 299.0 lb

## 2015-09-04 DIAGNOSIS — G4733 Obstructive sleep apnea (adult) (pediatric): Secondary | ICD-10-CM | POA: Diagnosis not present

## 2015-09-04 DIAGNOSIS — J309 Allergic rhinitis, unspecified: Secondary | ICD-10-CM | POA: Diagnosis not present

## 2015-09-04 DIAGNOSIS — J45998 Other asthma: Secondary | ICD-10-CM

## 2015-09-04 DIAGNOSIS — J302 Other seasonal allergic rhinitis: Secondary | ICD-10-CM

## 2015-09-04 DIAGNOSIS — J3089 Other allergic rhinitis: Principal | ICD-10-CM

## 2015-09-04 NOTE — Assessment & Plan Note (Signed)
Tends to snort and throat clear some. Frequent use of fluticasone associated with epistaxis. Plan-recommended saline nasal spray. Wife believes allergy vaccine helps him but he has likely been on it to maximum benefit. I suggested they could continue through this year until I retire but then stop.

## 2015-09-04 NOTE — Patient Instructions (Signed)
Ok to get inhaler refill from your primary physician if needed  We will be stopping allergy vaccine at the end of this year. I think you will do fine with an otc antihistamine like claritin, and with a nasal spray like flonase/ fluticasone if needed

## 2015-09-04 NOTE — Assessment & Plan Note (Signed)
Not a practical candidate for active intervention. He did not tolerate CPAP "aggravating" and has not made lifestyle changes necessary for meaningful weight loss. Continues oxygen 2 L at night for sleep. Pretty much dependent on wife for care because of his dementia.

## 2015-09-04 NOTE — Progress Notes (Signed)
01/13/12- 80 yoM former smoker followed for OSA/ sp UPPP, allergic rhinitis, asthma LOV   04/23/2010         Wife here  Patient does not use a cpap machine anymore. States breathing is good  has trouble falling asleep ant night . States once he is asleep he sleeps ok without waking up.  He missed his last appointment was of hip replacement with no respiratory complications. He says he couldn't keep CPAP on during sleep so he finally gave it back. He and his wife say he is better since his UPPP surgery and she says she only snores "a little bit". Benadryl sometimes helps sleep consolidation. He continues allergy vaccine. Rarely needs an antihistamine. Has a home nebulizer which is rarely used and he  does not use rescue inhaler anymore at all.  01/14/13- 81 yoM former smoker followed for OSA/ sp UPPP, allergic rhinitis, asthma LOV   04/23/2010         Wife here  FOLLOWS FOR: pt wearing o2 2L/ [employer] at night x6-8 wks- pt states he is fatigued and tired thorughout the day causing him to not be able to do much activity. OSA- s/p UPPP, failed CPAP.  Parkinson's drug may be causing somnolence. Wife says he is not snoring much. He sleeps in a recliner. He can't get in and out of bed. Wife can't get him to exercise. He continues allergy vaccine 1:10/wife with no problems. They both say that helps him still, compared to baseline. Leg edema has been worse despite taking 40 mg of Lasix twice daily and he says urine output has decreased. CXR 1v 12/04/12 IMPRESSION:  1. No acute cardiopulmonary abnormalities.  2. Chronic interstitial coarsening.  Original Report Authenticated By: Kerby Moors, M.D.  02/20/14- 38 yoM former smoker followed for OSA/ sp UPPP, allergic rhinitis, asthma FOLLOWS FOR:  has to have the breathing tx every night or he coughs so bad.  still wearing the oxygen 2L/ [employer] at night.   Allergy vaccine 1:10 / wife gives   09/04/2015-82 year old male former smoker followed for OSA/status  post UPPP, allergic rhinitis, asthma O2 2 L/sleep/[employer] Allergy vaccine 1:10/wife gives FOLLOWS FOR: continues using O2 nightly and takes allergy vaccine weekly per wife Wife reports his dementia has gotten bad enough that he can't remember to answer questions appropriately. He is rarely outdoors but may notice nasal congestion if he spends time outside. No acute wheeze or cough. Some throat clearing attributed to postnasal drip.  ROS-see HPI Constitutional:   No-   weight loss, night sweats, fevers, chills,+ fatigue, lassitude. HEENT:   No-  headaches, difficulty swallowing, tooth/dental problems, sore throat,       No-  sneezing, itching, ear ache, nasal congestion, post nasal drip,  CV:  No-   chest pain, orthopnea, PND, swelling in lower extremities, anasarca,  dizziness, palpitations Resp: + shortness of breath with exertion or at rest.              No-   productive cough,  No non-productive cough,  No- coughing up of blood.              No-   change in color of mucus.  No- wheezing.   Skin: No-   rash or lesions. GI:  No-   heartburn, indigestion, abdominal pain, nausea, vomiting,  GU: . MS:  No-   joint pain or swelling.   Neuro-     nothing unusual Psych:  No- change in mood or affect. No  depression or anxiety.  No memory loss.  OBJ- Physical Exam General- Alert, Oriented, Affect-appropriate, Distress- none acute, + obese Skin- + scattered small ecchymoses and erythematous spots Lymphadenopathy- none Head- atraumatic            Eyes- Gross vision intact, PERRLA, conjunctivae and secretions clear            Ears- Hearing, canals-normal            Nose- Clear, no-Septal dev, +mucus bridging, polyps, erosion, perforation             Throat- Mallampati II , mucosa clear , drainage- none, tonsils- atrophic. +throat clearing Neck- flexible , trachea midline, no stridor , thyroid nl, carotid no bruit Chest - symmetrical excursion , unlabored           Heart/CV- RRR , no murmur , no  gallop  , no rub, nl s1 s2                           - JVD- none , edema+2, stasis changes+red stasis dermatitis, varices- none           Lung- clear to P&A, wheeze- none, cough- none , dullness-none, rub- none           Chest wall-  Abd-  Br/ Gen/ Rectal- Not done, not indicated Extrem- cyanosis- none, clubbing, none, atrophy- none, strength- nl. + Wheelchair Neuro- head bob tremor, pill rolling tremor

## 2015-09-04 NOTE — Assessment & Plan Note (Signed)
Mild intermittent uncomplicated. She gives him a nebulizer treatment every night at bedtime and occasionally if needed. Not using rescue inhaler. No recent acute problem.

## 2015-10-12 ENCOUNTER — Emergency Department (HOSPITAL_COMMUNITY)
Admission: EM | Admit: 2015-10-12 | Discharge: 2015-10-13 | Disposition: A | Discharge status: Home / Self Care | Payer: Medicare Other | Attending: Emergency Medicine | Admitting: Emergency Medicine

## 2015-10-12 ENCOUNTER — Encounter (HOSPITAL_COMMUNITY): Payer: Self-pay

## 2015-10-12 DIAGNOSIS — E119 Type 2 diabetes mellitus without complications: Secondary | ICD-10-CM | POA: Diagnosis not present

## 2015-10-12 DIAGNOSIS — L89159 Pressure ulcer of sacral region, unspecified stage: Secondary | ICD-10-CM | POA: Insufficient documentation

## 2015-10-12 DIAGNOSIS — J45909 Unspecified asthma, uncomplicated: Secondary | ICD-10-CM | POA: Diagnosis not present

## 2015-10-12 DIAGNOSIS — I10 Essential (primary) hypertension: Secondary | ICD-10-CM | POA: Insufficient documentation

## 2015-10-12 LAB — I-STAT CG4 LACTIC ACID, ED: Lactic Acid, Venous: 2.32 mmol/L (ref 0.5–2.0)

## 2015-10-12 LAB — CBC WITH DIFFERENTIAL/PLATELET
BASOS PCT: 0 %
Basophils Absolute: 0 10*3/uL (ref 0.0–0.1)
EOS ABS: 0.8 10*3/uL — AB (ref 0.0–0.7)
Eosinophils Relative: 9 %
HCT: 36.4 % — ABNORMAL LOW (ref 39.0–52.0)
HEMOGLOBIN: 12.6 g/dL — AB (ref 13.0–17.0)
Lymphocytes Relative: 16 %
Lymphs Abs: 1.5 10*3/uL (ref 0.7–4.0)
MCH: 29.1 pg (ref 26.0–34.0)
MCHC: 34.6 g/dL (ref 30.0–36.0)
MCV: 84.1 fL (ref 78.0–100.0)
Monocytes Absolute: 0.8 10*3/uL (ref 0.1–1.0)
Monocytes Relative: 9 %
NEUTROS PCT: 66 %
Neutro Abs: 6 10*3/uL (ref 1.7–7.7)
Platelets: 179 10*3/uL (ref 150–400)
RBC: 4.33 MIL/uL (ref 4.22–5.81)
RDW: 13.6 % (ref 11.5–15.5)
WBC: 9 10*3/uL (ref 4.0–10.5)

## 2015-10-12 LAB — COMPREHENSIVE METABOLIC PANEL
ALBUMIN: 3.1 g/dL — AB (ref 3.5–5.0)
ALK PHOS: 68 U/L (ref 38–126)
ALT: 6 U/L — ABNORMAL LOW (ref 17–63)
ANION GAP: 15 (ref 5–15)
AST: 23 U/L (ref 15–41)
BILIRUBIN TOTAL: 0.6 mg/dL (ref 0.3–1.2)
BUN: 55 mg/dL — AB (ref 6–20)
CALCIUM: 8.8 mg/dL — AB (ref 8.9–10.3)
CO2: 30 mmol/L (ref 22–32)
Chloride: 87 mmol/L — ABNORMAL LOW (ref 101–111)
Creatinine, Ser: 1.87 mg/dL — ABNORMAL HIGH (ref 0.61–1.24)
GFR calc Af Amer: 37 mL/min — ABNORMAL LOW (ref >60)
GFR, EST NON AFRICAN AMERICAN: 32 mL/min — AB (ref >60)
GLUCOSE: 331 mg/dL — AB (ref 65–99)
Potassium: 3 mmol/L — ABNORMAL LOW (ref 3.5–5.1)
Sodium: 132 mmol/L — ABNORMAL LOW (ref 135–145)
TOTAL PROTEIN: 7.1 g/dL (ref 6.5–8.1)

## 2015-10-12 NOTE — ED Notes (Signed)
Patient here with ongoing wound to lower buttocks right side near scrotum. Spouse reports ongoing bleeding from same for several days. On assessment area to right buttocks red and oozing. Patient denies pain. Diabetic with minimal mobility

## 2015-10-12 NOTE — ED Notes (Signed)
Dr Wyvonnia Dusky aware of elevated lactic acid results

## 2015-12-30 ENCOUNTER — Other Ambulatory Visit: Payer: Self-pay | Admitting: Dermatology

## 2016-01-27 ENCOUNTER — Other Ambulatory Visit: Payer: Self-pay | Admitting: Dermatology

## 2016-02-25 ENCOUNTER — Other Ambulatory Visit: Payer: Self-pay | Admitting: Dermatology

## 2016-04-21 ENCOUNTER — Other Ambulatory Visit: Payer: Self-pay | Admitting: Dermatology

## 2016-07-12 ENCOUNTER — Other Ambulatory Visit: Payer: Self-pay | Admitting: Dermatology

## 2016-09-22 IMAGING — CR DG FOOT COMPLETE 3+V*R*
3 series · 3 of 3 positions shown · non-contrast
Comparison: None.

CLINICAL DATA: Right plantar wound, evaluate for osteomyelitis

EXAM:
RIGHT FOOT COMPLETE - 3+ VIEW

[view not recorded (1 of 3)]
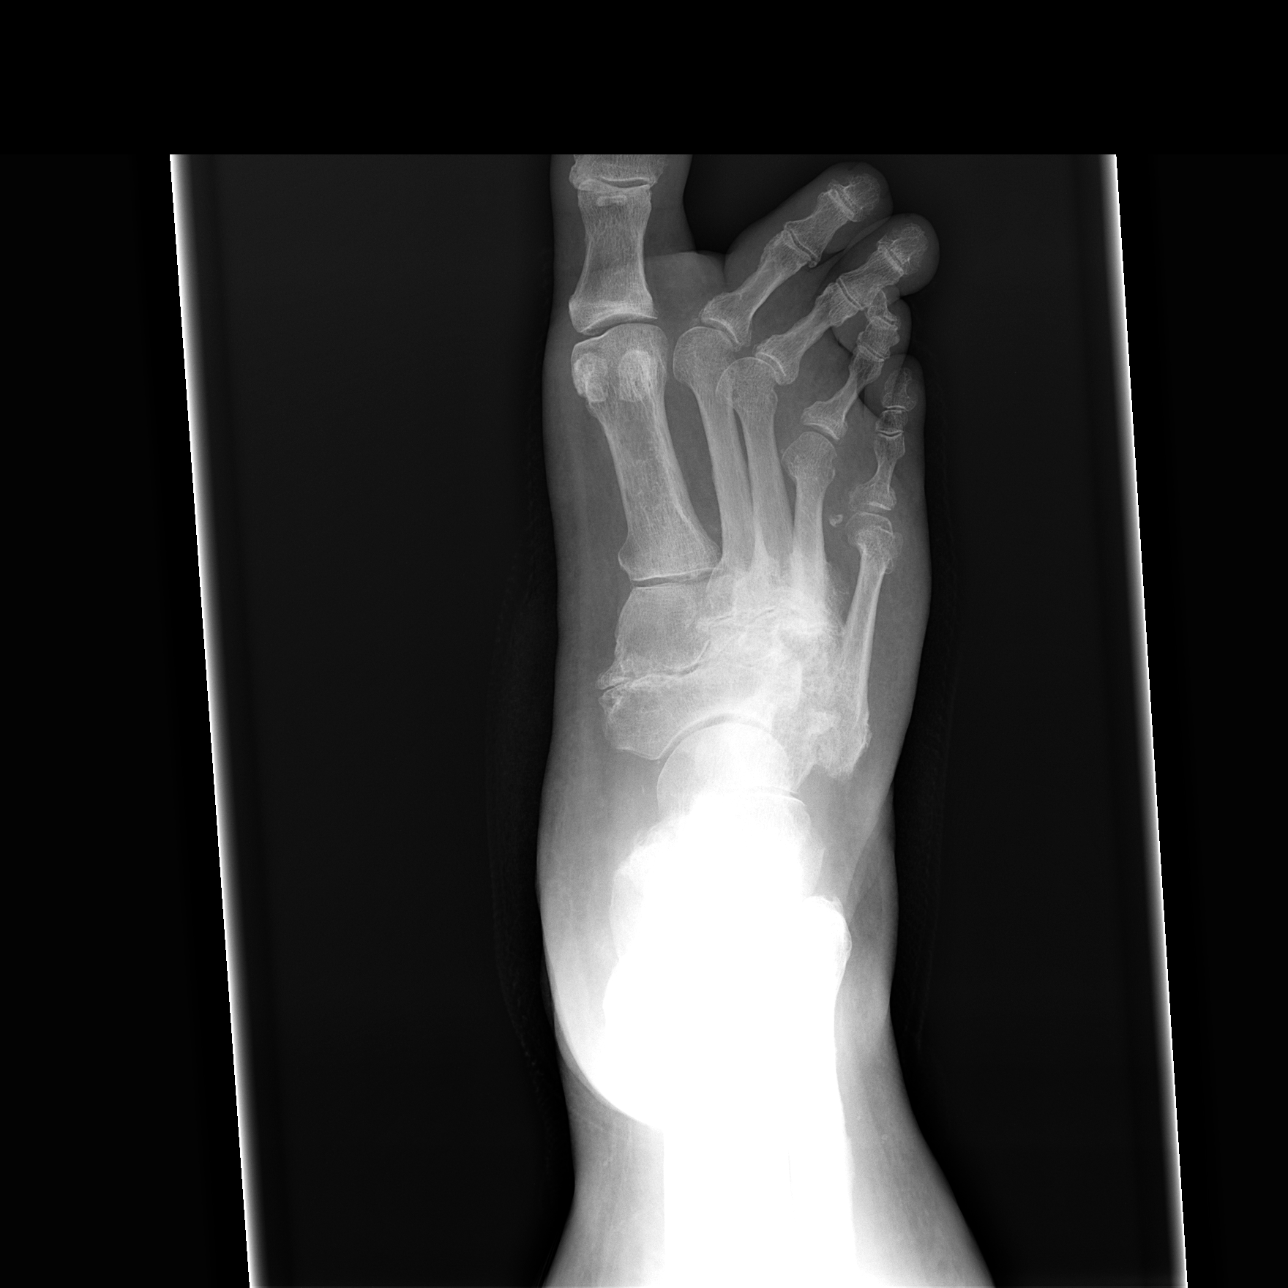

[view not recorded (2 of 3)]
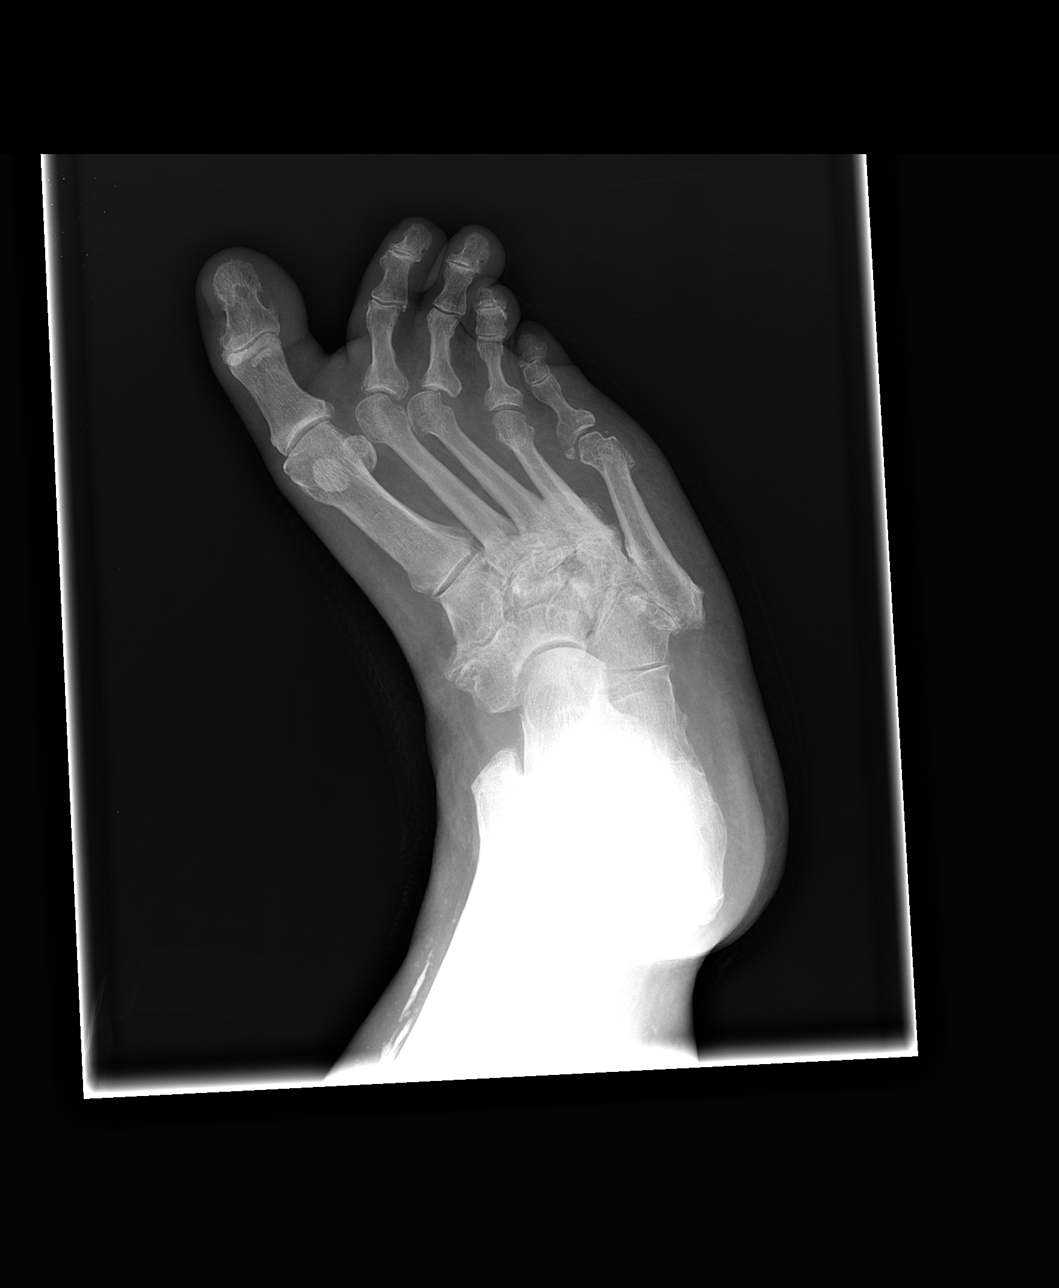

[view not recorded (3 of 3)]
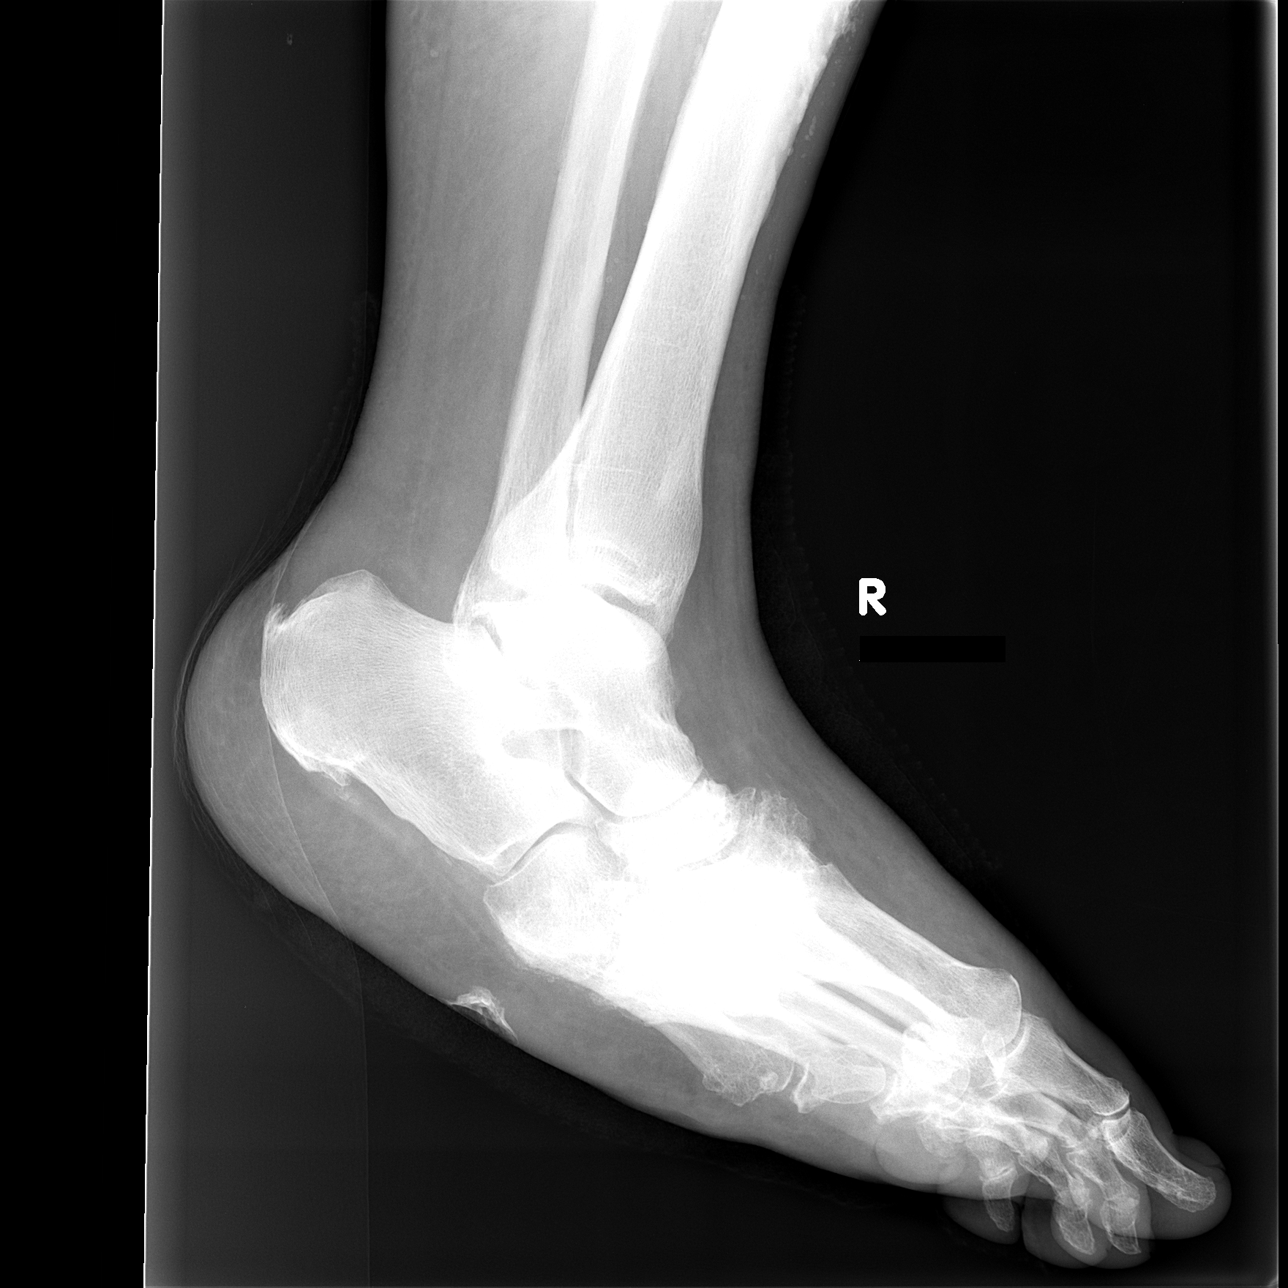

[3 of 3 positions shown; findings below may reference images not displayed]

FINDINGS: Three views of the right foot submitted. There is flatfoot
deformity. Extensive degenerative changes are noted tarsal region
and tarsal metatarsal joints. There is valgus deformity of second
and third toe. Spurring at the base of fifth metatarsal. There is
plantar and posterior spurring of calcaneus. No bone destruction to
suggest osteomyelitis. Soft tissue irregularity noted mid plantar
region. No acute fracture or subluxation.
IMPRESSION: Flatfoot deformity. Extensive osteoarthritic changes. No bone
destruction to suggest osteomyelitis. No acute fracture or
subluxation.

## 2016-10-31 ENCOUNTER — Ambulatory Visit (HOSPITAL_COMMUNITY)
Admission: RE | Admit: 2016-10-31 | Discharge: 2016-10-31 | Disposition: A | Discharge status: Home / Self Care | Payer: Medicare Other | Source: Ambulatory Visit | Attending: Internal Medicine | Admitting: Internal Medicine

## 2016-10-31 ENCOUNTER — Other Ambulatory Visit: Payer: Self-pay | Admitting: Internal Medicine

## 2016-10-31 DIAGNOSIS — X58XXXA Exposure to other specified factors, initial encounter: Secondary | ICD-10-CM | POA: Diagnosis not present

## 2016-10-31 DIAGNOSIS — E11621 Type 2 diabetes mellitus with foot ulcer: Secondary | ICD-10-CM | POA: Diagnosis not present

## 2016-10-31 DIAGNOSIS — S91309A Unspecified open wound, unspecified foot, initial encounter: Secondary | ICD-10-CM | POA: Diagnosis not present

## 2016-10-31 DIAGNOSIS — L97519 Non-pressure chronic ulcer of other part of right foot with unspecified severity: Secondary | ICD-10-CM

## 2016-11-03 ENCOUNTER — Other Ambulatory Visit: Payer: Self-pay | Admitting: Internal Medicine

## 2016-11-03 DIAGNOSIS — E13621 Other specified diabetes mellitus with foot ulcer: Secondary | ICD-10-CM

## 2016-11-03 DIAGNOSIS — L97509 Non-pressure chronic ulcer of other part of unspecified foot with unspecified severity: Principal | ICD-10-CM

## 2016-11-12 ENCOUNTER — Other Ambulatory Visit: Payer: Medicare Other

## 2016-11-21 ENCOUNTER — Other Ambulatory Visit: Payer: Medicare Other

## 2016-12-01 ENCOUNTER — Ambulatory Visit
Admission: RE | Admit: 2016-12-01 | Discharge: 2016-12-01 | Disposition: A | Discharge status: Home / Self Care | Payer: Medicare Other | Source: Ambulatory Visit | Attending: Internal Medicine | Admitting: Internal Medicine

## 2016-12-01 ENCOUNTER — Other Ambulatory Visit: Payer: Self-pay | Admitting: Internal Medicine

## 2016-12-01 DIAGNOSIS — L97509 Non-pressure chronic ulcer of other part of unspecified foot with unspecified severity: Principal | ICD-10-CM

## 2016-12-01 DIAGNOSIS — E13621 Other specified diabetes mellitus with foot ulcer: Secondary | ICD-10-CM

## 2017-01-17 ENCOUNTER — Telehealth: Payer: Self-pay | Admitting: *Deleted

## 2017-01-17 ENCOUNTER — Encounter: Payer: Self-pay | Admitting: Sports Medicine

## 2017-01-17 ENCOUNTER — Ambulatory Visit (INDEPENDENT_AMBULATORY_CARE_PROVIDER_SITE_OTHER): Payer: Medicare Other | Admitting: Sports Medicine

## 2017-01-17 VITALS — BP 122/62 | HR 59 | Temp 98.7°F | Resp 16 | Ht 68.0 in | Wt 303.0 lb

## 2017-01-17 DIAGNOSIS — L97412 Non-pressure chronic ulcer of right heel and midfoot with fat layer exposed: Secondary | ICD-10-CM

## 2017-01-17 DIAGNOSIS — M14679 Charcot's joint, unspecified ankle and foot: Secondary | ICD-10-CM | POA: Diagnosis not present

## 2017-01-17 DIAGNOSIS — I739 Peripheral vascular disease, unspecified: Secondary | ICD-10-CM

## 2017-01-17 DIAGNOSIS — R0989 Other specified symptoms and signs involving the circulatory and respiratory systems: Secondary | ICD-10-CM

## 2017-01-17 DIAGNOSIS — Z8739 Personal history of other diseases of the musculoskeletal system and connective tissue: Secondary | ICD-10-CM | POA: Diagnosis not present

## 2017-01-17 DIAGNOSIS — E08621 Diabetes mellitus due to underlying condition with foot ulcer: Secondary | ICD-10-CM | POA: Diagnosis not present

## 2017-01-17 DIAGNOSIS — L97519 Non-pressure chronic ulcer of other part of right foot with unspecified severity: Secondary | ICD-10-CM

## 2017-01-17 NOTE — Telephone Encounter (Addendum)
-----   Message from Landis Martins, [place] sent at 01/17/2017  3:15 PM EDT ----- Regarding: ABI/PVRs Chronic ulcer right foot. Orders faxed to [facility].

## 2017-01-17 NOTE — Telephone Encounter (Addendum)
-----   Message from Landis Martins, [place] sent at 01/17/2017  3:06 PM EDT ----- Regarding: [facility]  Apply iodoform packing to plantar ulcer and silvadene cream at toes on right. Continue with unna boots. Patient to follow up with Dr. Cannon Kettle in 2 weeks PLEASE [place] ARE DUE TO COME OUT [place]. Orders called to [facility].

## 2017-01-17 NOTE — Progress Notes (Signed)
Subjective: Preston Carter is a 83 y.o. male patient seen in office for evaluation of ulceration of the Right foot. Patient has a history of diabetes and a blood glucose level  Today not recorded. Patient is changing the dressing using Silver pad and unna boots at home with help of nursing. Denies nausea/fever/vomiting/chills/night sweats/shortness of breath/pain. Patient has no other pedal complaints at this time.  Wife reports this history of ulcer x 3 years and that [place] PCP has been helping to care for him and write orders because he went to wound care center in the past with no improvement.  Patient Active Problem List   Diagnosis Date Noted  . Implantable loop recorder--LINQ 05/28/2013  . Fall 12/05/2012  . Sinoatrial node dysfunction ([place]) 12/05/2012  . Chronic kidney disease (CKD), stage III (moderate) 12/05/2012  . COPD-stage 2 05/18/2011  . Hyponatremia 05/14/2011  . Fever 05/14/2011  . Anemia 05/14/2011  . DIABETES MELLITUS 03/04/2008  . EXOGENOUS OBESITY 03/04/2008  . Obstructive sleep apnea 03/04/2008  . Seasonal and perennial allergic rhinitis 03/04/2008  . Allergic-infective asthma 03/04/2008  . PERIPHERAL EDEMA 03/04/2008   Current Outpatient Prescriptions on File Prior to Visit  Medication Sig Dispense Refill  . albuterol (PROVENTIL) (2.5 MG/3ML) 0.083% nebulizer solution As directed    . carbidopa-levodopa-entacapone (STALEVO) 25-100-200 MG per tablet Take 1 tablet by mouth 3 (three) times daily.     . cholecalciferol (VITAMIN D) 1000 UNITS tablet Take 1,000 Units by mouth daily.    . furosemide (LASIX) 40 MG tablet Take 40 mg by mouth 2 (two) times daily.     Marland Kitchen guaiFENesin-dextromethorphan (ROBITUSSIN DM) 100-10 MG/5ML syrup Take 5 mLs by mouth 3 (three) times daily as needed. cough    . ipratropium (ATROVENT) 0.02 % nebulizer solution as needed. As directed    . ipratropium-albuterol (DUONEB) 0.5-2.5 (3) MG/3ML SOLN Take 3 mLs by nebulization every 4  (four) hours as needed. wheezing    . ketoconazole (NIZORAL) 2 % cream Apply 1 application topically daily as needed. itching    . loratadine (CLARITIN) 10 MG tablet Take 10 mg by mouth every morning.     . methylphenidate (RITALIN) 10 MG tablet 10 mg 2 (two) times daily.    . potassium chloride SA (K-DUR,KLOR-CON) 20 MEQ tablet once. Takes 1 tablets oral every day    . simvastatin (ZOCOR) 40 MG tablet Take 40 mg by mouth at bedtime.     . traMADol (ULTRAM) 50 MG tablet Take 50 mg by mouth 3 (three) times daily. Pain       Current Facility-Administered Medications on File Prior to Visit  Medication Dose Route Frequency Provider Last Rate Last Dose  . chlorhexidine (HIBICLENS) 4 % liquid 4 application  60 mL Topical Once Aluisio, Pilar Plate, MD       No Known Allergies  No results found for this or any previous visit (from the past 2160 hour(s)).  Objective: Vitals:   01/17/17 1405  Weight: (!) 303 lb (137.4 kg)  Height: 5\' 8"  (1.727 m)   General: Patient is awake, alert, oriented x 3 and in no acute distress.  Dermatology: Skin is warm and dry bilateral with a full thickness ulceration present right plantar midfoot. Ulceration measures 2.5 cm x 2.5 cm x 0.6 cm. There is a macerated border with a granular base. The ulceration does not  probe to bone. There is no malodor, clear active drainage, no erythema, chronic edema. No other acute signs of infection. + unna wraps on  legs and significant rash/venous skin irritation at enlarged swollen 2nd toe on right with chronic deformity.    Vascular: Dorsalis Pedis pulse = 0/4 Bilateral,  Posterior Tibial pulse = 0/4 Bilateral,  Capillary Fill Time < 5 seconds  Neurologic: Protective sensation absent bilateral.  Musculosketal:  No Pain with palpation to ulcerated area. No pain with compression to calves bilateral.+ Charcot deformity.   Xrays- unable to obtain due to patient status  MRI Right foot 11/2016-  IMPRESSION: Large skin ulceration on  the plantar surface of the foot with marrow edema in the underlying cuboid consistent with osteomyelitis. Marrow edema in the proximal fourth and fifth metatarsals and distal calcaneus is also likely due to osteomyelitis.  Negative for soft tissue abscess.  Subcutaneous edema about the dorsum of the foot and marked skin thickening of the second, third and fourth toes is compatible with cellulitis.  Severe osteoarthritis or Charcot change about the midfoot. First MTP osteoarthritis also noted.  No results for input(s): GRAMSTAIN, LABORGA in the last 8760 hours.  Assessment and Plan:  Problem List Items Addressed This Visit    None    Visit Diagnoses    Diabetic ulcer of right midfoot associated with diabetes mellitus due to underlying condition, with fat layer exposed ([place])    -  Primary   Relevant Orders   WOUND CULTURE   Charcot's joint of ankle, unspecified laterality       PVD (peripheral vascular disease) (HCC)       Diminished pulse       History of osteomyelitis         -Examined patient and discussed the progression of the wound and treatment alternatives with patient, wife, and daughter -MRI reviewed - Cleansed ulceration right foot and wound culture obtained -Applied packing to right plantar ulcera and silvadene cream to toes on right and dry sterile dressing and instructed patient to continue with daily dressings at home consisting of unna boots, silvadene to right toes and packing plantarly; Orders sent to home nurse/[facility] -Continue with wheelchair and custom shoes - Advised patient to go to the ER or return to office if the wound worsens or if constitutional symptoms are present. -Patient to return to office in 2 weeks for follow up care and evaluation or sooner if problems arise. Will call with culture results and determine at this time if he needs referral to ID since patient is not a candidate for amputation due to health.  Landis Martins, DPM

## 2017-01-22 LAB — WOUND CULTURE: GRAM STAIN: NONE SEEN

## 2017-01-24 MED ORDER — CLINDAMYCIN HCL 300 MG PO CAPS: 300.0000 mg | ORAL_CAPSULE | Freq: Three times a day (TID) | ORAL | 0 refills | Status: DC

## 2017-01-24 NOTE — Telephone Encounter (Addendum)
-----   Message from Landis Martins, [place] sent at 01/23/2017 12:20 PM EDT ----- Wound culture + for MRSA. Please send to pharmacy clindamycin 300mg  tid x 10 days. Advise patient to take probiotic or to eat yogurt to protect stomach while on antibiotics -Dr. S.01/24/2017-I informed pt's wife, Pamala Hurry of Dr. Leeanne Rio review of wound culture and orders. Pamala Hurry states understanding.

## 2017-02-03 ENCOUNTER — Encounter (HOSPITAL_COMMUNITY): Payer: Self-pay

## 2017-02-03 ENCOUNTER — Emergency Department (HOSPITAL_COMMUNITY): Payer: Medicare Other

## 2017-02-03 ENCOUNTER — Inpatient Hospital Stay (HOSPITAL_COMMUNITY)
Admission: EM | Admit: 2017-02-03 | Discharge: 2017-02-08 | DRG: 854 | Disposition: A | Discharge status: Skilled Nursing Facility | Payer: Medicare Other | Attending: Internal Medicine | Admitting: Internal Medicine

## 2017-02-03 DIAGNOSIS — N183 Chronic kidney disease, stage 3 unspecified: Secondary | ICD-10-CM | POA: Diagnosis present

## 2017-02-03 DIAGNOSIS — L97509 Non-pressure chronic ulcer of other part of unspecified foot with unspecified severity: Secondary | ICD-10-CM

## 2017-02-03 DIAGNOSIS — Z9981 Dependence on supplemental oxygen: Secondary | ICD-10-CM | POA: Diagnosis not present

## 2017-02-03 DIAGNOSIS — E876 Hypokalemia: Secondary | ICD-10-CM | POA: Diagnosis not present

## 2017-02-03 DIAGNOSIS — Z8249 Family history of ischemic heart disease and other diseases of the circulatory system: Secondary | ICD-10-CM

## 2017-02-03 DIAGNOSIS — E871 Hypo-osmolality and hyponatremia: Secondary | ICD-10-CM | POA: Diagnosis present

## 2017-02-03 DIAGNOSIS — Z96643 Presence of artificial hip joint, bilateral: Secondary | ICD-10-CM | POA: Diagnosis present

## 2017-02-03 DIAGNOSIS — L97419 Non-pressure chronic ulcer of right heel and midfoot with unspecified severity: Secondary | ICD-10-CM | POA: Diagnosis present

## 2017-02-03 DIAGNOSIS — G20 Parkinson's disease: Secondary | ICD-10-CM | POA: Diagnosis present

## 2017-02-03 DIAGNOSIS — I130 Hypertensive heart and chronic kidney disease with heart failure and stage 1 through stage 4 chronic kidney disease, or unspecified chronic kidney disease: Secondary | ICD-10-CM | POA: Diagnosis present

## 2017-02-03 DIAGNOSIS — Z85828 Personal history of other malignant neoplasm of skin: Secondary | ICD-10-CM | POA: Diagnosis not present

## 2017-02-03 DIAGNOSIS — F0280 Dementia in other diseases classified elsewhere without behavioral disturbance: Secondary | ICD-10-CM | POA: Diagnosis present

## 2017-02-03 DIAGNOSIS — I5030 Unspecified diastolic (congestive) heart failure: Secondary | ICD-10-CM | POA: Diagnosis present

## 2017-02-03 DIAGNOSIS — J189 Pneumonia, unspecified organism: Secondary | ICD-10-CM | POA: Diagnosis not present

## 2017-02-03 DIAGNOSIS — D62 Acute posthemorrhagic anemia: Secondary | ICD-10-CM | POA: Diagnosis not present

## 2017-02-03 DIAGNOSIS — Z9049 Acquired absence of other specified parts of digestive tract: Secondary | ICD-10-CM | POA: Diagnosis not present

## 2017-02-03 DIAGNOSIS — Z96653 Presence of artificial knee joint, bilateral: Secondary | ICD-10-CM | POA: Diagnosis present

## 2017-02-03 DIAGNOSIS — J449 Chronic obstructive pulmonary disease, unspecified: Secondary | ICD-10-CM | POA: Diagnosis not present

## 2017-02-03 DIAGNOSIS — E1152 Type 2 diabetes mellitus with diabetic peripheral angiopathy with gangrene: Secondary | ICD-10-CM | POA: Diagnosis present

## 2017-02-03 DIAGNOSIS — Z6841 Body Mass Index (BMI) 40.0 and over, adult: Secondary | ICD-10-CM

## 2017-02-03 DIAGNOSIS — A419 Sepsis, unspecified organism: Secondary | ICD-10-CM | POA: Diagnosis present

## 2017-02-03 DIAGNOSIS — N179 Acute kidney failure, unspecified: Secondary | ICD-10-CM | POA: Diagnosis not present

## 2017-02-03 DIAGNOSIS — M86271 Subacute osteomyelitis, right ankle and foot: Secondary | ICD-10-CM

## 2017-02-03 DIAGNOSIS — L03115 Cellulitis of right lower limb: Secondary | ICD-10-CM | POA: Diagnosis present

## 2017-02-03 DIAGNOSIS — Z66 Do not resuscitate: Secondary | ICD-10-CM | POA: Diagnosis present

## 2017-02-03 DIAGNOSIS — D638 Anemia in other chronic diseases classified elsewhere: Secondary | ICD-10-CM | POA: Diagnosis present

## 2017-02-03 DIAGNOSIS — Z8673 Personal history of transient ischemic attack (TIA), and cerebral infarction without residual deficits: Secondary | ICD-10-CM

## 2017-02-03 DIAGNOSIS — E785 Hyperlipidemia, unspecified: Secondary | ICD-10-CM | POA: Diagnosis present

## 2017-02-03 DIAGNOSIS — Z87891 Personal history of nicotine dependence: Secondary | ICD-10-CM | POA: Diagnosis not present

## 2017-02-03 DIAGNOSIS — M86671 Other chronic osteomyelitis, right ankle and foot: Secondary | ICD-10-CM | POA: Diagnosis present

## 2017-02-03 DIAGNOSIS — L89152 Pressure ulcer of sacral region, stage 2: Secondary | ICD-10-CM | POA: Diagnosis present

## 2017-02-03 DIAGNOSIS — E1169 Type 2 diabetes mellitus with other specified complication: Secondary | ICD-10-CM | POA: Diagnosis present

## 2017-02-03 DIAGNOSIS — Z794 Long term (current) use of insulin: Secondary | ICD-10-CM | POA: Diagnosis not present

## 2017-02-03 DIAGNOSIS — R0600 Dyspnea, unspecified: Secondary | ICD-10-CM | POA: Diagnosis not present

## 2017-02-03 DIAGNOSIS — E11621 Type 2 diabetes mellitus with foot ulcer: Secondary | ICD-10-CM | POA: Diagnosis present

## 2017-02-03 DIAGNOSIS — Z89511 Acquired absence of right leg below knee: Secondary | ICD-10-CM

## 2017-02-03 DIAGNOSIS — I129 Hypertensive chronic kidney disease with stage 1 through stage 4 chronic kidney disease, or unspecified chronic kidney disease: Secondary | ICD-10-CM | POA: Diagnosis not present

## 2017-02-03 DIAGNOSIS — Z7982 Long term (current) use of aspirin: Secondary | ICD-10-CM

## 2017-02-03 DIAGNOSIS — D649 Anemia, unspecified: Secondary | ICD-10-CM | POA: Diagnosis not present

## 2017-02-03 DIAGNOSIS — I96 Gangrene, not elsewhere classified: Secondary | ICD-10-CM | POA: Diagnosis not present

## 2017-02-03 DIAGNOSIS — G250 Essential tremor: Secondary | ICD-10-CM | POA: Diagnosis present

## 2017-02-03 DIAGNOSIS — E875 Hyperkalemia: Secondary | ICD-10-CM | POA: Diagnosis present

## 2017-02-03 DIAGNOSIS — E662 Morbid (severe) obesity with alveolar hypoventilation: Secondary | ICD-10-CM | POA: Diagnosis present

## 2017-02-03 DIAGNOSIS — G309 Alzheimer's disease, unspecified: Secondary | ICD-10-CM | POA: Diagnosis present

## 2017-02-03 DIAGNOSIS — R4182 Altered mental status, unspecified: Secondary | ICD-10-CM | POA: Diagnosis present

## 2017-02-03 DIAGNOSIS — E1122 Type 2 diabetes mellitus with diabetic chronic kidney disease: Secondary | ICD-10-CM | POA: Diagnosis present

## 2017-02-03 DIAGNOSIS — E11628 Type 2 diabetes mellitus with other skin complications: Secondary | ICD-10-CM | POA: Diagnosis present

## 2017-02-03 DIAGNOSIS — E1142 Type 2 diabetes mellitus with diabetic polyneuropathy: Secondary | ICD-10-CM | POA: Diagnosis present

## 2017-02-03 DIAGNOSIS — L97519 Non-pressure chronic ulcer of other part of right foot with unspecified severity: Secondary | ICD-10-CM | POA: Diagnosis present

## 2017-02-03 LAB — I-STAT CG4 LACTIC ACID, ED: LACTIC ACID, VENOUS: 1.67 mmol/L (ref 0.5–1.9)

## 2017-02-03 LAB — COMPREHENSIVE METABOLIC PANEL
ALT: 11 U/L — AB (ref 17–63)
AST: 70 U/L — ABNORMAL HIGH (ref 15–41)
Albumin: 2.6 g/dL — ABNORMAL LOW (ref 3.5–5.0)
Alkaline Phosphatase: 159 U/L — ABNORMAL HIGH (ref 38–126)
Anion gap: 13 (ref 5–15)
BUN: 63 mg/dL — ABNORMAL HIGH (ref 6–20)
CHLORIDE: 86 mmol/L — AB (ref 101–111)
CO2: 30 mmol/L (ref 22–32)
CREATININE: 1.89 mg/dL — AB (ref 0.61–1.24)
Calcium: 8.8 mg/dL — ABNORMAL LOW (ref 8.9–10.3)
GFR calc non Af Amer: 31 mL/min — ABNORMAL LOW (ref >60)
GFR, EST AFRICAN AMERICAN: 36 mL/min — AB (ref >60)
Glucose, Bld: 126 mg/dL — ABNORMAL HIGH (ref 65–99)
Potassium: 3.1 mmol/L — ABNORMAL LOW (ref 3.5–5.1)
Sodium: 129 mmol/L — ABNORMAL LOW (ref 135–145)
Total Bilirubin: 0.7 mg/dL (ref 0.3–1.2)
Total Protein: 7.3 g/dL (ref 6.5–8.1)

## 2017-02-03 LAB — URINALYSIS, ROUTINE W REFLEX MICROSCOPIC
BILIRUBIN URINE: NEGATIVE
GLUCOSE, UA: NEGATIVE mg/dL
HGB URINE DIPSTICK: NEGATIVE
Ketones, ur: NEGATIVE mg/dL
Leukocytes, UA: NEGATIVE
Nitrite: NEGATIVE
PH: 7 (ref 5.0–8.0)
Protein, ur: NEGATIVE mg/dL
SPECIFIC GRAVITY, URINE: 1.01 (ref 1.005–1.030)

## 2017-02-03 LAB — SEDIMENTATION RATE: SED RATE: 116 mm/h — AB (ref 0–16)

## 2017-02-03 LAB — PROTIME-INR
INR: 1.1
PROTHROMBIN TIME: 14.2 s (ref 11.4–15.2)

## 2017-02-03 LAB — GLUCOSE, CAPILLARY: GLUCOSE-CAPILLARY: 152 mg/dL — AB (ref 65–99)

## 2017-02-03 LAB — CBC WITH DIFFERENTIAL/PLATELET
Basophils Absolute: 0 10*3/uL (ref 0.0–0.1)
Basophils Relative: 0 %
Eosinophils Absolute: 0.2 10*3/uL (ref 0.0–0.7)
Eosinophils Relative: 1 %
HEMATOCRIT: 31.2 % — AB (ref 39.0–52.0)
HEMOGLOBIN: 11 g/dL — AB (ref 13.0–17.0)
LYMPHS ABS: 1.1 10*3/uL (ref 0.7–4.0)
LYMPHS PCT: 5 %
MCH: 27.8 pg (ref 26.0–34.0)
MCHC: 35.3 g/dL (ref 30.0–36.0)
MCV: 79 fL (ref 78.0–100.0)
MONOS PCT: 10 %
Monocytes Absolute: 2.1 10*3/uL — ABNORMAL HIGH (ref 0.1–1.0)
NEUTROS ABS: 17.8 10*3/uL — AB (ref 1.7–7.7)
NEUTROS PCT: 84 %
Platelets: 206 10*3/uL (ref 150–400)
RBC: 3.95 MIL/uL — ABNORMAL LOW (ref 4.22–5.81)
RDW: 16.3 % — ABNORMAL HIGH (ref 11.5–15.5)
WBC: 21.3 10*3/uL — ABNORMAL HIGH (ref 4.0–10.5)

## 2017-02-03 LAB — PROCALCITONIN: Procalcitonin: 2.35 ng/mL

## 2017-02-03 LAB — APTT: APTT: 37 s — AB (ref 24–36)

## 2017-02-03 MED ORDER — DEXTROSE 5 % IV SOLN: 500.0000 mg | INTRAVENOUS | Status: DC

## 2017-02-03 MED ORDER — TRAMADOL HCL 50 MG PO TABS: 50.0000 mg | ORAL_TABLET | Freq: Three times a day (TID) | ORAL | Status: DC | PRN

## 2017-02-03 MED ORDER — CEFTRIAXONE SODIUM 2 G IJ SOLR
2.0000 g | INTRAMUSCULAR | Status: DC
  Administered 2017-02-04 – 2017-02-05 (×2): 2 g via INTRAVENOUS
  Filled 2017-02-03 (×3): qty 2

## 2017-02-03 MED ORDER — LORATADINE 10 MG PO TABS
10.0000 mg | ORAL_TABLET | Freq: Two times a day (BID) | ORAL | Status: DC
  Administered 2017-02-05 – 2017-02-08 (×7): 10 mg via ORAL
  Filled 2017-02-03 (×8): qty 1

## 2017-02-03 MED ORDER — ENOXAPARIN SODIUM 60 MG/0.6ML ~~LOC~~ SOLN
60.0000 mg | Freq: Every day | SUBCUTANEOUS | Status: DC
  Administered 2017-02-04 – 2017-02-07 (×4): 60 mg via SUBCUTANEOUS
  Filled 2017-02-03 (×4): qty 0.6

## 2017-02-03 MED ORDER — TRAZODONE HCL 50 MG PO TABS
50.0000 mg | ORAL_TABLET | Freq: Every day | ORAL | Status: DC
  Administered 2017-02-05 – 2017-02-07 (×3): 50 mg via ORAL
  Filled 2017-02-03 (×4): qty 1

## 2017-02-03 MED ORDER — INSULIN ASPART 100 UNIT/ML ~~LOC~~ SOLN
0.0000 [IU] | Freq: Three times a day (TID) | SUBCUTANEOUS | Status: DC
  Administered 2017-02-05: 20 [IU] via SUBCUTANEOUS
  Administered 2017-02-05: 4 [IU] via SUBCUTANEOUS
  Administered 2017-02-06: 7 [IU] via SUBCUTANEOUS
  Administered 2017-02-06: 15 [IU] via SUBCUTANEOUS
  Administered 2017-02-06: 20 [IU] via SUBCUTANEOUS
  Administered 2017-02-07: 7 [IU] via SUBCUTANEOUS
  Administered 2017-02-07 (×2): 4 [IU] via SUBCUTANEOUS

## 2017-02-03 MED ORDER — GUAIFENESIN ER 600 MG PO TB12
600.0000 mg | ORAL_TABLET | Freq: Two times a day (BID) | ORAL | Status: DC
  Administered 2017-02-04 – 2017-02-08 (×7): 600 mg via ORAL
  Filled 2017-02-03 (×9): qty 1

## 2017-02-03 MED ORDER — DEXTROSE 5 % IV SOLN
500.0000 mg | Freq: Once | INTRAVENOUS | Status: AC
  Administered 2017-02-03: 500 mg via INTRAVENOUS
  Filled 2017-02-03: qty 500

## 2017-02-03 MED ORDER — CARBIDOPA-LEVODOPA-ENTACAPONE 25-100-200 MG PO TABS: 1.0000 | ORAL_TABLET | Freq: Three times a day (TID) | ORAL | Status: DC

## 2017-02-03 MED ORDER — VANCOMYCIN HCL 10 G IV SOLR
2500.0000 mg | Freq: Once | INTRAVENOUS | Status: AC
  Administered 2017-02-03: 2500 mg via INTRAVENOUS
  Filled 2017-02-03: qty 2500

## 2017-02-03 MED ORDER — ACETAMINOPHEN 325 MG PO TABS: 650.0000 mg | ORAL_TABLET | Freq: Four times a day (QID) | ORAL | Status: DC | PRN

## 2017-02-03 MED ORDER — SODIUM CHLORIDE 0.9 % IV BOLUS (SEPSIS)
500.0000 mL | Freq: Once | INTRAVENOUS | Status: AC
  Administered 2017-02-03: 500 mL via INTRAVENOUS

## 2017-02-03 MED ORDER — INSULIN DETEMIR 100 UNIT/ML ~~LOC~~ SOLN
45.0000 [IU] | Freq: Every day | SUBCUTANEOUS | Status: DC
  Administered 2017-02-05 – 2017-02-07 (×3): 45 [IU] via SUBCUTANEOUS
  Filled 2017-02-03 (×6): qty 0.45

## 2017-02-03 MED ORDER — POTASSIUM CHLORIDE 10 MEQ/100ML IV SOLN
10.0000 meq | INTRAVENOUS | Status: AC
  Administered 2017-02-04 (×4): 10 meq via INTRAVENOUS
  Filled 2017-02-03 (×4): qty 100

## 2017-02-03 MED ORDER — VANCOMYCIN HCL 10 G IV SOLR
1750.0000 mg | INTRAVENOUS | Status: DC
  Administered 2017-02-05: 1750 mg via INTRAVENOUS
  Filled 2017-02-03: qty 1750

## 2017-02-03 MED ORDER — ONDANSETRON HCL 4 MG PO TABS: 4.0000 mg | ORAL_TABLET | Freq: Four times a day (QID) | ORAL | Status: DC | PRN

## 2017-02-03 MED ORDER — ONDANSETRON HCL 4 MG/2ML IJ SOLN: 4.0000 mg | Freq: Four times a day (QID) | INTRAMUSCULAR | Status: DC | PRN

## 2017-02-03 MED ORDER — DIVALPROEX SODIUM 125 MG PO CSDR
125.0000 mg | DELAYED_RELEASE_CAPSULE | Freq: Two times a day (BID) | ORAL | Status: DC
  Administered 2017-02-05 – 2017-02-08 (×7): 125 mg via ORAL
  Filled 2017-02-03 (×8): qty 1

## 2017-02-03 MED ORDER — DEXTROSE 5 % IV SOLN
1.0000 g | Freq: Once | INTRAVENOUS | Status: AC
  Administered 2017-02-03: 1 g via INTRAVENOUS
  Filled 2017-02-03: qty 10

## 2017-02-03 MED ORDER — ACETAMINOPHEN 650 MG RE SUPP: 650.0000 mg | Freq: Four times a day (QID) | RECTAL | Status: DC | PRN

## 2017-02-03 MED ORDER — SIMVASTATIN 40 MG PO TABS
40.0000 mg | ORAL_TABLET | Freq: Every day | ORAL | Status: DC
  Administered 2017-02-05 – 2017-02-07 (×3): 40 mg via ORAL
  Filled 2017-02-03 (×4): qty 1

## 2017-02-03 MED ORDER — HYDROXYZINE HCL 25 MG PO TABS
25.0000 mg | ORAL_TABLET | Freq: Three times a day (TID) | ORAL | Status: DC
  Administered 2017-02-05 – 2017-02-08 (×10): 25 mg via ORAL
  Filled 2017-02-03 (×11): qty 1

## 2017-02-03 NOTE — ED Triage Notes (Signed)
Pt. Has had fever X2 days. Family gave 1000 mg of PO tylenol at 4:30. Pt. Fail and EMS arrived he was on floor. Per EMS temperature was 100.0. Pt. Has bilateral MRSA infection in legs. Pt can stand with assist. No LOC. Pt. c/o sacral pain per EMS. No blood thinners. Cspine clear. Bp 134/90, HR 78, 98% RA, RR 18, CBG 157 per EMS.

## 2017-02-03 NOTE — ED Notes (Signed)
Before pt's wife left, she reported that she had told the doctor that she gave the pt his night time meds.

## 2017-02-03 NOTE — Progress Notes (Signed)
Pharmacy Antibiotic Note 36 yoM presents with fever. Patient was recently started on clindamycin on 8/7 with plan for 10-day course for right foot ulcer.  Possible pneumonia on CXR.  One time doses of azithromycin 500 mg, ceftriaxone 1g, and vancomycin 2500 mg given in ED.  Pharmacy consulted to continue these on admission.  Plan: Ceftriaxone 2g IV q24h. Azithromycin 500 mg IV q24h. No renal adjustments needed for ceftriaxone and azithromycin so will sign off of these consults and continue following for vancomycin dosing. Vancomycin 1750 mg IV q48h for normalized CrCl~29 ml/min. Daily SCr. Patient's risk factors for vancomycin-induced renal toxicity are obesity and underlying renal disease.  Height: 5\' 11"  (180.3 cm) Weight: (!) 303 lb (137.4 kg) IBW/kg (Calculated) : 75.3  Temp (24hrs), Avg:100 F (37.8 C), Min:100 F (37.8 C), Max:100 F (37.8 C)   Recent Labs Lab 02/03/17 1816 02/03/17 1831  WBC 21.3*  --   CREATININE 1.89*  --   LATICACIDVEN  --  1.67    Estimated Creatinine Clearance: 41.9 mL/min (A) (by C-G formula based on SCr of 1.89 mg/dL (H)).    No Known Allergies  Antimicrobials this admission:  8/17 azithromycin >> 8/17 ceftriaxone >> 8/17 vancomycin >>  Dose adjustments this admission:   Microbiology results:   Thank you for allowing pharmacy to be a part of this patient's care.  Hershal Coria 02/03/2017 9:56 PM

## 2017-02-03 NOTE — ED Notes (Signed)
Bed: PT00 Expected date:  Expected time:  Means of arrival:  Comments: 83 yo fever

## 2017-02-03 NOTE — ED Provider Notes (Signed)
Emergency Department Provider Note   I have reviewed the triage vital signs and the nursing notes.   HISTORY  Chief Complaint Fever   HPI Preston Carter is a 83 y.o. male with PMH of dementia, HTN, TIA, and obesity with recent treatment of right foot ulcer with clindamycin presents to the emergency department for evaluation of fever and fall at home. Family report per EMS a fever for the past 2 days. He was given 1000 g of Tylenol prior to ED presentation. The patient denies any chest pain or difficulty breathing. Denies dysuria. History is limited significantly by the patient's underlying dementia and active infection/fever. No IV fluids given in route. Patient reports being on antibiotics but cannot recall what he was on or how much longer he has to take them. Per review of Epic notes patient was started on clindamycin on 8/7 with plan for 10-day course.   Level 5 caveat: AMS secondary to active infection and underlying dementia.    Past Medical History:  Diagnosis Date  . Anemia 05/14/2011  . Arthritis 05-03-11   most joints- Bil. hips/knees replaced in the past  . Asthma   . DEMENTIA 05-03-11   hx. Alzheimers-tx. Aricept  . Diabetes mellitus 05-03-11   Oral meds used-Dabetes x5 yrs-CBG's (145)  . Hypertension   . Irregular heart beat   . Neuromuscular disorder ([place]) 05-03-11   Dx. Parkinson's -essential tremors-generally, ambulates with walker  . Shortness of breath 05-03-11   SOB with exertion only  . Skin cancer 05-03-11   Past hx. skin cancer lesions-removed  . Stroke [facility]) 05-03-11   TIA- more than 20 yrs ago.    Patient Active Problem List   Diagnosis Date Noted  . Implantable loop recorder--LINQ 05/28/2013  . Fall 12/05/2012  . Sinoatrial node dysfunction ([place]) 12/05/2012  . Chronic kidney disease (CKD), stage III (moderate) 12/05/2012  . COPD-stage 2 05/18/2011  . Hyponatremia 05/14/2011  . Fever 05/14/2011  . Anemia 05/14/2011  . DIABETES MELLITUS  03/04/2008  . EXOGENOUS OBESITY 03/04/2008  . Obstructive sleep apnea 03/04/2008  . Seasonal and perennial allergic rhinitis 03/04/2008  . Allergic-infective asthma 03/04/2008  . PERIPHERAL EDEMA 03/04/2008    Past Surgical History:  Procedure Laterality Date  . CARPAL TUNNEL RELEASE  05-03-11   bilateral  . CHOLECYSTECTOMY    . EYE SURGERY  05-03-11   lt. eye for lazy eye  . JOINT REPLACEMENT  05-03-11   Bil. hip/knee repalcements-now RT. Hip to be revised 05-11-11  . LOOP RECORDER IMPLANT N/A 12/05/2012   Procedure: LOOP RECORDER IMPLANT;  Surgeon: Deboraha Sprang, MD;  Location: [facility] CATH LAB;  Service: Cardiovascular;  Laterality: N/A;  . TONSILLECTOMY    . TOTAL HIP REVISION  05/11/2011   Procedure: TOTAL HIP REVISION;  Surgeon: Dione Plover Aluisio;  Location: [facility] ORS;  Service: Orthopedics;  Laterality: Right;  . UVULOPALATOPHARYNGOPLASTY  05-03-11   x2 . Can.t tolerate cpap machine, doesn't use    Current Outpatient Rx  . Order #: [id number] Class: Historical Med  . Order #: [id number] Class: Historical Med  . Order #: [id number] Class: Historical Med  . Order #: [id number] Class: Historical Med  . Order #: [id number] Class: Historical Med  . Order #: [id number] Class: Historical Med  . Order #: [id number] Class: Historical Med  . Order #: [id number] Class: Historical Med  . Order #: [id number] Class: Historical Med  . Order #: [id number] Class: Historical Med  . Order #: [id number] Class: Historical Med  . Order #: [id number] Class: Historical Med  . Order #:  [id number] Class: Historical Med  . Order #: [id number] Class: Historical Med  . Order #: [id number] Class: Historical Med  . Order #: [id number] Class: Historical Med  . Order #: [id number] Class: Historical Med  . Order #: [id number] Class: Normal    Allergies Patient has no known allergies.  Family History  Problem Relation Age of Onset  . Heart disease Father     Social History Social History  Substance Use Topics  . Smoking status: Former  Smoker    Packs/day: 1.50    Years: 20.00    Quit date: 06/20/1977  . Smokeless tobacco: Never Used  . Alcohol use Not on file    Review of Systems  Level 5 caveat: Dementia and underlying infection.   ____________________________________________   PHYSICAL EXAM:  VITAL SIGNS: ED Triage Vitals  Enc Vitals Group     BP 02/03/17 1717 (!) 133/51     Pulse Rate 02/03/17 1717 77     Resp --      Temp 02/03/17 1717 100 F (37.8 C)     Temp Source 02/03/17 1717 Oral     SpO2 02/03/17 1717 93 %     Weight 02/03/17 1718 (!) 303 lb (137.4 kg)     Height 02/03/17 1718 5\' 11"  (1.803 m)   Constitutional: Alert but oriented only to self.  Eyes: Conjunctivae are normal.  Head: Atraumatic. Nose: No congestion/rhinnorhea. Mouth/Throat: Mucous membranes are dry.  Neck: No stridor.   Cardiovascular: Normal rate, regular rhythm. Good peripheral circulation. Grossly normal heart sounds.   Respiratory: Normal respiratory effort.  No retractions. Lungs CTAB. Gastrointestinal: Soft and nontender. Positive distention/obesity.  Musculoskeletal: No lower extremity tenderness nor edema. No gross deformities of extremities. No thoracic, lumbar, or sacral pain.  Neurologic: No gross focal neurologic deficits are appreciated. Confused with unclear baseline.  Skin:  Skin is warm to touch. No sacral abrasions/ulceration. Erythema over the LLE. Ulceration to the sole of the right foot with packing in place. Skin breakdown between toes on right foot.        ____________________________________________   LABS (all labs ordered are listed, but only abnormal results are displayed)  Labs Reviewed  COMPREHENSIVE METABOLIC PANEL - Abnormal; Notable for the following:       Result Value   Sodium 129 (*)    Potassium 3.1 (*)    Chloride 86 (*)    Glucose, Bld 126 (*)    BUN 63 (*)    Creatinine, Ser 1.89 (*)    Calcium 8.8 (*)    Albumin 2.6 (*)    AST 70 (*)    ALT 11 (*)    Alkaline  Phosphatase 159 (*)    GFR calc non Af Amer 31 (*)    GFR calc Af Amer 36 (*)    All other components within normal limits  CBC WITH DIFFERENTIAL/PLATELET - Abnormal; Notable for the following:    WBC 21.3 (*)    RBC 3.95 (*)    Hemoglobin 11.0 (*)    HCT 31.2 (*)    RDW 16.3 (*)    Neutro Abs 17.8 (*)    Monocytes Absolute 2.1 (*)    All other components within normal limits  URINALYSIS, ROUTINE W REFLEX MICROSCOPIC - Abnormal; Notable for the following:    Color, Urine AMBER (*)    All other components within normal limits  CULTURE, BLOOD (ROUTINE X 2)  CULTURE, BLOOD (ROUTINE X 2)  I-STAT CG4 LACTIC ACID, ED  I-STAT CG4 LACTIC ACID, ED   ____________________________________________  RADIOLOGY  Dg Chest 2 View  Result Date: 02/03/2017 CLINICAL DATA:  83 y/o  M; 2 days of fever. EXAM: CHEST  2 VIEW COMPARISON:  12/04/2012 chest radiograph FINDINGS: Diffuse coarse reticular opacities of the lungs predominantly at the periphery with patchy opacities in the lower lobes best seen on the lateral radiograph. Mild cardiomegaly. Aortic atherosclerosis with calcification. No pleural effusion or pneumothorax. No acute osseous abnormality is identified. IMPRESSION: Diffuse reticular opacities and patchy bilateral lower lobe opacities may represent pulmonary edema or multifocal pneumonia. No pleural effusion. Aortic atherosclerosis. Mild cardiomegaly. Electronically Signed   By: Kristine Garbe M.D.   On: 02/03/2017 18:12   Dg Foot 2 Views Right  Result Date: 02/03/2017 CLINICAL DATA:  Clinical concern for osteomyelitis.  Fever. EXAM: RIGHT FOOT - 2 VIEW COMPARISON:  Radiographs 10/31/2016, foot MRI 12/01/2016 FINDINGS: Large plantar skin/soft tissue defect. No radiopaque foreign body. Question of increased periosteal reaction about proximal fourth metatarsal. No other is no evidence of bony destructive change. Charcot changes in the midfoot. Radiographic evaluation for osteomyelitis in  this setting is limited. Chronic lateral subluxation of the second and third digits with associated soft tissue edema. Marked dorsal soft tissue edema. IMPRESSION: 1. Large plantar skin/soft tissue defect. 2. Questionable periosteal reaction about the proximal fourth metatarsal, increased from prior exam. Osteomyelitis was seen in this area on prior MRI. No additional radiographic findings of osteomyelitis. 3. Chronic Charcot changes in the midfoot, second and third digit subluxation and multifocal soft tissue edema. Electronically Signed   By: Jeb Levering M.D.   On: 02/03/2017 20:02    ____________________________________________   PROCEDURES  Procedure(s) performed:   Procedures  CRITICAL CARE Performed by: Margette Fast Total critical care time: 35 minutes Critical care time was exclusive of separately billable procedures and treating other patients. Critical care was necessary to treat or prevent imminent or life-threatening deterioration. Critical care was time spent personally by me on the following activities: development of treatment plan with patient and/or surrogate as well as nursing, discussions with consultants, evaluation of patient's response to treatment, examination of patient, obtaining history from patient or surrogate, ordering and performing treatments and interventions, ordering and review of laboratory studies, ordering and review of radiographic studies, pulse oximetry and re-evaluation of patient's condition.  Nanda Quinton, MD Emergency Medicine  ____________________________________________   INITIAL IMPRESSION / ASSESSMENT AND PLAN / ED COURSE  Pertinent labs & imaging results that were available during my care of the patient were reviewed by me and considered in my medical decision making (see chart for details).  Patient presents to the emergency room in for evaluation of fever over the last 2 days. Patient lives at home with wife. History limited by  baseline dementia and acute infectious process. Just completed Clindamycin course. Sending sepsis labs and start with gentle IVF.   07:32 PM Patient with significant leukocytosis. Normal lactate. Chest x-ray shows multifocal pneumonia. Patient with borderline low oxygen saturation. This could be source of fever and CAO coverage started previously. Patient also has significant cellulitis of his right lower extremity and chronic wounds between toes and on the sole of the foot. Family feels this looks more red than normal. Took his last dose of clindamycin today with wound culture recently growing MRSA. Adding Vancomycin and will send for plain film to evaluate for osteomyelitis.   Sepsis - Repeat Assessment  Performed at:   08:24 PM  02/03/2017   Vitals     Blood pressure (!) 118/58, pulse 66,  temperature 100 F (37.8 C), temperature source Oral, resp. rate 15, height 5\' 11"  (1.803 m), weight (!) 137.4 kg (303 lb), SpO2 92 %.  Heart:     Regular rate and rhythm  Lungs:    CTA  Capillary Refill:   <2 sec  Peripheral Pulse:   Radial pulse palpable  Skin:     Normal Color   Discussed patient's case with Hospitalist.  Recommend admission. Patient and family (if present) updated with plan. Care transferred to Hospitalist service.  I reviewed all nursing notes, vitals, pertinent old records, EKGs, labs, imaging (as available).  ____________________________________________  FINAL CLINICAL IMPRESSION(S) / ED DIAGNOSES  Final diagnoses:  Cellulitis of right lower extremity  Community acquired pneumonia, unspecified laterality  Sepsis, due to unspecified organism [facility])     MEDICATIONS GIVEN DURING THIS VISIT:  Medications  vancomycin (VANCOCIN) 2,500 mg in sodium chloride 0.9 % 500 mL IVPB (not administered)  sodium chloride 0.9 % bolus 500 mL (500 mLs Intravenous New Bag/Given 02/03/17 1827)  cefTRIAXone (ROCEPHIN) 1 g in dextrose 5 % 50 mL IVPB (1 g Intravenous New Bag/Given 02/03/17  1835)  azithromycin (ZITHROMAX) 500 mg in dextrose 5 % 250 mL IVPB (500 mg Intravenous New Bag/Given 02/03/17 1834)     NEW OUTPATIENT MEDICATIONS STARTED DURING THIS VISIT:  None   Note:  This document was prepared using Dragon voice recognition software and may include unintentional dictation errors.  Nanda Quinton, MD Emergency Medicine    Long, [facility], MD 02/03/17 2325

## 2017-02-03 NOTE — Progress Notes (Addendum)
A consult was received from an ED physician for azithromycin, ceftriaxone, vancomcyin per pharmacy dosing.  The patient's profile has been reviewed for ht/wt/allergies/indication/available labs.   A one time order has been placed for azithromycin 500 mg, ceftriaxone 1g, vancomycin 2500 mg.  Further antibiotics/pharmacy consults should be ordered by admitting physician if indicated.                       Thank you, Hershal Coria, PharmD, BCPS Pager: [phone] 02/03/2017 6:33 PM

## 2017-02-03 NOTE — H&P (Addendum)
History and Physical    Preston Carter JJO:[id number] DOB: 23-Mar-1932 DOA: 02/03/2017  Referring MD/NP/PA:Joshua Long PCP: Leanna Battles, MD  Patient coming from: Home  Chief Complaint: Fever  HPI: Preston Carter is a 83 y.o. male with medical history significant of Parkinson's disease, dementia, HTN, DM type II, H/O MRSA infections, and TIA/stroke; who presents with complaints of fever off and on for the last two weeks.  History is obtained from the patient's wife was present at bedside. Today fever got up as high as 101.4F.He has been dealing with right foot ulcer for quite some time. Patient had recently completed a 10 day course of clindamycin for right foot ulcer given by Dr. Cannon Kettle of podiatry. Dr. Cannon Kettle had also scheduled him ABI testing on the 24th of this month. He had also recently checked a MRI which showed signs of osteomyelitis. He has been having wound care come out twice a week for some unknown time period to wrap his legs and monitor the foot ulcer that has been present for 3 years. Wife states that it has been chronically draining, although the odor may have worsened recently. Patient has a chronic cough that is productive, but has been worse in the last 2 days. Associated symptoms include decreased appetite, worsened tremor, generalized weakness, and 2 falls in last week.   ED Course: Upon admission into the emergency department patient was noted to be febrile to 100F, pulse 60 -104, respirations 15-26, and all vital signs are relatively maintained. Patient was given 500 mL of normal saline IV fluids, vancomycin, ceftriaxone, and azithromycin for sepsis thought to possibly be related to a right diabetic foot ulcer and possibly pneumonia.  Review of Systems: Review of Systems  Unable to perform ROS: Dementia  Constitutional: Positive for fever and malaise/fatigue.  HENT: Positive for congestion and hearing loss.   Eyes: Negative for photophobia, pain and discharge.    Respiratory: Positive for cough, sputum production and wheezing. Negative for shortness of breath.   Cardiovascular: Negative for chest pain and palpitations.  Gastrointestinal: Negative for abdominal pain, blood in stool and vomiting.  Genitourinary: Negative for frequency and urgency.  Musculoskeletal: Positive for falls and joint pain.  Neurological: Positive for weakness. Negative for focal weakness and seizures.  Endo/Heme/Allergies: Positive for environmental allergies.  Psychiatric/Behavioral: Positive for memory loss.    Past Medical History:  Diagnosis Date  . Anemia 05/14/2011  . Arthritis 05-03-11   most joints- Bil. hips/knees replaced in the past  . Asthma   . DEMENTIA 05-03-11   hx. Alzheimers-tx. Aricept  . Diabetes mellitus 05-03-11   Oral meds used-Dabetes x5 yrs-CBG's (145)  . Hypertension   . Irregular heart beat   . Neuromuscular disorder ([place]) 05-03-11   Dx. Parkinson's -essential tremors-generally, ambulates with walker  . Shortness of breath 05-03-11   SOB with exertion only  . Skin cancer 05-03-11   Past hx. skin cancer lesions-removed  . Stroke [facility]) 05-03-11   TIA- more than 20 yrs ago.    Past Surgical History:  Procedure Laterality Date  . CARPAL TUNNEL RELEASE  05-03-11   bilateral  . CHOLECYSTECTOMY    . EYE SURGERY  05-03-11   lt. eye for lazy eye  . JOINT REPLACEMENT  05-03-11   Bil. hip/knee repalcements-now RT. Hip to be revised 05-11-11  . LOOP RECORDER IMPLANT N/A 12/05/2012   Procedure: LOOP RECORDER IMPLANT;  Surgeon: Deboraha Sprang, MD;  Location: [facility] CATH LAB;  Service: Cardiovascular;  Laterality: N/A;  .  TONSILLECTOMY    . TOTAL HIP REVISION  05/11/2011   Procedure: TOTAL HIP REVISION;  Surgeon: Dione Plover Aluisio;  Location: [facility] ORS;  Service: Orthopedics;  Laterality: Right;  . UVULOPALATOPHARYNGOPLASTY  05-03-11   x2 . Can.t tolerate cpap machine, doesn't use     reports that he quit smoking about 39 years ago. He has a 30.00  pack-year smoking history. He has never used smokeless tobacco. His alcohol and drug histories are not on file.  No Known Allergies  Family History  Problem Relation Age of Onset  . Heart disease Father     Prior to Admission medications   Medication Sig Start Date End Date Taking? Authorizing Provider  albuterol (PROVENTIL) (2.5 MG/3ML) 0.083% nebulizer solution Take 2.5 mg by nebulization every 4 (four) hours as needed. As directed 05/06/13  Yes [provider]  aspirin EC 81 MG tablet Take 81 mg by mouth daily.   Yes [provider]  carbidopa-levodopa-entacapone (STALEVO) 25-100-200 MG per tablet Take 1 tablet by mouth 3 (three) times daily.    Yes [provider]  divalproex (DEPAKOTE SPRINKLE) 125 MG capsule Take 125 mg by mouth 2 (two) times daily. 11/18/16  Yes [provider]  furosemide (LASIX) 40 MG tablet Take 40 mg by mouth 2 (two) times daily.    Yes [provider]  guaiFENesin-dextromethorphan (ROBITUSSIN DM) 100-10 MG/5ML syrup Take 5 mLs by mouth 3 (three) times daily as needed. cough   Yes [provider]  HUMALOG 100 UNIT/ML injection Inject 16 Units into the skin 2 (two) times daily. 01/01/17  Yes [provider]  hydrOXYzine (ATARAX/VISTARIL) 25 MG tablet Take 25 mg by mouth 3 (three) times daily.   Yes [provider]  ipratropium (ATROVENT) 0.02 % nebulizer solution Take 0.5 mg by nebulization every 4 (four) hours as needed for wheezing. As directed 05/14/13  Yes [provider]  LEVEMIR FLEXTOUCH 100 UNIT/ML Pen Inject 45-54 Units into the skin 2 (two) times daily. Inject 54 u in the am and 45 u in the evening 10/28/16  Yes [provider]  loratadine (CLARITIN) 10 MG tablet Take 10 mg by mouth 2 (two) times daily.    Yes [provider]  Omega-3 Fatty Acids (FISH OIL PO) Take 2 capsules by mouth daily.   Yes [provider]  potassium chloride SA (K-DUR,KLOR-CON) 20  MEQ tablet Take 20 mEq by mouth daily. Takes 1 tablets oral every day   Yes [provider]  simvastatin (ZOCOR) 40 MG tablet Take 40 mg by mouth at bedtime.    Yes [provider]  spironolactone (ALDACTONE) 25 MG tablet Take 25 mg by mouth daily.   Yes [provider]  traMADol (ULTRAM) 50 MG tablet Take 50 mg by mouth 3 (three) times daily. Pain     Yes [provider]  traZODone (DESYREL) 50 MG tablet Take 50 mg by mouth at bedtime.   Yes [provider]  clindamycin (CLEOCIN) 300 MG capsule Take 1 capsule (300 mg total) by mouth 3 (three) times daily. Patient not taking: Reported on 02/03/2017 01/24/17   Landis Martins, DPM    Physical Exam:  Constitutional: Elderly male who appears chronically ill Vitals:   02/03/17 1717 02/03/17 1718 02/03/17 1830  BP: (!) 133/51  (!) 118/58  Pulse: 77  66  Resp:   15  Temp: 100 F (37.8 C)    TempSrc: Oral    SpO2: 93%  92%  Weight:  Marland Kitchen)  137.4 kg (303 lb)   Height:  5\' 11"  (1.803 m)    Eyes: PERRL, lids and conjunctivae normal ENMT: Mucous membranes are dry. Posterior pharynx clear of any exudate or lesions.   Neck: normal, supple, no masses, no thyromegaly Respiratory:Decreased overall aeration with Cardiovascular: Regular rate and rhythm, no murmurs / rubs / gallops. No extremity edema. 2+ pedal pulses. No carotid bruits.  Abdomen: no tenderness, no masses palpated. No hepatosplenomegaly. Bowel sounds positive.  Musculoskeletal: no clubbing / cyanosis. No joint deformity upper and lower extremities. poor range of motion. Skin: Multiple areas of bruising on upper and lower extremities with skin breakdown noted. 1.5 inch ulcer present of the right mid foot with purulent drainage and foul odor. Neurologic: CN 2-12 grossly intact. Resting tremor present Psychiatric: Dementia. Alert and oriented x To self. Side effect.  Labs on Admission: I have personally reviewed following labs and imaging  studies  CBC:  Recent Labs Lab 02/03/17 1816  WBC 21.3*  NEUTROABS 17.8*  HGB 11.0*  HCT 31.2*  MCV 79.0  PLT 622   Basic Metabolic Panel:  Recent Labs Lab 02/03/17 1816  NA 129*  K 3.1*  CL 86*  CO2 30  GLUCOSE 126*  BUN 63*  CREATININE 1.89*  CALCIUM 8.8*   GFR: Estimated Creatinine Clearance: 41.9 mL/min (A) (by C-G formula based on SCr of 1.89 mg/dL (H)). Liver Function Tests:  Recent Labs Lab 02/03/17 1816  AST 70*  ALT 11*  ALKPHOS 159*  BILITOT 0.7  PROT 7.3  ALBUMIN 2.6*   No results for input(s): LIPASE, AMYLASE in the last 168 hours. No results for input(s): AMMONIA in the last 168 hours. Coagulation Profile: No results for input(s): INR, PROTIME in the last 168 hours. Cardiac Enzymes: No results for input(s): CKTOTAL, CKMB, CKMBINDEX, TROPONINI in the last 168 hours. BNP (last 3 results) No results for input(s): PROBNP in the last 8760 hours. HbA1C: No results for input(s): HGBA1C in the last 72 hours. CBG: No results for input(s): GLUCAP in the last 168 hours. Lipid Profile: No results for input(s): CHOL, HDL, LDLCALC, TRIG, CHOLHDL, LDLDIRECT in the last 72 hours. Thyroid Function Tests: No results for input(s): TSH, T4TOTAL, FREET4, T3FREE, THYROIDAB in the last 72 hours. Anemia Panel: No results for input(s): VITAMINB12, FOLATE, FERRITIN, TIBC, IRON, RETICCTPCT in the last 72 hours. Urine analysis:    Component Value Date/Time   COLORURINE AMBER (A) 02/03/2017 1824   APPEARANCEUR CLEAR 02/03/2017 1824   LABSPEC 1.010 02/03/2017 1824   PHURINE 7.0 02/03/2017 1824   GLUCOSEU NEGATIVE 02/03/2017 1824   HGBUR NEGATIVE 02/03/2017 1824   BILIRUBINUR NEGATIVE 02/03/2017 1824   KETONESUR NEGATIVE 02/03/2017 1824   PROTEINUR NEGATIVE 02/03/2017 1824   UROBILINOGEN 0.2 05/14/2011 1000   NITRITE NEGATIVE 02/03/2017 1824   LEUKOCYTESUR NEGATIVE 02/03/2017 1824   Sepsis Labs: No results found for this or any previous visit (from the past  240 hour(s)).   Radiological Exams on Admission: Dg Chest 2 View  Result Date: 02/03/2017 CLINICAL DATA:  83 y/o  M; 2 days of fever. EXAM: CHEST  2 VIEW COMPARISON:  12/04/2012 chest radiograph FINDINGS: Diffuse coarse reticular opacities of the lungs predominantly at the periphery with patchy opacities in the lower lobes best seen on the lateral radiograph. Mild cardiomegaly. Aortic atherosclerosis with calcification. No pleural effusion or pneumothorax. No acute osseous abnormality is identified. IMPRESSION: Diffuse reticular opacities and patchy bilateral lower lobe opacities may represent pulmonary edema or multifocal pneumonia. No pleural effusion. Aortic  atherosclerosis. Mild cardiomegaly. Electronically Signed   By: Kristine Garbe M.D.   On: 02/03/2017 18:12   Dg Foot 2 Views Right  Result Date: 02/03/2017 CLINICAL DATA:  Clinical concern for osteomyelitis.  Fever. EXAM: RIGHT FOOT - 2 VIEW COMPARISON:  Radiographs 10/31/2016, foot MRI 12/01/2016 FINDINGS: Large plantar skin/soft tissue defect. No radiopaque foreign body. Question of increased periosteal reaction about proximal fourth metatarsal. No other is no evidence of bony destructive change. Charcot changes in the midfoot. Radiographic evaluation for osteomyelitis in this setting is limited. Chronic lateral subluxation of the second and third digits with associated soft tissue edema. Marked dorsal soft tissue edema. IMPRESSION: 1. Large plantar skin/soft tissue defect. 2. Questionable periosteal reaction about the proximal fourth metatarsal, increased from prior exam. Osteomyelitis was seen in this area on prior MRI. No additional radiographic findings of osteomyelitis. 3. Chronic Charcot changes in the midfoot, second and third digit subluxation and multifocal soft tissue edema. Electronically Signed   By: Jeb Levering M.D.   On: 02/03/2017 20:02    Independently reviewed chest x-ray:  Appears to show significant opacities of  the bilateral lower lobes suggestive of pneumonia also on the differential includes pulmonary edema from CHF.  Assessment/Plan Sepsis 2/2 right diabetic foot wound and/or community-acquired pneumonia: Acute. Patient with worsening diabetic foot wound not responding to outpatient antibiotics of clindamycin. Also noted to have possible multifocal pneumonia on chest x-ray. Patient placed on empiric antibiotics to treat either possible causes of symptoms. - Admit to a telemetry bed - Follow up blood and sputum cultures - Continue empiric antibiotics of ceftriaxone, azithromycin, and vancomycin - Check BNP - Check ABI - Wound care consult - May want to consult ID in a.m. as patient podiatrist noted patient to be a poor candidate for amputation due to his health.  Diabetes mellitus type 2 - Hypoglycemic protocols - Check hemoglobin A1c in a.m - Continue home regimen of Levemir  - CBGs with resistant SSI  Hypokalemia: Acute. Initial potassium 3.1 on admission. - Give 40 mEq of potassium chloride 1 dose now  - Continue home K Dur 20 mEq daily - Adjust dosage as needed  Chronic kidney disease stage III: Stable. Patient's baseline creatinine appears to be around 1.8  - Continue to monitor  Parkinson's disease - Continue Stalevo  Hyperlipidemia - Continue simvastatin   COPD, without acute exacerbation - Continuous pulse oximetry with nasal cannula oxygen as needed  Anemia of chronic disease: Chronic. Initial hemoglobin of on admission was 11 which appears near patient's baseline. - Repeat CBC in a.m.  Hyponatremia: Acute on chronic. Patient presents with a sodium of 129. - IV fluids as seen above and recheck BMP in a.m.  DVT prophylaxis: lovenox Code Status: Full Family Communication: Discussed plan of care with patient and family present at bedside   Disposition Plan: To be determined Consults called: none  Admission status: Inpatient   Norval Morton MD [facility] Pager [phone]   If 7PM-7AM, please contact night-coverage [email or web address] Password [facility]  02/03/2017, 8:45 PM

## 2017-02-04 ENCOUNTER — Inpatient Hospital Stay (HOSPITAL_COMMUNITY): Payer: Medicare Other

## 2017-02-04 DIAGNOSIS — E876 Hypokalemia: Secondary | ICD-10-CM | POA: Diagnosis present

## 2017-02-04 DIAGNOSIS — L97509 Non-pressure chronic ulcer of other part of unspecified foot with unspecified severity: Secondary | ICD-10-CM

## 2017-02-04 DIAGNOSIS — N179 Acute kidney failure, unspecified: Secondary | ICD-10-CM | POA: Diagnosis present

## 2017-02-04 DIAGNOSIS — Z794 Long term (current) use of insulin: Secondary | ICD-10-CM

## 2017-02-04 DIAGNOSIS — L03115 Cellulitis of right lower limb: Secondary | ICD-10-CM

## 2017-02-04 DIAGNOSIS — I96 Gangrene, not elsewhere classified: Secondary | ICD-10-CM | POA: Insufficient documentation

## 2017-02-04 DIAGNOSIS — A419 Sepsis, unspecified organism: Principal | ICD-10-CM

## 2017-02-04 DIAGNOSIS — E871 Hypo-osmolality and hyponatremia: Secondary | ICD-10-CM

## 2017-02-04 DIAGNOSIS — M86271 Subacute osteomyelitis, right ankle and foot: Secondary | ICD-10-CM

## 2017-02-04 DIAGNOSIS — R0600 Dyspnea, unspecified: Secondary | ICD-10-CM

## 2017-02-04 DIAGNOSIS — E11621 Type 2 diabetes mellitus with foot ulcer: Secondary | ICD-10-CM | POA: Diagnosis present

## 2017-02-04 DIAGNOSIS — M869 Osteomyelitis, unspecified: Secondary | ICD-10-CM | POA: Insufficient documentation

## 2017-02-04 LAB — COMPREHENSIVE METABOLIC PANEL
ALT: 14 U/L — AB (ref 17–63)
ANION GAP: 12 (ref 5–15)
AST: 77 U/L — ABNORMAL HIGH (ref 15–41)
Albumin: 2.5 g/dL — ABNORMAL LOW (ref 3.5–5.0)
Alkaline Phosphatase: 154 U/L — ABNORMAL HIGH (ref 38–126)
BUN: 58 mg/dL — ABNORMAL HIGH (ref 6–20)
CHLORIDE: 89 mmol/L — AB (ref 101–111)
CO2: 30 mmol/L (ref 22–32)
CREATININE: 1.67 mg/dL — AB (ref 0.61–1.24)
Calcium: 8.8 mg/dL — ABNORMAL LOW (ref 8.9–10.3)
GFR, EST AFRICAN AMERICAN: 42 mL/min — AB (ref >60)
GFR, EST NON AFRICAN AMERICAN: 36 mL/min — AB (ref >60)
Glucose, Bld: 60 mg/dL — ABNORMAL LOW (ref 65–99)
POTASSIUM: 3.6 mmol/L (ref 3.5–5.1)
SODIUM: 131 mmol/L — AB (ref 135–145)
Total Bilirubin: 1 mg/dL (ref 0.3–1.2)
Total Protein: 7.1 g/dL (ref 6.5–8.1)

## 2017-02-04 LAB — ECHOCARDIOGRAM COMPLETE
HEIGHTINCHES: 71 in
Weight: 4828.8 oz

## 2017-02-04 LAB — BLOOD GAS, ARTERIAL
ACID-BASE EXCESS: 7.5 mmol/L — AB (ref 0.0–2.0)
Bicarbonate: 31 mmol/L — ABNORMAL HIGH (ref 20.0–28.0)
Drawn by: 10552
O2 CONTENT: 2 L/min
O2 SAT: 97.1 %
PATIENT TEMPERATURE: 98.6
pCO2 arterial: 39.8 mmHg (ref 32.0–48.0)
pH, Arterial: 7.503 — ABNORMAL HIGH (ref 7.350–7.450)
pO2, Arterial: 91.6 mmHg (ref 83.0–108.0)

## 2017-02-04 LAB — CBC
HCT: 31.2 % — ABNORMAL LOW (ref 39.0–52.0)
Hemoglobin: 10.9 g/dL — ABNORMAL LOW (ref 13.0–17.0)
MCH: 27.8 pg (ref 26.0–34.0)
MCHC: 34.9 g/dL (ref 30.0–36.0)
MCV: 79.6 fL (ref 78.0–100.0)
PLATELETS: 210 10*3/uL (ref 150–400)
RBC: 3.92 MIL/uL — ABNORMAL LOW (ref 4.22–5.81)
RDW: 16.7 % — AB (ref 11.5–15.5)
WBC: 20 10*3/uL — ABNORMAL HIGH (ref 4.0–10.5)

## 2017-02-04 LAB — GLUCOSE, CAPILLARY
GLUCOSE-CAPILLARY: 58 mg/dL — AB (ref 65–99)
Glucose-Capillary: 99 mg/dL (ref 65–99)
Glucose-Capillary: 82 mg/dL (ref 65–99)
Glucose-Capillary: 98 mg/dL (ref 65–99)
Glucose-Capillary: 115 mg/dL — ABNORMAL HIGH (ref 65–99)
Glucose-Capillary: 136 mg/dL — ABNORMAL HIGH (ref 65–99)

## 2017-02-04 LAB — MAGNESIUM: MAGNESIUM: 2.2 mg/dL (ref 1.7–2.4)

## 2017-02-04 LAB — SURGICAL PCR SCREEN
MRSA, PCR: NEGATIVE
STAPHYLOCOCCUS AUREUS: POSITIVE — AB

## 2017-02-04 LAB — C-REACTIVE PROTEIN: CRP: 27.5 mg/dL — AB (ref <1.0)

## 2017-02-04 LAB — HEMOGLOBIN A1C
HEMOGLOBIN A1C: 7.9 % — AB (ref 4.8–5.6)
MEAN PLASMA GLUCOSE: 180.03 mg/dL

## 2017-02-04 LAB — BRAIN NATRIURETIC PEPTIDE: B NATRIURETIC PEPTIDE 5: 481.5 pg/mL — AB (ref 0.0–100.0)

## 2017-02-04 LAB — PREALBUMIN: Prealbumin: 9.5 mg/dL — ABNORMAL LOW (ref 18–38)

## 2017-02-04 MED ORDER — ENTACAPONE 200 MG PO TABS
200.0000 mg | ORAL_TABLET | Freq: Three times a day (TID) | ORAL | Status: DC
  Administered 2017-02-05 – 2017-02-08 (×10): 200 mg via ORAL
  Filled 2017-02-04 (×16): qty 1

## 2017-02-04 MED ORDER — ASPIRIN EC 81 MG PO TBEC
81.0000 mg | DELAYED_RELEASE_TABLET | Freq: Every day | ORAL | Status: DC
  Administered 2017-02-05 – 2017-02-08 (×4): 81 mg via ORAL
  Filled 2017-02-04 (×4): qty 1

## 2017-02-04 MED ORDER — DAKINS (1/4 STRENGTH) 0.125 % EX SOLN
Freq: Two times a day (BID) | CUTANEOUS | Status: AC
  Administered 2017-02-04 – 2017-02-06 (×2)
  Filled 2017-02-04 (×2): qty 473

## 2017-02-04 MED ORDER — POVIDONE-IODINE 10 % EX SWAB: 2.0000 "application " | Freq: Once | CUTANEOUS | Status: DC

## 2017-02-04 MED ORDER — CHLORHEXIDINE GLUCONATE 4 % EX LIQD
60.0000 mL | Freq: Once | CUTANEOUS | Status: DC
  Filled 2017-02-04 (×2): qty 60

## 2017-02-04 MED ORDER — CEFAZOLIN SODIUM 10 G IJ SOLR
3.0000 g | INTRAMUSCULAR | Status: AC
  Administered 2017-02-05: 3 g via INTRAVENOUS
  Filled 2017-02-04 (×2): qty 3000

## 2017-02-04 MED ORDER — POTASSIUM CHLORIDE CRYS ER 20 MEQ PO TBCR
20.0000 meq | EXTENDED_RELEASE_TABLET | Freq: Every day | ORAL | Status: DC
  Administered 2017-02-05: 20 meq via ORAL
  Filled 2017-02-04: qty 1

## 2017-02-04 MED ORDER — FUROSEMIDE 40 MG PO TABS: 40.0000 mg | ORAL_TABLET | Freq: Two times a day (BID) | ORAL | Status: DC

## 2017-02-04 MED ORDER — DEXTROSE 50 % IV SOLN
INTRAVENOUS | Status: AC
  Administered 2017-02-04: 25 mL
  Filled 2017-02-04: qty 50

## 2017-02-04 MED ORDER — CARBIDOPA-LEVODOPA 25-100 MG PO TABS
1.0000 | ORAL_TABLET | Freq: Three times a day (TID) | ORAL | Status: DC
  Administered 2017-02-05 – 2017-02-08 (×10): 1 via ORAL
  Filled 2017-02-04 (×10): qty 1

## 2017-02-04 MED ORDER — SPIRONOLACTONE 25 MG PO TABS
25.0000 mg | ORAL_TABLET | Freq: Every day | ORAL | Status: DC
  Administered 2017-02-05 – 2017-02-08 (×4): 25 mg via ORAL
  Filled 2017-02-04 (×4): qty 1

## 2017-02-04 MED ORDER — ALBUMIN HUMAN 25 % IV SOLN
25.0000 g | Freq: Four times a day (QID) | INTRAVENOUS | Status: AC
  Administered 2017-02-04 – 2017-02-05 (×4): 25 g via INTRAVENOUS
  Filled 2017-02-04: qty 50
  Filled 2017-02-04 (×4): qty 100

## 2017-02-04 MED ORDER — INSULIN DETEMIR 100 UNIT/ML ~~LOC~~ SOLN
54.0000 [IU] | Freq: Every day | SUBCUTANEOUS | Status: DC
  Administered 2017-02-06 – 2017-02-08 (×3): 54 [IU] via SUBCUTANEOUS
  Filled 2017-02-04 (×6): qty 0.54

## 2017-02-04 NOTE — Consult Note (Addendum)
[facility] Nurse wound consult note Reason for Consult: Sepsis due to nonhealing neuropathic ulcer to right plantar midfoot. Charcot changes to right midfoot.  Cellulitis to right foot. HAs UNNA boot on left leg and no open lesions, per wife.  In place at this time. Wound type:Infectious, nonhealing neuropathic ulcer Stage 2  pressure injury to left buttocks Pressure Injury POA: Yes Measurement: buttocks:  2 cm x 2 cm x 0.2 cm  Erythema and scabbing to second and third metatarsals.  Erythematous blister to right dorsal foot Right plantar foot 4 cm x 3 cm x 2 cm (unable to visualize depth due to presence of adherent slough) Wound WFU:XNAT and moist buttocks 100% devitalized tissue to plantar foot-has tried Santyl in the past with no effect.  Drainage (amount, consistency, odor) moderate purulence to foot Periwound:erythema and edema Dressing procedure/placement/frequency: Cleanse wound to right plantar foot with Ns and pat gently dry.  Apply Dakin's moist gauze for debridement and odor control. Change twice daily  Silicone border foam to buttocks.  Will not follow at this time.  Please re-consult if needed.  Domenic Moras RN BSN [place] Pager [phone]

## 2017-02-04 NOTE — Progress Notes (Signed)
Patient from [facility], is A&Ox1-2, Pt has multiple skin issues, Bilateral Upper extremities are have multiple bruises, LUE has a large skin flap by the ac, has Tegaderm to the site, another skin tear noted proximal to that with Tegaderm to that area also. RUE has an apparent infiltrate in the forearm, proximal this is an IV located in the RT ac. Patient has stage 2 wound to bilateral buttocks the size of a quarter to each buttock, the bilateral inner thighs both have unstageable wounds adjacent to the scrotum.. The LLE has a wrapped Lower leg from knee to toes, and the RLE has a gangrenous foot, it is wrapped in Kerlix  Dressing. Patient is awaiting RLE amputation surgery in the AM.

## 2017-02-04 NOTE — Progress Notes (Addendum)
[facility] PROGRESS NOTE    Progress Note  Preston Carter  NLZ:[id number] DOB: [birth year] DOA: 02/03/2017 PCP: Leanna Battles, MD     Brief Narrative:   Preston Carter is an 83 y.o. male past medical history of Parkinson disease, COPD on home O2, Essential hypertension diabetes mellitus type 2 who presents with fever intermittently for the last 2 weeks in the ED it was 101.3, he had recently completed a 10 day course of clindamycin for right foot ulcer, he recently had an MRI that shows signs of osteomyelitis  Assessment/Plan:   Sepsis due to  Type 2 diabetes mellitus with foot ulcer/wet gangrene and osteomyelitis of the right foot: Failed outpatient treatment with clindamycin. Start empiric antibiotics IV vancomycin and Rocephin, d/c azithromycin. Cultures are pending. A1c is pending, started on Levemir and sliding scale. Wound Care consulted for sacral decub. He is third spacing which makes his physical exam and evaluation of that right foot and left foot complicated. ABI are pending, discussed the case with orthopedic surgery they recommended to transfer the patient to [facility] for surgical amputation. Patient has limited mobility, with his multiple wounds. We'll have to discuss with him and his wife about end-of-life. I will consult palliative care, long-term care plan about his care. Repeat 2-D echo. Cont SSI cbg's q 4hrs.  Multiple Sacral decubitus ulcer stage II: Consult wound care.  Electrolyte imbalance Hyperkalemia/Hyponatremia: Improved with oral repletion. KVO IV fluid he seems to be 3rd spacing, he possible has diastolic heart failure.  OSA: C-Pap at night.  Normocytic  Anemia: Hemoglobin seems to be at baseline.  Parkinson's disease: Continue Stalevo.  COPD-stage 2 without acute exacerbation: Continue oxygen seems to be at baseline.   DVT prophylaxis: lovenox Family Communication:none Disposition Plan/Barrier to D/C: Transfer to [facility] Code Status:      Code Status Orders        Start     Ordered   02/03/17 2141  Full code  Continuous     02/03/17 2146    Code Status History    Date Active Date Inactive Code Status Order ID Comments User Context   This patient has a current code status but no historical code status.    Advance Directive Documentation     Most Recent Value  Type of Advance Directive  Healthcare Power of Attorney  Pre-existing out of facility DNR order (yellow form or pink MOST form)  -  "MOST" Form in Place?  -        IV Access:    Peripheral IV   Procedures and diagnostic studies:   Dg Chest 2 View  Result Date: 02/03/2017 CLINICAL DATA:  83 y/o  M; 2 days of fever. EXAM: CHEST  2 VIEW COMPARISON:  12/04/2012 chest radiograph FINDINGS: Diffuse coarse reticular opacities of the lungs predominantly at the periphery with patchy opacities in the lower lobes best seen on the lateral radiograph. Mild cardiomegaly. Aortic atherosclerosis with calcification. No pleural effusion or pneumothorax. No acute osseous abnormality is identified. IMPRESSION: Diffuse reticular opacities and patchy bilateral lower lobe opacities may represent pulmonary edema or multifocal pneumonia. No pleural effusion. Aortic atherosclerosis. Mild cardiomegaly. Electronically Signed   By: Kristine Garbe M.D.   On: 02/03/2017 18:12   Dg Foot 2 Views Right  Result Date: 02/03/2017 CLINICAL DATA:  Clinical concern for osteomyelitis.  Fever. EXAM: RIGHT FOOT - 2 VIEW COMPARISON:  Radiographs 10/31/2016, foot MRI 12/01/2016 FINDINGS: Large plantar skin/soft tissue defect. No radiopaque foreign body. Question of increased  periosteal reaction about proximal fourth metatarsal. No other is no evidence of bony destructive change. Charcot changes in the midfoot. Radiographic evaluation for osteomyelitis in this setting is limited. Chronic lateral subluxation of the second and third digits with associated soft tissue edema. Marked dorsal soft  tissue edema. IMPRESSION: 1. Large plantar skin/soft tissue defect. 2. Questionable periosteal reaction about the proximal fourth metatarsal, increased from prior exam. Osteomyelitis was seen in this area on prior MRI. No additional radiographic findings of osteomyelitis. 3. Chronic Charcot changes in the midfoot, second and third digit subluxation and multifocal soft tissue edema. Electronically Signed   By: Jeb Levering M.D.   On: 02/03/2017 20:02     Medical Consultants:    None.  Anti-Infectives:   IV vancomycin and Rocephin.  Subjective:    Gar P[place] patient is laying in bed minimally verbally with no complains.  Objective:    Vitals:   02/03/17 2149 02/03/17 2200 02/04/17 0001 02/04/17 0455  BP: 131/63 124/69 (!) 113/54 130/70  Pulse: (!) 104 88 92 91  Resp: (!) 25 (!) 26 20 20   Temp:   98.3 F (36.8 C) 97.8 F (36.6 C)  TempSrc:   Oral Oral  SpO2: 90% 97% 95% 98%  Weight:   132.9 kg (293 lb) (!) 136.9 kg (301 lb 12.8 oz)  Height:   5\' 11"  (1.803 m)     Intake/Output Summary (Last 24 hours) at 02/04/17 0713 Last data filed at 02/04/17 0600  Gross per 24 hour  Intake             1600 ml  Output                0 ml  Net             1600 ml   Filed Weights   02/03/17 1718 02/04/17 0001 02/04/17 0455  Weight: (!) 137.4 kg (303 lb) 132.9 kg (293 lb) (!) 136.9 kg (301 lb 12.8 oz)    Exam: General exam: No acute distress. Respiratory system: Good air movement with crackles at the bases. Cardiovascular system: Regular rate and rhythm with positive S1-S2 no murmurs rubs gallops. Gastrointestinal system: Positive bowel sounds soft nontender nondistended. Central nervous system: Alert and oriented 3, minimally verbal, he now worries that he knows it's summer. But he is back to sleep. Extremities: 3+ lower extremity edema. Skin: Multiple ulcers on the heel plantar aspect of his right foot, stage I decubitus area perineal area.   Data Reviewed:     Labs: Basic Metabolic Panel:  Recent Labs Lab 02/03/17 1816 02/04/17 0601  NA 129* 131*  K 3.1* 3.6  CL 86* 89*  CO2 30 30  GLUCOSE 126* 60*  BUN 63* 58*  CREATININE 1.89* 1.67*  CALCIUM 8.8* 8.8*  MG  --  2.2   GFR Estimated Creatinine Clearance: 47.4 mL/min (A) (by C-G formula based on SCr of 1.67 mg/dL (H)). Liver Function Tests:  Recent Labs Lab 02/03/17 1816 02/04/17 0601  AST 70* 77*  ALT 11* 14*  ALKPHOS 159* 154*  BILITOT 0.7 1.0  PROT 7.3 7.1  ALBUMIN 2.6* 2.5*   No results for input(s): LIPASE, AMYLASE in the last 168 hours. No results for input(s): AMMONIA in the last 168 hours. Coagulation profile  Recent Labs Lab 02/03/17 2215  INR 1.10    CBC:  Recent Labs Lab 02/03/17 1816  WBC 21.3*  NEUTROABS 17.8*  HGB 11.0*  HCT 31.2*  MCV 79.0  PLT 206  Cardiac Enzymes: No results for input(s): CKTOTAL, CKMB, CKMBINDEX, TROPONINI in the last 168 hours. BNP (last 3 results) No results for input(s): PROBNP in the last 8760 hours. CBG:  Recent Labs Lab 02/03/17 2359  GLUCAP 152*   D-Dimer: No results for input(s): DDIMER in the last 72 hours. Hgb A1c: No results for input(s): HGBA1C in the last 72 hours. Lipid Profile: No results for input(s): CHOL, HDL, LDLCALC, TRIG, CHOLHDL, LDLDIRECT in the last 72 hours. Thyroid function studies: No results for input(s): TSH, T4TOTAL, T3FREE, THYROIDAB in the last 72 hours.  Invalid input(s): FREET3 Anemia work up: No results for input(s): VITAMINB12, FOLATE, FERRITIN, TIBC, IRON, RETICCTPCT in the last 72 hours. Sepsis Labs:  Recent Labs Lab 02/03/17 1816 02/03/17 1831 02/03/17 2215  PROCALCITON  --   --  2.35  WBC 21.3*  --   --   LATICACIDVEN  --  1.67  --    Microbiology No results found for this or any previous visit (from the past 240 hour(s)).   Medications:   . aspirin EC  81 mg Oral Daily  . carbidopa-levodopa  1 tablet Oral TID WC  . divalproex  125 mg Oral BID  .  enoxaparin (LOVENOX) injection  60 mg Subcutaneous QHS  . entacapone  200 mg Oral TID WC  . furosemide  40 mg Oral BID  . guaiFENesin  600 mg Oral BID  . hydrOXYzine  25 mg Oral TID  . insulin aspart  0-20 Units Subcutaneous TID WC  . insulin detemir  45 Units Subcutaneous QHS  . insulin detemir  54 Units Subcutaneous Daily  . loratadine  10 mg Oral BID  . potassium chloride SA  20 mEq Oral Daily  . simvastatin  40 mg Oral QHS  . spironolactone  25 mg Oral Daily  . traZODone  50 mg Oral QHS   Continuous Infusions: . azithromycin    . cefTRIAXone (ROCEPHIN)  IV    . [START ON 02/05/2017] vancomycin        LOS: 1 day   Charlynne Cousins  [facility] Pager [phone]  *Please refer to [email or web address], password [email or web address] to get updated schedule on who will round on this patient, as hospitalists switch teams weekly. If 7PM-7AM, please contact night-coverage at [email or web address], password [email or web address] for any overnight needs.  02/04/2017, 7:13 AM

## 2017-02-04 NOTE — Progress Notes (Signed)
*  PRELIMINARY RESULTS* Echocardiogram 2D Echocardiogram has been performed.  [name] 02/04/2017, 11:50 AM

## 2017-02-04 NOTE — Anesthesia Preprocedure Evaluation (Addendum)
Anesthesia Evaluation  Patient identified by MRN, date of birth, ID band Patient awake    Reviewed: Allergy & Precautions, NPO status , Patient's Chart, lab work & pertinent test results  Airway Mallampati: III  TM Distance: >3 FB Neck ROM: Full    Dental   Pulmonary asthma , sleep apnea , COPD, former smoker,    breath sounds clear to auscultation       Cardiovascular hypertension, Pt. on medications  Rhythm:Regular Rate:Normal     Neuro/Psych PSYCHIATRIC DISORDERS  Neuromuscular disease CVA    GI/Hepatic negative GI ROS, Neg liver ROS,   Endo/Other  diabetes, Type 2, Insulin DependentMorbid obesity  Renal/GU CRFRenal disease     Musculoskeletal  (+) Arthritis ,   Abdominal   Peds  Hematology  (+) anemia ,   Anesthesia Other Findings   Reproductive/Obstetrics                            Lab Results  Component Value Date   WBC 20.0 (H) 02/04/2017   HGB 10.9 (L) 02/04/2017   HCT 31.2 (L) 02/04/2017   MCV 79.6 02/04/2017   PLT 210 02/04/2017   Lab Results  Component Value Date   CREATININE 1.67 (H) 02/04/2017   BUN 58 (H) 02/04/2017   NA 131 (L) 02/04/2017   K 3.6 02/04/2017   CL 89 (L) 02/04/2017   CO2 30 02/04/2017    Anesthesia Physical Anesthesia Plan  ASA: III  Anesthesia Plan: General   Post-op Pain Management:  Regional for Post-op pain   Induction: Intravenous  PONV Risk Score and Plan: 2 and Ondansetron, Dexamethasone and Treatment may vary due to age or medical condition  Airway Management Planned: LMA  Additional Equipment:   Intra-op Plan:   Post-operative Plan: Extubation in OR  Informed Consent: I have reviewed the patients History and Physical, chart, labs and discussed the procedure including the risks, benefits and alternatives for the proposed anesthesia with the patient or authorized representative who has indicated his/her understanding and  acceptance.   Dental advisory given  Plan Discussed with: CRNA  Anesthesia Plan Comments:        Anesthesia Quick Evaluation

## 2017-02-04 NOTE — Evaluation (Signed)
Physical Therapy Evaluation Patient Details Name: Preston Carter MRN: [id number] DOB: 02/08/1932 Today's Date: 02/04/2017   History of Present Illness  83 yo male admitted with sepsis. Hx of R foot ulcer, dementia, Parkinson's, DM, MRSA, TIA, CVA, frequent falls.   Clinical Impression  Bed level eval only on today. Pt is severely weak-unable to attempt bed mobility at this time. Family present during session. Did not discuss d/c plan on today however based on current presentation, recommendation will be for SNF placement. Will follow and progress activity as able.     Follow Up Recommendations SNF    Equipment Recommendations  None recommended by PT    Recommendations for Other Services       Precautions / Restrictions Precautions Precautions: Fall Precaution Comments: multiple wounds over entire body Restrictions Weight Bearing Restrictions: No      Mobility  Bed Mobility               General bed mobility comments: NT-pt too weak  Transfers                    Ambulation/Gait                Stairs            Wheelchair Mobility    Modified Rankin (Stroke Patients Only)       Balance                                             Pertinent Vitals/Pain Pain Assessment: Faces Faces Pain Scale: No hurt    Home Living Family/patient expects to be discharged to:: Unsure Living Arrangements: Spouse/significant other Available Help at Discharge: Family;Available 24 hours/day Type of Home: House Home Access: Ramped entrance;Stairs to enter   Entrance Stairs-Number of Steps: 1 Home Layout: One level Home Equipment: Wheelchair - power;Wheelchair - [employer] - 2 wheels;Shower seat;Bedside commode (lift chair)      Prior Function Level of Independence: Needs assistance   Gait / Transfers Assistance Needed: non ambulatory for ~ 1 month. Transfers only.   ADL's / Homemaking Assistance Needed: aide assist with  bathing.         Hand Dominance        Extremity/Trunk Assessment   Upper Extremity Assessment Upper Extremity Assessment: Generalized weakness. Fair grip strength.    Lower Extremity Assessment Lower Extremity Assessment: RLE deficits/detail;LLE deficits/detail RLE Deficits / Details: Strength ~2/5 throughout except foot/ankle NT. Dressing on R foot.  LLE Deficits / Details: Strength ~2/5 throughout       Communication   Communication: No difficulties  Cognition Arousal/Alertness: Awake/alert Behavior During Therapy: WFL for tasks assessed/performed Overall Cognitive Status: History of cognitive impairments - at baseline                                        General Comments      Exercises     Assessment/Plan    PT Assessment Patient needs continued PT services  PT Problem List Decreased strength;Decreased mobility;Decreased range of motion;Decreased activity tolerance;Decreased balance;Decreased knowledge of use of DME;Decreased cognition       PT Treatment Interventions DME instruction;Therapeutic activities;Therapeutic exercise;Patient/family education;Balance training;Functional mobility training    PT Goals (Current goals can be found in the Care Plan  section)  Acute Rehab PT Goals Patient Stated Goal: per wife, for pt to get stronger PT Goal Formulation: With family Time For Goal Achievement: 02/18/17 Potential to Achieve Goals: Fair    Frequency Min 3X/week   Barriers to discharge        Co-evaluation               AM-PAC PT "6 Clicks" Daily Activity  Outcome Measure Difficulty turning over in bed (including adjusting bedclothes, sheets and blankets)?: Unable Difficulty moving from lying on back to sitting on the side of the bed? : Unable Difficulty sitting down on and standing up from a chair with arms (e.g., wheelchair, bedside commode, etc,.)?: Unable Help needed moving to and from a bed to chair (including a  wheelchair)?: Total Help needed walking in hospital room?: Total Help needed climbing 3-5 steps with a railing? : Total 6 Click Score: 6    End of Session   Activity Tolerance: Patient limited by fatigue Patient left: in bed;with call bell/phone within reach;with family/visitor present   PT Visit Diagnosis: Muscle weakness (generalized) (M62.81);Difficulty in walking, not elsewhere classified (R26.2)    Time: 0964-3838 PT Time Calculation (min) (ACUTE ONLY): 8 min   Charges:   PT Evaluation $PT Eval Moderate Complexity: 1 Mod     PT G Codes:          Weston Anna, MPT Pager: [phone]

## 2017-02-04 NOTE — Consult Note (Signed)
ORTHOPAEDIC CONSULTATION  REQUESTING PHYSICIAN: Charlynne Cousins, MD  Chief Complaint: cellulitis pain chronic ulcer right foot  HPI: Preston Carter is a 83 y.o. male who presents with sepsis. Patient states that he has had 2 weeks of fevers at home. Patient fell today and was unable to get up. Patient has had wound care provided by Dr. Sharlett Iles and has had compression wraps for the venous insufficiency left lower extremity by advanced home care at home with twice a week dressing changes. Patient has been on enzymatic debriding ointments as well as oral antibiotics.  Past Medical History:  Diagnosis Date  . Anemia 05/14/2011  . Arthritis 05-03-11   most joints- Bil. hips/knees replaced in the past  . Asthma   . DEMENTIA 05-03-11   hx. Alzheimers-tx. Aricept  . Diabetes mellitus 05-03-11   Oral meds used-Dabetes x5 yrs-CBG's (145)  . Hypertension   . Irregular heart beat   . Neuromuscular disorder ([place]) 05-03-11   Dx. Parkinson's -essential tremors-generally, ambulates with walker  . Shortness of breath 05-03-11   SOB with exertion only  . Skin cancer 05-03-11   Past hx. skin cancer lesions-removed  . Stroke [facility]) 05-03-11   TIA- more than 20 yrs ago.   Past Surgical History:  Procedure Laterality Date  . CARPAL TUNNEL RELEASE  05-03-11   bilateral  . CHOLECYSTECTOMY    . EYE SURGERY  05-03-11   lt. eye for lazy eye  . JOINT REPLACEMENT  05-03-11   Bil. hip/knee repalcements-now RT. Hip to be revised 05-11-11  . LOOP RECORDER IMPLANT N/A 12/05/2012   Procedure: LOOP RECORDER IMPLANT;  Surgeon: Deboraha Sprang, MD;  Location: [facility] CATH LAB;  Service: Cardiovascular;  Laterality: N/A;  . TONSILLECTOMY    . TOTAL HIP REVISION  05/11/2011   Procedure: TOTAL HIP REVISION;  Surgeon: Dione Plover Aluisio;  Location: [facility] ORS;  Service: Orthopedics;  Laterality: Right;  . UVULOPALATOPHARYNGOPLASTY  05-03-11   x2 . Can.t tolerate cpap machine, doesn't use   Social History    Social History  . Marital status: Married    Spouse name: N/A  . Number of children: N/A  . Years of education: N/A   Social History Main Topics  . Smoking status: Former Smoker    Packs/day: 1.50    Years: 20.00    Quit date: 06/20/1977  . Smokeless tobacco: Never Used  . Alcohol use None  . Drug use: Unknown  . Sexual activity: No   Other Topics Concern  . None   Social History Narrative  . None   Family History  Problem Relation Age of Onset  . Heart disease Father    - negative except otherwise stated in the family history section No Known Allergies Prior to Admission medications   Medication Sig Start Date End Date Taking? Authorizing Provider  albuterol (PROVENTIL) (2.5 MG/3ML) 0.083% nebulizer solution Take 2.5 mg by nebulization every 4 (four) hours as needed. As directed 05/06/13  Yes [provider]  aspirin EC 81 MG tablet Take 81 mg by mouth daily.   Yes [provider]  carbidopa-levodopa-entacapone (STALEVO) 25-100-200 MG per tablet Take 1 tablet by mouth 3 (three) times daily.    Yes [provider]  divalproex (DEPAKOTE SPRINKLE) 125 MG capsule Take 125 mg by mouth 2 (two) times daily. 11/18/16  Yes [provider]  furosemide (LASIX) 40 MG tablet Take 40 mg by mouth 2 (two) times daily.    Yes [provider]  guaiFENesin-dextromethorphan (ROBITUSSIN DM) 100-10 MG/5ML syrup Take 5 mLs by mouth 3 (three) times daily as needed. cough   Yes [provider]  HUMALOG 100 UNIT/ML injection Inject 16 Units into the skin 2 (two) times daily. 01/01/17  Yes [provider]  hydrOXYzine (ATARAX/VISTARIL) 25 MG tablet Take 25 mg by mouth 3 (three) times daily.   Yes [provider]  ipratropium (ATROVENT) 0.02 % nebulizer solution Take 0.5 mg by nebulization every 4 (four) hours as needed for wheezing. As directed 05/14/13  Yes [provider]  LEVEMIR FLEXTOUCH 100 UNIT/ML Pen Inject 45-54  Units into the skin 2 (two) times daily. Inject 54 u in the am and 45 u in the evening 10/28/16  Yes [provider]  loratadine (CLARITIN) 10 MG tablet Take 10 mg by mouth 2 (two) times daily.    Yes [provider]  Omega-3 Fatty Acids (FISH OIL PO) Take 2 capsules by mouth daily.   Yes [provider]  potassium chloride SA (K-DUR,KLOR-CON) 20 MEQ tablet Take 20 mEq by mouth daily. Takes 1 tablets oral every day   Yes [provider]  simvastatin (ZOCOR) 40 MG tablet Take 40 mg by mouth at bedtime.    Yes [provider]  spironolactone (ALDACTONE) 25 MG tablet Take 25 mg by mouth daily.   Yes [provider]  traMADol (ULTRAM) 50 MG tablet Take 50 mg by mouth 3 (three) times daily. Pain     Yes [provider]  traZODone (DESYREL) 50 MG tablet Take 50 mg by mouth at bedtime.   Yes [provider]  clindamycin (CLEOCIN) 300 MG capsule Take 1 capsule (300 mg total) by mouth 3 (three) times daily. Patient not taking: Reported on 02/03/2017 01/24/17   Landis Martins, DPM   Dg Chest 2 View  Result Date: 02/03/2017 CLINICAL DATA:  83 y/o  M; 2 days of fever. EXAM: CHEST  2 VIEW COMPARISON:  12/04/2012 chest radiograph FINDINGS: Diffuse coarse reticular opacities of the lungs predominantly at the periphery with patchy opacities in the lower lobes best seen on the lateral radiograph. Mild cardiomegaly. Aortic atherosclerosis with calcification. No pleural effusion or pneumothorax. No acute osseous abnormality is identified. IMPRESSION: Diffuse reticular opacities and patchy bilateral lower lobe opacities may represent pulmonary edema or multifocal pneumonia. No pleural effusion. Aortic atherosclerosis. Mild cardiomegaly. Electronically Signed   By: Kristine Garbe M.D.   On: 02/03/2017 18:12   Dg Foot 2 Views Right  Result Date: 02/03/2017 CLINICAL DATA:  Clinical concern for osteomyelitis.  Fever. EXAM: RIGHT FOOT - 2 VIEW  COMPARISON:  Radiographs 10/31/2016, foot MRI 12/01/2016 FINDINGS: Large plantar skin/soft tissue defect. No radiopaque foreign body. Question of increased periosteal reaction about proximal fourth metatarsal. No other is no evidence of bony destructive change. Charcot changes in the midfoot. Radiographic evaluation for osteomyelitis in this setting is limited. Chronic lateral subluxation of the second and third digits with associated soft tissue edema. Marked dorsal soft tissue edema. IMPRESSION: 1. Large plantar skin/soft tissue defect. 2. Questionable periosteal reaction about the proximal fourth metatarsal, increased from prior exam. Osteomyelitis was seen in this area on prior MRI. No additional radiographic findings of osteomyelitis. 3. Chronic Charcot changes in the midfoot, second and third digit subluxation and multifocal soft tissue edema. Electronically Signed   By: Jeb Levering M.D.   On: 02/03/2017 20:02   - pertinent xrays, CT, MRI studies were reviewed and independently interpreted  Positive ROS: All other systems have been  reviewed and were otherwise negative with the exception of those mentioned in the HPI and as above.  Physical Exam: General: patient is not alert or oriented. He does have dementia. Patient's wife answers all of his questions. Psychiatric: Patient has dementia. Lymphatic: No axillary or cervical lymphadenopathy Cardiovascular: No pedal edema Respiratory: patient is short of breath with nasal cannula FiO2. GI: No organomegaly, abdomen is soft and non-tender  Skin: examination patient has cellulitis of the entire right leg. There is pitting edema with venous insufficiency. Patient has a large chronic ulcer over the plantar aspect of the right hindfoot. There is foul smelling drainage.   Neurologic: Patient does not have protective sensation bilateral lower extremities.   MUSCULOSKELETAL:  Examination patient does not have a palpable dorsalis pedis or  posterior tibial pulse. He has a hind foot ulcer on the plantar aspect of the heel which probes down to bone. There is brawny edema in his foot. Review of the radiographs shows chronic osteomyelitis of the hindfoot and review of the MRI scan shows osteomyelitis involving the calcaneus cuboid and base of the fourth and fifth metatarsals.  Assessment: Assessment: Diabetic insensate neuropathy with peripheral vascular disease venous insufficiency with sepsis from chronic osteomyelitis of the right hindfoot.  Plan: Plan: We will plan for urgent surgical intervention for transtibial amputation on the right. Patient is posted for surgery tomorrow morning at 7:30 at [facility]. Patient will be transferred to [facility]. Patient will require skilled nursing placement postoperatively. I doubt patient has the strength to participate with physical therapy and it is unlikely that he would have the strength to use a prosthesis.  Thank you for the consult and the opportunity to see Preston Carter, [place] [phone] 2:51 PM

## 2017-02-04 NOTE — Progress Notes (Signed)
Preston Carter is a 83 y.o. male patient admitted from ED lethargic, A&Ox1 to self- no acute distress noted, however he becomes SOB with movement.  VSS - Blood pressure 129/75, pulse 95, temperature 99.4 F (37.4 C), temperature source Oral, resp. rate 20, height 5\' 11"  (1.803 m), weight (!) 136.9 kg (301 lb 12.8 oz), SpO2 98 %.    IV in place, occlusive dsg intact without redness.  Orientation to room, and floor completed with information packet given to patient/family.  Patient declined safety video at this time.  Admission INP armband ID verified with patient/family, and in place.   SR up x 2, fall assessment complete, with patient and family able to verbalize understanding of risk associated with falls, and verbalized understanding to call nsg before up out of bed.  Call light within reach, patient able to voice, and demonstrate understanding.  Skin, clean-dry- intact without evidence of bruising, or skin tears.   No evidence of skin break down noted on exam.     Will cont to eval and treat per MD orders.  Milas Hock, RN 02/04/2017 6:47 PM

## 2017-02-04 NOTE — Progress Notes (Signed)
Pt left at this time with [facility] headed to [facility]. Spouse of pt here and aware of transfer.

## 2017-02-04 NOTE — Evaluation (Signed)
Clinical/Bedside Swallow Evaluation Patient Details  Name: Preston Carter MRN: [id number] Date of Birth: [birth year]  Today's Date: 02/04/2017 Time: SLP Start Time (ACUTE ONLY): 4481 SLP Stop Time (ACUTE ONLY): 1617 SLP Time Calculation (min) (ACUTE ONLY): 20 min  Past Medical History:  Past Medical History:  Diagnosis Date  . Anemia 05/14/2011  . Arthritis 05-03-11   most joints- Bil. hips/knees replaced in the past  . Asthma   . DEMENTIA 05-03-11   hx. Alzheimers-tx. Aricept  . Diabetes mellitus 05-03-11   Oral meds used-Dabetes x5 yrs-CBG's (145)  . Hypertension   . Irregular heart beat   . Neuromuscular disorder ([place]) 05-03-11   Dx. Parkinson's -essential tremors-generally, ambulates with walker  . Shortness of breath 05-03-11   SOB with exertion only  . Skin cancer 05-03-11   Past hx. skin cancer lesions-removed  . Stroke [facility]) 05-03-11   TIA- more than 20 yrs ago.   Past Surgical History:  Past Surgical History:  Procedure Laterality Date  . CARPAL TUNNEL RELEASE  05-03-11   bilateral  . CHOLECYSTECTOMY    . EYE SURGERY  05-03-11   lt. eye for lazy eye  . JOINT REPLACEMENT  05-03-11   Bil. hip/knee repalcements-now RT. Hip to be revised 05-11-11  . LOOP RECORDER IMPLANT N/A 12/05/2012   Procedure: LOOP RECORDER IMPLANT;  Surgeon: Deboraha Sprang, MD;  Location: [facility] CATH LAB;  Service: Cardiovascular;  Laterality: N/A;  . TONSILLECTOMY    . TOTAL HIP REVISION  05/11/2011   Procedure: TOTAL HIP REVISION;  Surgeon: Dione Plover Aluisio;  Location: [facility] ORS;  Service: Orthopedics;  Laterality: Right;  . UVULOPALATOPHARYNGOPLASTY  05-03-11   x2 . Can.t tolerate cpap machine, doesn't use   HPI:  83 y.o. male with medical history significant of Parkinson's disease, dementia, HTN, DM type II, H/O MRSA infections, and TIA/stroke; who presents with complaints of fever off and on for the last two weeks.  History is obtained from the patient's wife was present at bedside. Today fever  got up as high as 101.10F.He has been dealing with right foot ulcer for quite some time. Patient had recently completed a 10 day course of clindamycin for right foot ulcer given by Dr. Cannon Kettle of podiatry. Dr. Cannon Kettle had also scheduled him ABI testing on the 24th of this month. He had also recently checked a MRI which showed signs of osteomyelitis. He has been having wound care come out twice a week for some unknown time period to wrap his legs and monitor the foot ulcer that has been present for 3 years. Wife states that it has been chronically draining, although the odor may have worsened recently. Patient has a chronic cough that is productive, but has been worse in the last 2 days. Associated symptoms include decreased appetite, worsened tremor, generalized weakness, and 2 falls in last week; CXR on 02/03/17 indicated Diffuse reticular opacities and patchy bilateral lower lobe opacities may represent pulmonary edema or multifocal pneumonia. No pleural effusion. Aortic atherosclerosis. Mild cardiomegaly; BSE ordered d/t pt having difficulty with medication administration per nursing.  Assessment / Plan / Recommendation Clinical Impression   Pt administered extensive oral care and various strategies such as sternal rub and increased sensory stimulation to arouse pt to assess PO intake with minimal success; pt held ice chips and 1/3 tsp water in anterior oral cavity with eventual expulsion without swallow triggering; wife informed SLP he has not had a hx of dysphagia despite Parkinson's diagnosis; recommend NPO until pt's mentation  improves; ST will f/u while in acute setting for PO readiness. SLP Visit Diagnosis: Dysphagia, unspecified (R13.10)    Aspiration Risk  Severe aspiration risk    Diet Recommendation     Medication Administration: Via alternative means    Other  Recommendations Oral Care Recommendations: Oral care QID   Follow up Recommendations Other (comment) (TBD)      Frequency and  Duration min 2x/week  1 week       Prognosis Prognosis for Safe Diet Advancement: Guarded      Swallow Study   General Date of Onset: 02/03/17 HPI: 83 y.o. male with medical history significant of Parkinson's disease, dementia, HTN, DM type II, H/O MRSA infections, and TIA/stroke; who presents with complaints of fever off and on for the last two weeks.  History is obtained from the patient's wife was present at bedside. Today fever got up as high as 101.69F.He has been dealing with right foot ulcer for quite some time. Patient had recently completed a 10 day course of clindamycin for right foot ulcer given by Dr. Cannon Kettle of podiatry. Dr. Cannon Kettle had also scheduled him ABI testing on the 24th of this month. He had also recently checked a MRI which showed signs of osteomyelitis. He has been having wound care come out twice a week for some unknown time period to wrap his legs and monitor the foot ulcer that has been present for 3 years. Wife states that it has been chronically draining, although the odor may have worsened recently. Patient has a chronic cough that is productive, but has been worse in the last 2 days. Associated symptoms include decreased appetite, worsened tremor, generalized weakness, and 2 falls in last week Type of Study: Bedside Swallow Evaluation Previous Swallow Assessment: In ED, nursing stated he coughed with meds Diet Prior to this Study: NPO Temperature Spikes Noted: Yes Respiratory Status: Nasal cannula History of Recent Intubation: No Behavior/Cognition: Lethargic/Drowsy;Doesn't follow directions Oral Care Completed by SLP: Yes Oral Cavity - Dentition: Missing dentition Patient Positioning: Upright in bed Baseline Vocal Quality: Not observed Volitional Cough: Cognitively unable to elicit Volitional Swallow: Unable to elicit    Oral/Motor/Sensory Function Overall Oral Motor/Sensory Function: Other (comment) (DTA)   Ice Chips Ice chips: Impaired Presentation:  Spoon Oral Phase Impairments: Poor awareness of bolus Oral Phase Functional Implications: Other (comment) (removed from oral cavity due to holding/decreased awareness)   Thin Liquid Thin Liquid: Impaired Presentation: Spoon Oral Phase Impairments: Reduced lingual movement/coordination;Poor awareness of bolus Oral Phase Functional Implications: Right anterior spillage;Oral holding;Other (comment) (poor awareness; limited manipulation)    Nectar Thick Nectar Thick Liquid: Not tested   Honey Thick Honey Thick Liquid: Not tested   Puree Puree: Not tested   Solid      Solid: Not tested        Elvina Sidle, M.S., CCC-SLP 02/04/2017,4:41 PM

## 2017-02-04 NOTE — Progress Notes (Signed)
Writer gave report to receiving nurse at [facility], [place]. Pt to be transferred to [facility] via [facility] this evening. Family at bedside and aware of transfer for surgery in morning. Will await [facility]. Pt remains in bed on 2L/min O2 via [place]. Continues to be very lethargic and drowsy. VS WNL. Dressing change to right foot with Dakin's solution.

## 2017-02-04 NOTE — Progress Notes (Signed)
Spoke with wife at bedside about order for CPAP. She states that he has not worn in years and when he did try it he did not tolerate the masks. Since then he just wears [place] QHS. I told her if they wanted to give the CPAP another try to call and we would get one to them.

## 2017-02-05 ENCOUNTER — Encounter (HOSPITAL_COMMUNITY): Payer: Self-pay | Admitting: Certified Registered"

## 2017-02-05 ENCOUNTER — Inpatient Hospital Stay (HOSPITAL_COMMUNITY): Payer: Medicare Other | Admitting: Anesthesiology

## 2017-02-05 ENCOUNTER — Encounter (HOSPITAL_COMMUNITY)
Admission: EM | Disposition: A | Discharge status: Skilled Nursing Facility | Payer: Self-pay | Source: Home / Self Care | Attending: Nephrology

## 2017-02-05 DIAGNOSIS — I96 Gangrene, not elsewhere classified: Secondary | ICD-10-CM

## 2017-02-05 HISTORY — PX: AMPUTATION: SHX166

## 2017-02-05 LAB — CBC
HEMATOCRIT: 30.8 % — AB (ref 39.0–52.0)
Hemoglobin: 10.6 g/dL — ABNORMAL LOW (ref 13.0–17.0)
MCH: 27.5 pg (ref 26.0–34.0)
MCHC: 34.4 g/dL (ref 30.0–36.0)
MCV: 79.8 fL (ref 78.0–100.0)
PLATELETS: 225 10*3/uL (ref 150–400)
RBC: 3.86 MIL/uL — AB (ref 4.22–5.81)
RDW: 16.6 % — ABNORMAL HIGH (ref 11.5–15.5)
WBC: 16.6 10*3/uL — ABNORMAL HIGH (ref 4.0–10.5)

## 2017-02-05 LAB — GLUCOSE, CAPILLARY
GLUCOSE-CAPILLARY: 168 mg/dL — AB (ref 65–99)
Glucose-Capillary: 165 mg/dL — ABNORMAL HIGH (ref 65–99)
Glucose-Capillary: 177 mg/dL — ABNORMAL HIGH (ref 65–99)
Glucose-Capillary: 389 mg/dL — ABNORMAL HIGH (ref 65–99)

## 2017-02-05 SURGERY — AMPUTATION BELOW KNEE
Anesthesia: General | Site: Leg Lower | Laterality: Right

## 2017-02-05 MED ORDER — METOCLOPRAMIDE HCL 5 MG/ML IJ SOLN: 5.0000 mg | Freq: Three times a day (TID) | INTRAMUSCULAR | Status: DC | PRN

## 2017-02-05 MED ORDER — FENTANYL CITRATE (PF) 250 MCG/5ML IJ SOLN
INTRAMUSCULAR | Status: AC
  Filled 2017-02-05: qty 5

## 2017-02-05 MED ORDER — HYDROMORPHONE HCL 1 MG/ML IJ SOLN: 1.0000 mg | INTRAMUSCULAR | Status: DC | PRN

## 2017-02-05 MED ORDER — ACETAMINOPHEN 325 MG PO TABS: 650.0000 mg | ORAL_TABLET | Freq: Four times a day (QID) | ORAL | Status: DC | PRN

## 2017-02-05 MED ORDER — DOCUSATE SODIUM 100 MG PO CAPS
100.0000 mg | ORAL_CAPSULE | Freq: Two times a day (BID) | ORAL | Status: DC
  Administered 2017-02-05 – 2017-02-08 (×6): 100 mg via ORAL
  Filled 2017-02-05 (×6): qty 1

## 2017-02-05 MED ORDER — BISACODYL 10 MG RE SUPP: 10.0000 mg | Freq: Every day | RECTAL | Status: DC | PRN

## 2017-02-05 MED ORDER — ONDANSETRON HCL 4 MG/2ML IJ SOLN: 4.0000 mg | Freq: Four times a day (QID) | INTRAMUSCULAR | Status: DC | PRN

## 2017-02-05 MED ORDER — CHLORHEXIDINE GLUCONATE CLOTH 2 % EX PADS
6.0000 | MEDICATED_PAD | Freq: Every day | CUTANEOUS | Status: DC
Start: 2017-02-05 — End: 2017-02-08
  Administered 2017-02-05 – 2017-02-08 (×4): 6 via TOPICAL

## 2017-02-05 MED ORDER — ONDANSETRON HCL 4 MG/2ML IJ SOLN
INTRAMUSCULAR | Status: DC | PRN
  Administered 2017-02-05: 4 mg via INTRAVENOUS

## 2017-02-05 MED ORDER — METOCLOPRAMIDE HCL 10 MG PO TABS: 5.0000 mg | ORAL_TABLET | Freq: Three times a day (TID) | ORAL | Status: DC | PRN

## 2017-02-05 MED ORDER — ONDANSETRON HCL 4 MG/2ML IJ SOLN
INTRAMUSCULAR | Status: AC
  Filled 2017-02-05: qty 2

## 2017-02-05 MED ORDER — INSULIN DETEMIR 100 UNIT/ML ~~LOC~~ SOLN
7.0000 [IU] | Freq: Once | SUBCUTANEOUS | Status: AC
  Administered 2017-02-05: 7 [IU] via SUBCUTANEOUS
  Filled 2017-02-05: qty 0.07

## 2017-02-05 MED ORDER — LIDOCAINE 2% (20 MG/ML) 5 ML SYRINGE
INTRAMUSCULAR | Status: DC | PRN
  Administered 2017-02-05: 20 mg via INTRAVENOUS

## 2017-02-05 MED ORDER — SUCCINYLCHOLINE CHLORIDE 200 MG/10ML IV SOSY
PREFILLED_SYRINGE | INTRAVENOUS | Status: AC
  Filled 2017-02-05: qty 10

## 2017-02-05 MED ORDER — PROPOFOL 10 MG/ML IV BOLUS
INTRAVENOUS | Status: DC | PRN
  Administered 2017-02-05: 200 mg via INTRAVENOUS

## 2017-02-05 MED ORDER — OXYCODONE HCL 5 MG PO TABS
5.0000 mg | ORAL_TABLET | ORAL | Status: DC | PRN
  Administered 2017-02-06 (×2): 5 mg via ORAL
  Administered 2017-02-07: 10 mg via ORAL
  Filled 2017-02-05 (×2): qty 1
  Filled 2017-02-05: qty 2

## 2017-02-05 MED ORDER — ONDANSETRON HCL 4 MG PO TABS: 4.0000 mg | ORAL_TABLET | Freq: Four times a day (QID) | ORAL | Status: DC | PRN

## 2017-02-05 MED ORDER — LIDOCAINE 2% (20 MG/ML) 5 ML SYRINGE
INTRAMUSCULAR | Status: AC
  Filled 2017-02-05: qty 5

## 2017-02-05 MED ORDER — MAGNESIUM CITRATE PO SOLN: 1.0000 | Freq: Once | ORAL | Status: DC | PRN

## 2017-02-05 MED ORDER — POLYETHYLENE GLYCOL 3350 17 G PO PACK: 17.0000 g | PACK | Freq: Every day | ORAL | Status: DC | PRN

## 2017-02-05 MED ORDER — SODIUM CHLORIDE 0.9 % IV SOLN
INTRAVENOUS | Status: DC
  Administered 2017-02-05: 18:00:00 via INTRAVENOUS

## 2017-02-05 MED ORDER — ACETAMINOPHEN 650 MG RE SUPP: 650.0000 mg | Freq: Four times a day (QID) | RECTAL | Status: DC | PRN

## 2017-02-05 MED ORDER — FENTANYL CITRATE (PF) 100 MCG/2ML IJ SOLN
25.0000 ug | INTRAMUSCULAR | Status: DC | PRN
Start: 2017-02-05 — End: 2017-02-06

## 2017-02-05 MED ORDER — LACTATED RINGERS IV SOLN
INTRAVENOUS | Status: DC | PRN
  Administered 2017-02-05: 08:00:00 via INTRAVENOUS

## 2017-02-05 MED ORDER — FENTANYL CITRATE (PF) 250 MCG/5ML IJ SOLN
INTRAMUSCULAR | Status: DC | PRN
  Administered 2017-02-05 (×2): 25 ug via INTRAVENOUS

## 2017-02-05 MED ORDER — METHOCARBAMOL 500 MG PO TABS
500.0000 mg | ORAL_TABLET | Freq: Four times a day (QID) | ORAL | Status: DC | PRN
  Administered 2017-02-06 (×2): 500 mg via ORAL
  Filled 2017-02-05 (×2): qty 1

## 2017-02-05 MED ORDER — MUPIROCIN 2 % EX OINT
1.0000 "application " | TOPICAL_OINTMENT | Freq: Two times a day (BID) | CUTANEOUS | Status: DC
  Administered 2017-02-05 – 2017-02-07 (×7): 1 via NASAL
  Filled 2017-02-05 (×5): qty 22

## 2017-02-05 MED ORDER — PROPOFOL 10 MG/ML IV BOLUS
INTRAVENOUS | Status: AC
  Filled 2017-02-05: qty 20

## 2017-02-05 MED ORDER — 0.9 % SODIUM CHLORIDE (POUR BTL) OPTIME
TOPICAL | Status: DC | PRN
  Administered 2017-02-05: 1000 mL

## 2017-02-05 MED ORDER — RESOURCE THICKENUP CLEAR PO POWD
ORAL | Status: DC | PRN
  Filled 2017-02-05: qty 125

## 2017-02-05 MED ORDER — METHOCARBAMOL 1000 MG/10ML IJ SOLN
500.0000 mg | Freq: Four times a day (QID) | INTRAVENOUS | Status: DC | PRN
  Filled 2017-02-05: qty 5

## 2017-02-05 SURGICAL SUPPLY — 34 items
BLADE SAW RECIP 87.9 MT (BLADE) ×3 IMPLANT
BLADE SURG 21 STRL SS (BLADE) ×3 IMPLANT
BNDG COHESIVE 6X5 TAN STRL LF (GAUZE/BANDAGES/DRESSINGS) ×3 IMPLANT
BNDG GAUZE ELAST 4 BULKY (GAUZE/BANDAGES/DRESSINGS) IMPLANT
CANISTER WOUND CARE 500ML ATS (WOUND CARE) ×3 IMPLANT
COVER SURGICAL LIGHT HANDLE (MISCELLANEOUS) ×3 IMPLANT
CUFF TOURNIQUET SINGLE 34IN LL (TOURNIQUET CUFF) IMPLANT
CUFF TOURNIQUET SINGLE 44IN (TOURNIQUET CUFF) ×3 IMPLANT
DRAPE INCISE IOBAN 66X45 STRL (DRAPES) ×6 IMPLANT
DRAPE U-SHAPE 47X51 STRL (DRAPES) ×3 IMPLANT
DRESSING PREVENA PLUS CUSTOM (GAUZE/BANDAGES/DRESSINGS) ×1 IMPLANT
DRSG PREVENA PLUS CUSTOM (GAUZE/BANDAGES/DRESSINGS) ×3
DRSG VAC ATS MED SENSATRAC (GAUZE/BANDAGES/DRESSINGS) IMPLANT
ELECT REM PT RETURN 9FT ADLT (ELECTROSURGICAL) ×3
ELECTRODE REM PT RTRN 9FT ADLT (ELECTROSURGICAL) ×1 IMPLANT
GLOVE BIOGEL PI IND STRL 9 (GLOVE) ×1 IMPLANT
GLOVE BIOGEL PI INDICATOR 9 (GLOVE) ×2
GLOVE SURG ORTHO 9.0 STRL STRW (GLOVE) ×6 IMPLANT
GOWN STRL REUS W/ TWL XL LVL3 (GOWN DISPOSABLE) ×1 IMPLANT
GOWN STRL REUS W/TWL XL LVL3 (GOWN DISPOSABLE) ×2
KIT BASIN OR (CUSTOM PROCEDURE TRAY) ×3 IMPLANT
KIT DRSG PREVENA PLUS 7DAY 125 (MISCELLANEOUS) ×3 IMPLANT
KIT ROOM TURNOVER OR (KITS) ×3 IMPLANT
MANIFOLD NEPTUNE II (INSTRUMENTS) ×3 IMPLANT
NS IRRIG 1000ML POUR BTL (IV SOLUTION) ×3 IMPLANT
PACK ORTHO EXTREMITY (CUSTOM PROCEDURE TRAY) ×3 IMPLANT
PAD ARMBOARD 7.5X6 YLW CONV (MISCELLANEOUS) ×9 IMPLANT
SPONGE LAP 18X18 X RAY DECT (DISPOSABLE) ×6 IMPLANT
STAPLER VISISTAT 35W (STAPLE) ×3 IMPLANT
STOCKINETTE IMPERVIOUS LG (DRAPES) ×3 IMPLANT
SUT SILK 2 0 (SUTURE) ×2
SUT SILK 2-0 18XBRD TIE 12 (SUTURE) ×1 IMPLANT
SUT VIC AB 1 CTX 27 (SUTURE) ×6 IMPLANT
TOWEL OR 17X26 10 PK STRL BLUE (TOWEL DISPOSABLE) ×3 IMPLANT

## 2017-02-05 NOTE — Progress Notes (Signed)
PT.had 6 beats of Vtach.Pt.was asymptomatic.V/S taken B/P=132/64;P=95;R=20;O2 sat=100 % 4lnc.MD on call [place] made aware,no orders received.Will cont.to monitor pt.

## 2017-02-05 NOTE — Anesthesia Postprocedure Evaluation (Signed)
Anesthesia Post Note  Patient: FERNAND SORBELLO  Procedure(s) Performed: Procedure(s) (LRB): AMPUTATION BELOW KNEE (Right)     Patient location during evaluation: PACU Anesthesia Type: General Level of consciousness: awake and alert Pain management: pain level controlled Vital Signs Assessment: post-procedure vital signs reviewed and stable Respiratory status: spontaneous breathing, nonlabored ventilation, respiratory function stable and patient connected to nasal cannula oxygen Cardiovascular status: blood pressure returned to baseline and stable Postop Assessment: no signs of nausea or vomiting Anesthetic complications: no    Last Vitals:  Vitals:   02/05/17 0908 02/05/17 0910  BP: (!) 158/84   Pulse: 91   Resp: 20   Temp:  (!) 36.2 C  SpO2: 92%     Last Pain:  Vitals:   02/05/17 0430  TempSrc: Oral  PainSc:                  Tiajuana Amass

## 2017-02-05 NOTE — Transfer of Care (Signed)
Immediate Anesthesia Transfer of Care Note  Patient: Preston Carter  Procedure(s) Performed: Procedure(s): AMPUTATION BELOW KNEE (Right)  Patient Location: PACU  Anesthesia Type:GA combined with regional for post-op pain  Level of Consciousness: sedated and responds to stimulation  Airway & Oxygen Therapy: Patient Spontanous Breathing and Patient connected to face mask oxygen  Post-op Assessment: Report given to RN, Post -op Vital signs reviewed and stable and Patient moving all extremities  Post vital signs: Reviewed and stable  Last Vitals:  Vitals:   02/04/17 2138 02/05/17 0430  BP: 135/75 132/64  Pulse: 78 95  Resp: 18 20  Temp: 37.1 C 36.7 C  SpO2: (!) 89% 100%    Last Pain:  Vitals:   02/05/17 0430  TempSrc: Oral  PainSc:          Complications: No apparent anesthesia complications

## 2017-02-05 NOTE — Op Note (Signed)
   Date of Surgery: 02/05/2017  INDICATIONS: Mr. Thomley is a 83 y.o.-year-old male who presents with diabetic insensate neuropathy severe peripheral vascular disease with venous insufficiency with multiple other medical conditions with osteomyelitis and ulceration of the right hind foot.Marland Kitchen  PREOPERATIVE DIAGNOSIS: Abscess ulceration osteomyelitis right hindfoot  POSTOPERATIVE DIAGNOSIS: Same.  PROCEDURE: Transtibial amputation Application of Prevena wound VAC  SURGEON: Sharol Given, M.D.  ANESTHESIA:  general  IV FLUIDS AND URINE: See anesthesia.  ESTIMATED BLOOD LOSS: See anesthesia notes mL.  COMPLICATIONS: None.  DESCRIPTION OF PROCEDURE: The patient was brought to the operating room and underwent a general anesthetic. After adequate levels of anesthesia were obtained patient's lower extremity was prepped using DuraPrep draped into a sterile field. A timeout was called. The foot was draped out of the sterile field with impervious stockinette. A transverse incision was made 11 cm distal to the tibial tubercle. This curved proximally and a large posterior flap was created. The tibia was transected 1 cm proximal to the skin incision. The fibula was transected just proximal to the tibial incision. The tibia was beveled anteriorly. A large posterior flap was created. The sciatic nerve was pulled cut and allowed to retract. The vascular bundles were suture ligated with 2-0 silk. The deep and superficial fascial layers were closed using #1 Vicryl. The skin was closed using staples and 2-0 nylon. The wound was covered with a Prevena wound VAC. There was a good suction fit. Patient was extubated taken to the PACU in stable condition.  Meridee Score, MD [place] 8:57 AM

## 2017-02-05 NOTE — Anesthesia Procedure Notes (Addendum)
Procedure Name: LMA Insertion Date/Time: 02/05/2017 7:49 AM Performed by: Myna Bright Pre-anesthesia Checklist: Patient identified, Emergency Drugs available, Suction available and Patient being monitored Patient Re-evaluated:Patient Re-evaluated prior to induction Oxygen Delivery Method: Circle system utilized Preoxygenation: Pre-oxygenation with 100% oxygen Induction Type: IV induction LMA: LMA inserted LMA Size: 4.0 Tube type: Oral Number of attempts: 1 Placement Confirmation: positive ETCO2 Tube secured with: Tape Dental Injury: Teeth and Oropharynx as per pre-operative assessment

## 2017-02-05 NOTE — Progress Notes (Addendum)
PROGRESS NOTE    Preston Carter  AOZ:[id number] DOB: 03-Oct-1931 DOA: 02/03/2017 PCP: Leanna Battles, MD   Brief Narrative: 83 year old male with history of Parkinson disease, COPD on home oxygen, essential hypertension, type 2 diabetes on insulin presented with fever for about 2 weeks. In the ER patient was found to have temperatures of 101.3. Patient with right foot ulcer and completed 10 days of clindamycin before admission. MRI showed osteomyelitis. Patient was transferred to [facility] from [facility] for orthopedics surgery. Status post transtibial amputation and application of wound VAC by Dr. Sharol Given.  Assessment & Plan:  # Sepsis due to right foot ulcer/with gangrene and subacute osteomyelitis: -Patient failed outpatient antibiotics treatment. Status post transtibial amputation and application of wound VAC by Dr. Sharol Given on August 19. -Continue wound care - Currently on IV vancomycin and ceftriaxone. Leukocytosis trending down. Patient is afebrile. Follow up culture results. I will discuss with Dr. Sharol Given regarding antibiotics.  -Pain management, PT OT evaluation  #Hyponatremia and hypokalemia: Monitor electrolytes. Potassium level acceptable today.  #Chronic kidney disease is stage III: Serum creatinine level is stable. Monitor BMP. Avoid neurotoxins.  #Multiple sacral decubitus ulcer stage II: Continue wound care  #OSA: Continue CPAP at night  #Type 2 diabetes on chronic insulin: Continue current insulin regimen. Monitor blood sugar level. On dysphagia diet.  #Parkinson disease: Continue Sinemet. Palliative care consult follow-up  # COPD with chronic respiratory failure: Continue nebulization and oxygen treatment.  DVT prophylaxis:lovenox sq Code Status: Full code Family Communication: Discussed with multiple family members at bedside Disposition Plan: Likely discharge to skilled facility in 1-2 days. PT OT evaluation. Social Environmental manager.    Consultants:    Orthopedics  Procedures:transtibial amputation and application of wound VAC by Dr. Sharol Given. Antimicrobials: Vancomycin and ceftriaxone Subjective: Seen and examined at bedside. Patient came back from surgery. Looks comfortable. Denied headache, dizziness, nausea vomiting. Multiple family members at bedside including patient's wife  Objective: Vitals:   02/05/17 0910 02/05/17 0933 02/05/17 1300 02/05/17 1537  BP:  (!) 138/53 127/62 (!) 126/57  Pulse:    75  Resp:  20 18 16   Temp: (!) 97.2 F (36.2 C) 98 F (36.7 C) 97.8 F (36.6 C) 98.6 F (37 C)  TempSrc:    Oral  SpO2:    100%  Weight:      Height:        Intake/Output Summary (Last 24 hours) at 02/05/17 1651 Last data filed at 02/05/17 1143  Gross per 24 hour  Intake              460 ml  Output              775 ml  Net             -315 ml   Filed Weights   02/04/17 0001 02/04/17 0455 02/05/17 0500  Weight: 132.9 kg (293 lb) (!) 136.9 kg (301 lb 12.8 oz) 135.9 kg (299 lb 8 oz)    Examination:  General exam: Appears calm and comfortable  Respiratory system: Clear to auscultation. Respiratory effort normal. No wheezing or crackle Cardiovascular system: S1 & S2 heard, RRR.   Gastrointestinal system: Abdomen is  soft and nontender. Normal bowel sounds heard. Central nervous system: Alert Awake and following commands.  Musculoskeletal: Right amputation site with dressing and wound VAC. No sign of bleeding  Data Reviewed: I have personally reviewed following labs and imaging studies  CBC:  Recent Labs Lab 02/03/17 1816 02/04/17 0749 02/05/17  1007  WBC 21.3* 20.0* 16.6*  NEUTROABS 17.8*  --   --   HGB 11.0* 10.9* 10.6*  HCT 31.2* 31.2* 30.8*  MCV 79.0 79.6 79.8  PLT 206 210 169   Basic Metabolic Panel:  Recent Labs Lab 02/03/17 1816 02/04/17 0601  NA 129* 131*  K 3.1* 3.6  CL 86* 89*  CO2 30 30  GLUCOSE 126* 60*  BUN 63* 58*  CREATININE 1.89* 1.67*  CALCIUM 8.8* 8.8*  MG  --  2.2    GFR: Estimated Creatinine Clearance: 47.2 mL/min (A) (by C-G formula based on SCr of 1.67 mg/dL (H)). Liver Function Tests:  Recent Labs Lab 02/03/17 1816 02/04/17 0601  AST 70* 77*  ALT 11* 14*  ALKPHOS 159* 154*  BILITOT 0.7 1.0  PROT 7.3 7.1  ALBUMIN 2.6* 2.5*   No results for input(s): LIPASE, AMYLASE in the last 168 hours. No results for input(s): AMMONIA in the last 168 hours. Coagulation Profile:  Recent Labs Lab 02/03/17 2215  INR 1.10   Cardiac Enzymes: No results for input(s): CKTOTAL, CKMB, CKMBINDEX, TROPONINI in the last 168 hours. BNP (last 3 results) No results for input(s): PROBNP in the last 8760 hours. HbA1C:  Recent Labs  02/04/17 0601  HGBA1C 7.9*   CBG:  Recent Labs Lab 02/04/17 1710 02/04/17 2141 02/05/17 0429 02/05/17 0848 02/05/17 1145  GLUCAP 115* 136* 165* 168* 177*   Lipid Profile: No results for input(s): CHOL, HDL, LDLCALC, TRIG, CHOLHDL, LDLDIRECT in the last 72 hours. Thyroid Function Tests: No results for input(s): TSH, T4TOTAL, FREET4, T3FREE, THYROIDAB in the last 72 hours. Anemia Panel: No results for input(s): VITAMINB12, FOLATE, FERRITIN, TIBC, IRON, RETICCTPCT in the last 72 hours. Sepsis Labs:  Recent Labs Lab 02/03/17 1831 02/03/17 2215  PROCALCITON  --  2.35  LATICACIDVEN 1.67  --     Recent Results (from the past 240 hour(s))  Blood Culture (routine x 2)     Status: None (Preliminary result)   Collection Time: 02/03/17  6:15 PM  Result Value Ref Range Status   Specimen Description BLOOD RIGHT ANTECUBITAL  Final   Special Requests   Final    BOTTLES DRAWN AEROBIC AND ANAEROBIC Blood Culture adequate volume   Culture   Final    NO GROWTH 2 DAYS Performed at [facility], [address] [place], [place] [address]    Report Status PENDING  Incomplete  Blood Culture (routine x 2)     Status: None (Preliminary result)   Collection Time: 02/03/17  6:20 PM  Result Value Ref Range Status    Specimen Description BLOOD BLOOD RIGHT FOREARM  Final   Special Requests IN PEDIATRIC BOTTLE Blood Culture adequate volume  Final   Culture   Final    NO GROWTH 2 DAYS Performed at [facility], [place] [address] [place], [place] [address]    Report Status PENDING  Incomplete  Surgical pcr screen     Status: Abnormal   Collection Time: 02/04/17  8:40 PM  Result Value Ref Range Status   MRSA, PCR NEGATIVE NEGATIVE Final   Staphylococcus aureus POSITIVE (A) NEGATIVE Final    Comment:        The Xpert SA Assay (FDA approved for NASAL specimens in patients over 85 years of age), is one component of a comprehensive surveillance program.  Test performance has been validated by [facility] for patients greater than or equal to 60 year old. It is not intended to diagnose  infection nor to guide or monitor treatment.          Radiology Studies: Dg Chest 2 View  Result Date: 02/03/2017 CLINICAL DATA:  83 y/o  M; 2 days of fever. EXAM: CHEST  2 VIEW COMPARISON:  12/04/2012 chest radiograph FINDINGS: Diffuse coarse reticular opacities of the lungs predominantly at the periphery with patchy opacities in the lower lobes best seen on the lateral radiograph. Mild cardiomegaly. Aortic atherosclerosis with calcification. No pleural effusion or pneumothorax. No acute osseous abnormality is identified. IMPRESSION: Diffuse reticular opacities and patchy bilateral lower lobe opacities may represent pulmonary edema or multifocal pneumonia. No pleural effusion. Aortic atherosclerosis. Mild cardiomegaly. Electronically Signed   By: Kristine Garbe M.D.   On: 02/03/2017 18:12   Dg Foot 2 Views Right  Result Date: 02/03/2017 CLINICAL DATA:  Clinical concern for osteomyelitis.  Fever. EXAM: RIGHT FOOT - 2 VIEW COMPARISON:  Radiographs 10/31/2016, foot MRI 12/01/2016 FINDINGS: Large plantar skin/soft tissue defect. No radiopaque foreign body. Question of increased periosteal reaction about  proximal fourth metatarsal. No other is no evidence of bony destructive change. Charcot changes in the midfoot. Radiographic evaluation for osteomyelitis in this setting is limited. Chronic lateral subluxation of the second and third digits with associated soft tissue edema. Marked dorsal soft tissue edema. IMPRESSION: 1. Large plantar skin/soft tissue defect. 2. Questionable periosteal reaction about the proximal fourth metatarsal, increased from prior exam. Osteomyelitis was seen in this area on prior MRI. No additional radiographic findings of osteomyelitis. 3. Chronic Charcot changes in the midfoot, second and third digit subluxation and multifocal soft tissue edema. Electronically Signed   By: Jeb Levering M.D.   On: 02/03/2017 20:02        Scheduled Meds: . aspirin EC  81 mg Oral Daily  . carbidopa-levodopa  1 tablet Oral TID WC  . Chlorhexidine Gluconate Cloth  6 each Topical Daily  . divalproex  125 mg Oral BID  . docusate sodium  100 mg Oral BID  . enoxaparin (LOVENOX) injection  60 mg Subcutaneous QHS  . entacapone  200 mg Oral TID WC  . guaiFENesin  600 mg Oral BID  . hydrOXYzine  25 mg Oral TID  . insulin aspart  0-20 Units Subcutaneous TID WC  . insulin detemir  45 Units Subcutaneous QHS  . insulin detemir  54 Units Subcutaneous Daily  . loratadine  10 mg Oral BID  . mupirocin ointment  1 application Nasal BID  . potassium chloride SA  20 mEq Oral Daily  . simvastatin  40 mg Oral QHS  . sodium hypochlorite   Irrigation BID  . spironolactone  25 mg Oral Daily  . traZODone  50 mg Oral QHS   Continuous Infusions: . sodium chloride    . cefTRIAXone (ROCEPHIN)  IV Stopped (02/04/17 1751)  . methocarbamol (ROBAXIN)  IV    . vancomycin       LOS: 2 days    Danie Hannig Tanna Furry, MD [facility] Pager [phone]  If 7PM-7AM, please contact night-coverage [email or web address] Password [facility] 02/05/2017, 4:51 PM

## 2017-02-05 NOTE — H&P (View-Only) (Signed)
ORTHOPAEDIC CONSULTATION  REQUESTING PHYSICIAN: Charlynne Cousins, MD  Chief Complaint: cellulitis pain chronic ulcer right foot  HPI: Preston Carter is a 83 y.o. male who presents with sepsis. Patient states that he has had 2 weeks of fevers at home. Patient fell today and was unable to get up. Patient has had wound care provided by Dr. Sharlett Iles and has had compression wraps for the venous insufficiency left lower extremity by advanced home care at home with twice a week dressing changes. Patient has been on enzymatic debriding ointments as well as oral antibiotics.  Past Medical History:  Diagnosis Date  . Anemia 05/14/2011  . Arthritis 05-03-11   most joints- Bil. hips/knees replaced in the past  . Asthma   . DEMENTIA 05-03-11   hx. Alzheimers-tx. Aricept  . Diabetes mellitus 05-03-11   Oral meds used-Dabetes x5 yrs-CBG's (145)  . Hypertension   . Irregular heart beat   . Neuromuscular disorder ([place]) 05-03-11   Dx. Parkinson's -essential tremors-generally, ambulates with walker  . Shortness of breath 05-03-11   SOB with exertion only  . Skin cancer 05-03-11   Past hx. skin cancer lesions-removed  . Stroke [facility]) 05-03-11   TIA- more than 20 yrs ago.   Past Surgical History:  Procedure Laterality Date  . CARPAL TUNNEL RELEASE  05-03-11   bilateral  . CHOLECYSTECTOMY    . EYE SURGERY  05-03-11   lt. eye for lazy eye  . JOINT REPLACEMENT  05-03-11   Bil. hip/knee repalcements-now RT. Hip to be revised 05-11-11  . LOOP RECORDER IMPLANT N/A 12/05/2012   Procedure: LOOP RECORDER IMPLANT;  Surgeon: Deboraha Sprang, MD;  Location: [facility] CATH LAB;  Service: Cardiovascular;  Laterality: N/A;  . TONSILLECTOMY    . TOTAL HIP REVISION  05/11/2011   Procedure: TOTAL HIP REVISION;  Surgeon: Dione Plover Aluisio;  Location: [facility] ORS;  Service: Orthopedics;  Laterality: Right;  . UVULOPALATOPHARYNGOPLASTY  05-03-11   x2 . Can.t tolerate cpap machine, doesn't use   Social History    Social History  . Marital status: Married    Spouse name: N/A  . Number of children: N/A  . Years of education: N/A   Social History Main Topics  . Smoking status: Former Smoker    Packs/day: 1.50    Years: 20.00    Quit date: 06/20/1977  . Smokeless tobacco: Never Used  . Alcohol use None  . Drug use: Unknown  . Sexual activity: No   Other Topics Concern  . None   Social History Narrative  . None   Family History  Problem Relation Age of Onset  . Heart disease Father    - negative except otherwise stated in the family history section No Known Allergies Prior to Admission medications   Medication Sig Start Date End Date Taking? Authorizing Provider  albuterol (PROVENTIL) (2.5 MG/3ML) 0.083% nebulizer solution Take 2.5 mg by nebulization every 4 (four) hours as needed. As directed 05/06/13  Yes [provider]  aspirin EC 81 MG tablet Take 81 mg by mouth daily.   Yes [provider]  carbidopa-levodopa-entacapone (STALEVO) 25-100-200 MG per tablet Take 1 tablet by mouth 3 (three) times daily.    Yes [provider]  divalproex (DEPAKOTE SPRINKLE) 125 MG capsule Take 125 mg by mouth 2 (two) times daily. 11/18/16  Yes [provider]  furosemide (LASIX) 40 MG tablet Take 40 mg by mouth 2 (two) times daily.    Yes [provider]  guaiFENesin-dextromethorphan (ROBITUSSIN DM) 100-10 MG/5ML syrup Take 5 mLs by mouth 3 (three) times daily as needed. cough   Yes [provider]  HUMALOG 100 UNIT/ML injection Inject 16 Units into the skin 2 (two) times daily. 01/01/17  Yes [provider]  hydrOXYzine (ATARAX/VISTARIL) 25 MG tablet Take 25 mg by mouth 3 (three) times daily.   Yes [provider]  ipratropium (ATROVENT) 0.02 % nebulizer solution Take 0.5 mg by nebulization every 4 (four) hours as needed for wheezing. As directed 05/14/13  Yes [provider]  LEVEMIR FLEXTOUCH 100 UNIT/ML Pen Inject 45-54  Units into the skin 2 (two) times daily. Inject 54 u in the am and 45 u in the evening 10/28/16  Yes [provider]  loratadine (CLARITIN) 10 MG tablet Take 10 mg by mouth 2 (two) times daily.    Yes [provider]  Omega-3 Fatty Acids (FISH OIL PO) Take 2 capsules by mouth daily.   Yes [provider]  potassium chloride SA (K-DUR,KLOR-CON) 20 MEQ tablet Take 20 mEq by mouth daily. Takes 1 tablets oral every day   Yes [provider]  simvastatin (ZOCOR) 40 MG tablet Take 40 mg by mouth at bedtime.    Yes [provider]  spironolactone (ALDACTONE) 25 MG tablet Take 25 mg by mouth daily.   Yes [provider]  traMADol (ULTRAM) 50 MG tablet Take 50 mg by mouth 3 (three) times daily. Pain     Yes [provider]  traZODone (DESYREL) 50 MG tablet Take 50 mg by mouth at bedtime.   Yes [provider]  clindamycin (CLEOCIN) 300 MG capsule Take 1 capsule (300 mg total) by mouth 3 (three) times daily. Patient not taking: Reported on 02/03/2017 01/24/17   Landis Martins, DPM   Dg Chest 2 View  Result Date: 02/03/2017 CLINICAL DATA:  83 y/o  M; 2 days of fever. EXAM: CHEST  2 VIEW COMPARISON:  12/04/2012 chest radiograph FINDINGS: Diffuse coarse reticular opacities of the lungs predominantly at the periphery with patchy opacities in the lower lobes best seen on the lateral radiograph. Mild cardiomegaly. Aortic atherosclerosis with calcification. No pleural effusion or pneumothorax. No acute osseous abnormality is identified. IMPRESSION: Diffuse reticular opacities and patchy bilateral lower lobe opacities may represent pulmonary edema or multifocal pneumonia. No pleural effusion. Aortic atherosclerosis. Mild cardiomegaly. Electronically Signed   By: Kristine Garbe M.D.   On: 02/03/2017 18:12   Dg Foot 2 Views Right  Result Date: 02/03/2017 CLINICAL DATA:  Clinical concern for osteomyelitis.  Fever. EXAM: RIGHT FOOT - 2 VIEW  COMPARISON:  Radiographs 10/31/2016, foot MRI 12/01/2016 FINDINGS: Large plantar skin/soft tissue defect. No radiopaque foreign body. Question of increased periosteal reaction about proximal fourth metatarsal. No other is no evidence of bony destructive change. Charcot changes in the midfoot. Radiographic evaluation for osteomyelitis in this setting is limited. Chronic lateral subluxation of the second and third digits with associated soft tissue edema. Marked dorsal soft tissue edema. IMPRESSION: 1. Large plantar skin/soft tissue defect. 2. Questionable periosteal reaction about the proximal fourth metatarsal, increased from prior exam. Osteomyelitis was seen in this area on prior MRI. No additional radiographic findings of osteomyelitis. 3. Chronic Charcot changes in the midfoot, second and third digit subluxation and multifocal soft tissue edema. Electronically Signed   By: Jeb Levering M.D.   On: 02/03/2017 20:02   - pertinent xrays, CT, MRI studies were reviewed and independently interpreted  Positive ROS: All other systems have been  reviewed and were otherwise negative with the exception of those mentioned in the HPI and as above.  Physical Exam: General: patient is not alert or oriented. He does have dementia. Patient's wife answers all of his questions. Psychiatric: Patient has dementia. Lymphatic: No axillary or cervical lymphadenopathy Cardiovascular: No pedal edema Respiratory: patient is short of breath with nasal cannula FiO2. GI: No organomegaly, abdomen is soft and non-tender  Skin: examination patient has cellulitis of the entire right leg. There is pitting edema with venous insufficiency. Patient has a large chronic ulcer over the plantar aspect of the right hindfoot. There is foul smelling drainage.   Neurologic: Patient does not have protective sensation bilateral lower extremities.   MUSCULOSKELETAL:  Examination patient does not have a palpable dorsalis pedis or  posterior tibial pulse. He has a hind foot ulcer on the plantar aspect of the heel which probes down to bone. There is brawny edema in his foot. Review of the radiographs shows chronic osteomyelitis of the hindfoot and review of the MRI scan shows osteomyelitis involving the calcaneus cuboid and base of the fourth and fifth metatarsals.  Assessment: Assessment: Diabetic insensate neuropathy with peripheral vascular disease venous insufficiency with sepsis from chronic osteomyelitis of the right hindfoot.  Plan: Plan: We will plan for urgent surgical intervention for transtibial amputation on the right. Patient is posted for surgery tomorrow morning at 7:30 at [facility]. Patient will be transferred to [facility]. Patient will require skilled nursing placement postoperatively. I doubt patient has the strength to participate with physical therapy and it is unlikely that he would have the strength to use a prosthesis.  Thank you for the consult and the opportunity to see Preston Carter, [place] [phone] 2:51 PM

## 2017-02-05 NOTE — Anesthesia Procedure Notes (Signed)
Anesthesia Regional Block: Femoral nerve block   Pre-Anesthetic Checklist: ,, timeout performed, Correct Patient, Correct Site, Correct Laterality, Correct Procedure, Correct Position, site marked, Risks and benefits discussed,  Surgical consent,  Pre-op evaluation,  At surgeon's request and post-op pain management  Laterality: Right  Prep: chloraprep       Needles:  Injection technique: Single-shot  Needle Type: Echogenic Needle     Needle Length: 9cm  Needle Gauge: 21     Additional Needles:   Procedures: ultrasound guided,,,,,,,,  Narrative:  Start time: 02/05/2017 7:20 AM End time: 02/05/2017 7:34 AM Injection made incrementally with aspirations every 5 mL.  Performed by: Personally  Anesthesiologist: Suzette Battiest  Additional Notes: Unable to visualize SFA and adductor canal on U/S. Femoral nerve block performed instead.

## 2017-02-05 NOTE — Interval H&P Note (Signed)
History and Physical Interval Note:  02/05/2017 7:39 AM  Preston Carter  has presented today for surgery, with the diagnosis of cellulitis right leg   The various methods of treatment have been discussed with the patient and family. After consideration of risks, benefits and other options for treatment, the patient has consented to  Procedure(s): AMPUTATION BELOW KNEE (Right) as a surgical intervention .  The patient's history has been reviewed, patient examined, no change in status, stable for surgery.  I have reviewed the patient's chart and labs.  Questions were answered to the patient's satisfaction.     Newt Minion

## 2017-02-05 NOTE — Progress Notes (Signed)
There is no CPAP in room. PTs wife also stated that PT does not wear CPAP at home.

## 2017-02-05 NOTE — Anesthesia Procedure Notes (Signed)
Anesthesia Regional Block: Popliteal block   Pre-Anesthetic Checklist: ,, timeout performed, Correct Patient, Correct Site, Correct Laterality, Correct Procedure, Correct Position, site marked, Risks and benefits discussed,  Surgical consent,  Pre-op evaluation,  At surgeon's request and post-op pain management  Laterality: Right  Prep: chloraprep       Needles:  Injection technique: Single-shot  Needle Type: Echogenic Needle     Needle Length: 9cm  Needle Gauge: 21     Additional Needles:   Procedures: ultrasound guided,,,,,,,,  Narrative:  Start time: 02/05/2017 7:12 AM End time: 02/05/2017 7:19 AM Injection made incrementally with aspirations every 5 mL.  Performed by: Personally  Anesthesiologist: Suzette Battiest

## 2017-02-05 NOTE — Consult Note (Signed)
Consultation Note Date: 02/05/2017   Patient Name: Preston Carter  DOB: [birth year]  MRN: [id number]  Age / Sex: 83 y.o., male  PCP: Leanna Battles, MD Referring Physician: Rosita Fire, MD  Reason for Consultation: Establishing goals of care  HPI/Patient Profile: 83 y.o. male  with past medical history of Parkinson's Disease, Dementia, DM and strokes admitted on 02/03/2017 with fever 103 and non healing foot ulcer, now status post amputation. Palliative care consulted for goals of care-I do not see any documentation of advance care planning in the chart, except for a possible HCPOA. No pain issues per patient report.   Clinical Assessment and Goals of Care: Preston Carter has advanced chronic diseases, loss of functional status and will now require skilled care. Plan are for SNF after hospitalization and attempts at rehabilitation.  Patient unable to discuss his condition or goals. I left message for his wife but have not gotten a return call- they have been hesitant about meeting with our team.   SUMMARY OF RECOMMENDATIONS    1. I will continue to try to schedule a family meeting. 2. Scheduled Tylenol for pain 3. Recommend Palliative Care services at SNF- he at minimum needs a MOST form completed if they are willing.      Primary Diagnoses: Present on Admission: . Sepsis ([place]) . COPD-stage 2 . Hypokalemia . Chronic kidney disease (CKD), stage III (moderate) . Normocytic anemia . Hyponatremia   I have reviewed the medical record, interviewed the patient and family, and examined the patient. The following aspects are pertinent.  Past Medical History:  Diagnosis Date  . Anemia 05/14/2011  . Arthritis 05-03-11   most joints- Bil. hips/knees replaced in the past  . Asthma   . DEMENTIA 05-03-11   hx. Alzheimers-tx. Aricept  . Diabetes mellitus 05-03-11   Oral meds used-Dabetes x5  yrs-CBG's (145)  . Hypertension   . Irregular heart beat   . Neuromuscular disorder ([place]) 05-03-11   Dx. Parkinson's -essential tremors-generally, ambulates with walker  . Shortness of breath 05-03-11   SOB with exertion only  . Skin cancer 05-03-11   Past hx. skin cancer lesions-removed  . Stroke [facility]) 05-03-11   TIA- more than 20 yrs ago.   Social History   Social History  . Marital status: Married    Spouse name: N/A  . Number of children: N/A  . Years of education: N/A   Social History Main Topics  . Smoking status: Former Smoker    Packs/day: 1.50    Years: 20.00    Quit date: 06/20/1977  . Smokeless tobacco: Never Used  . Alcohol use None  . Drug use: Unknown  . Sexual activity: No   Other Topics Concern  . None   Social History Narrative  . None   Family History  Problem Relation Age of Onset  . Heart disease Father    Scheduled Meds: . aspirin EC  81 mg Oral Daily  . carbidopa-levodopa  1 tablet Oral TID WC  . Chlorhexidine Gluconate Cloth  6 each Topical Daily  . divalproex  125 mg Oral BID  . docusate sodium  100 mg Oral BID  . enoxaparin (LOVENOX) injection  60 mg Subcutaneous QHS  . entacapone  200 mg Oral TID WC  . guaiFENesin  600 mg Oral BID  . hydrOXYzine  25 mg Oral TID  . insulin aspart  0-20 Units Subcutaneous TID WC  . insulin detemir  45 Units Subcutaneous QHS  . insulin detemir  54 Units Subcutaneous Daily  . loratadine  10 mg Oral BID  . mupirocin ointment  1 application Nasal BID  . potassium chloride SA  20 mEq Oral Daily  . simvastatin  40 mg Oral QHS  . sodium hypochlorite   Irrigation BID  . spironolactone  25 mg Oral Daily  . traZODone  50 mg Oral QHS   Continuous Infusions: . sodium chloride    . cefTRIAXone (ROCEPHIN)  IV Stopped (02/04/17 1751)  . methocarbamol (ROBAXIN)  IV    . vancomycin     PRN Meds:.acetaminophen **OR** acetaminophen, bisacodyl, fentaNYL (SUBLIMAZE) injection, HYDROmorphone (DILAUDID) injection,  magnesium citrate, methocarbamol **OR** methocarbamol (ROBAXIN)  IV, metoCLOPramide **OR** metoCLOPramide (REGLAN) injection, ondansetron **OR** ondansetron (ZOFRAN) IV, oxyCODONE, polyethylene glycol, RESOURCE THICKENUP CLEAR Medications Prior to Admission:  Prior to Admission medications   Medication Sig Start Date End Date Taking? Authorizing Provider  albuterol (PROVENTIL) (2.5 MG/3ML) 0.083% nebulizer solution Take 2.5 mg by nebulization every 4 (four) hours as needed. As directed 05/06/13  Yes [provider]  aspirin EC 81 MG tablet Take 81 mg by mouth daily.   Yes [provider]  carbidopa-levodopa-entacapone (STALEVO) 25-100-200 MG per tablet Take 1 tablet by mouth 3 (three) times daily.    Yes [provider]  divalproex (DEPAKOTE SPRINKLE) 125 MG capsule Take 125 mg by mouth 2 (two) times daily. 11/18/16  Yes [provider]  furosemide (LASIX) 40 MG tablet Take 40 mg by mouth 2 (two) times daily.    Yes [provider]  guaiFENesin-dextromethorphan (ROBITUSSIN DM) 100-10 MG/5ML syrup Take 5 mLs by mouth 3 (three) times daily as needed. cough   Yes [provider]  HUMALOG 100 UNIT/ML injection Inject 16 Units into the skin 2 (two) times daily. 01/01/17  Yes [provider]  hydrOXYzine (ATARAX/VISTARIL) 25 MG tablet Take 25 mg by mouth 3 (three) times daily.   Yes [provider]  ipratropium (ATROVENT) 0.02 % nebulizer solution Take 0.5 mg by nebulization every 4 (four) hours as needed for wheezing. As directed 05/14/13  Yes [provider]  LEVEMIR FLEXTOUCH 100 UNIT/ML Pen Inject 45-54 Units into the skin 2 (two) times daily. Inject 54 u in the am and 45 u in the evening 10/28/16  Yes [provider]  loratadine (CLARITIN) 10 MG tablet Take 10 mg by mouth 2 (two) times daily.    Yes [provider]  Omega-3 Fatty Acids (FISH OIL PO) Take 2 capsules by mouth daily.   Yes [provider]  potassium chloride SA (K-DUR,KLOR-CON) 20 MEQ tablet Take 20 mEq by mouth daily. Takes 1 tablets oral every day   Yes [provider]  simvastatin (ZOCOR) 40 MG tablet Take 40 mg by mouth at bedtime.    Yes [provider]  spironolactone (ALDACTONE) 25 MG tablet Take 25 mg by mouth daily.   Yes [provider]  traMADol (ULTRAM) 50 MG tablet Take 50 mg by mouth 3 (three) times daily. Pain     Yes  [provider]  traZODone (DESYREL) 50 MG tablet Take 50 mg by mouth at bedtime.   Yes [provider]  clindamycin (CLEOCIN) 300 MG capsule Take 1 capsule (300 mg total) by mouth 3 (three) times daily. Patient not taking: Reported on 02/03/2017 01/24/17   Landis Martins, DPM   No Known Allergies Review of Systems  Physical Exam  Vital Signs: BP 127/62 (BP Location: Left Arm)   Pulse 91   Temp 97.8 F (36.6 C)   Resp 18   Ht 5\' 11"  (1.803 m)   Wt 135.9 kg (299 lb 8 oz)   SpO2 92%   BMI 41.77 kg/m  Pain Assessment: Faces   Pain Score: 0-No pain   SpO2: SpO2: 92 % O2 Device:SpO2: 92 % O2 Flow Rate: .O2 Flow Rate (L/min): 4 L/min  IO: Intake/output summary:  Intake/Output Summary (Last 24 hours) at 02/05/17 1502 Last data filed at 02/05/17 1143  Gross per 24 hour  Intake              460 ml  Output              775 ml  Net             -315 ml    LBM: Last BM Date: 02/03/17 Baseline Weight: Weight: (!) 137.4 kg (303 lb) Most recent weight: Weight: 135.9 kg (299 lb 8 oz)     Palliative Assessment/Data:     Time Total: 35 min Greater than 50%  of this time was spent counseling and coordinating care related to the above assessment and plan.  Signed by: Lane Hacker, DO   Please contact Palliative Medicine Team phone at [phone] for questions and concerns.  For individual provider: See Shea Evans

## 2017-02-05 NOTE — Progress Notes (Signed)
Speech Language Pathology Treatment: Dysphagia  Patient Details Name: Preston Carter MRN: [id number] DOB: 02-Feb-1932 Today's Date: 02/05/2017 Time: 4259-5638 SLP Time Calculation (min) (ACUTE ONLY): 20 min  Assessment / Plan / Recommendation Clinical Impression  Patient seen for dysphagia follow-up. SLP recommended NPO due to altered mentation, decreased arousal yesterday. Pt is now s/p transtibial amputation, on regular diet with thin liquids per MD. Pt is alert, following commands, oriented x2 (baseline dementia) with multiple family members at bedside. Pt was intubated yesterday for procedure only, now on [place]. Voice is clear and slightly gravelly, cough strong. He does have a productive cough at baseline. Assessed pt tolerance of thin liquids; he is extremely impulsive (consuming approximately 6oz via straw in consecutive swallows despite cues for single sip). He does present with delayed throat clearing, wet vocal quality, suggestive of reduced airway protection. These signs persist even when SLP limits pt to single sips by removing straw. No overt signs of aspiration with nectar thick liquids, pureed solids. Pt is edentulous, requiring extended time for mastication of solids and with intermittent delayed cough. Recommend downgrade to dys 3 with nectar thick liquids, meds whole with liquid. SLP provided education to pt, family re: risks for aspiration, silent aspiration with PD, and role of instrumental assessments in diagnosis and treatment planning for dysphagia. Based on clinical presentation, pt may benefit from FEES for objective evaluation of pt's swallowing function. Will f/u next date for assessment of diet tolerance, determine appropriateness for FEES vs advancement of liquids.     HPI HPI: 83 y.o. male with medical history significant of Parkinson's disease, dementia, HTN, DM type II, H/O MRSA infections, and TIA/stroke; who presents with complaints of fever off and on for the last two weeks.   History is obtained from the patient's wife was present at bedside. Today fever got up as high as 101.24F.He has been dealing with right foot ulcer for quite some time. Patient had recently completed a 10 day course of clindamycin for right foot ulcer given by Dr. Cannon Kettle of podiatry. Dr. Cannon Kettle had also scheduled him ABI testing on the 24th of this month. He had also recently checked a MRI which showed signs of osteomyelitis. He has been having wound care come out twice a week for some unknown time period to wrap his legs and monitor the foot ulcer that has been present for 3 years. Wife states that it has been chronically draining, although the odor may have worsened recently. Patient has a chronic cough that is productive, but has been worse in the last 2 days. Associated symptoms include decreased appetite, worsened tremor, generalized weakness, and 2 falls in last week      SLP Plan  Continue with current plan of care;Other (Comment) (FEES)       Recommendations  Diet recommendations: Dysphagia 3 (mechanical soft);Nectar-thick liquid Liquids provided via: Cup;Straw Medication Administration: Whole meds with puree Supervision: Staff to assist with self feeding Compensations: Slow rate;Small sips/bites;Minimize environmental distractions;Clear throat intermittently Postural Changes and/or Swallow Maneuvers: Seated upright 90 degrees                Oral Care Recommendations: Oral care BID Follow up Recommendations: Skilled Nursing facility SLP Visit Diagnosis: Dysphagia, unspecified (R13.10) Plan: Continue with current plan of care;Other (Comment) (FEES)       GO              Deneise Lever, [place], [place] Speech-Language Pathologist [phone]  Aliene Altes 02/05/2017, 1:28 PM

## 2017-02-05 NOTE — Progress Notes (Signed)
NURSING PROGRESS NOTE  ALANTE TOLAN [id number] Transfer Data: 02/05/2017 9:51 AM Attending Provider: Rosita Fire, MD GEX:[id number], Quillian Quince, MD Code Status: DNR   ADIN LAKER is a 83 y.o. male patient transferred from PACU -No acute distress noted.  -No complaints of shortness of breath.  -No complaints of chest pain.   Cardiac Monitoring: Box # 02 in place. Cardiac monitor yields:A Fib in 90's  Blood pressure (!) 138/53, pulse 91, temperature 98 F (36.7 C), resp. rate 20, height 5\' 11"  (1.803 m), weight 135.9 kg (299 lb 8 oz), SpO2 92 %.   IV Fluids:  IV in place, occlusive dsg intact without redness, IV cath LR @ TKO.   Allergies:  Patient has no known allergies.  Past Medical History:   has a past medical history of Anemia (05/14/2011); Arthritis (05-03-11); Asthma; DEMENTIA (05-03-11); Diabetes mellitus (05-03-11); Hypertension; Irregular heart beat; Neuromuscular disorder ([place]) (05-03-11); Shortness of breath (05-03-11); Skin cancer (05-03-11); and Stroke ([place]) (05-03-11).  Past Surgical History:   has a past surgical history that includes Joint replacement (05-03-11); Tonsillectomy; Uvulopalatopharyngoplasty (05-03-11); Cholecystectomy; Eye surgery (05-03-11); Carpal tunnel release (05-03-11); Total hip revision (05/11/2011); and loop recorder implant (N/A, 12/05/2012).  Social History:   reports that he quit smoking about 39 years ago. He has a 30.00 pack-year smoking history. He has never used smokeless tobacco.  Skin: multiple issues see previous notes and flowsheeL Has BKA from surgery, it is wrapped and hooked to a wound vac.with sero-sang drainage noted.  Patient/Family orientated to room. Information packet given to patient/family. Admission inpatient armband information verified with patient/family to include name and date of birth and placed on patient arm. Side rails up x 2, fall assessment and education completed with patient/family. Patient/family able to  verbalize understanding of risk associated with falls and verbalized understanding to call for assistance before getting out of bed. Call light within reach. Patient/family able to voice and demonstrate understanding of unit orientation instructions.    Will continue to evaluate and treat per MD orders.

## 2017-02-06 ENCOUNTER — Encounter (HOSPITAL_COMMUNITY): Payer: Self-pay | Admitting: Orthopedic Surgery

## 2017-02-06 DIAGNOSIS — Z87891 Personal history of nicotine dependence: Secondary | ICD-10-CM

## 2017-02-06 DIAGNOSIS — Z89511 Acquired absence of right leg below knee: Secondary | ICD-10-CM

## 2017-02-06 DIAGNOSIS — I129 Hypertensive chronic kidney disease with stage 1 through stage 4 chronic kidney disease, or unspecified chronic kidney disease: Secondary | ICD-10-CM

## 2017-02-06 LAB — BASIC METABOLIC PANEL
ANION GAP: 9 (ref 5–15)
BUN: 34 mg/dL — ABNORMAL HIGH (ref 6–20)
CHLORIDE: 93 mmol/L — AB (ref 101–111)
CO2: 29 mmol/L (ref 22–32)
Calcium: 7.9 mg/dL — ABNORMAL LOW (ref 8.9–10.3)
Creatinine, Ser: 1.43 mg/dL — ABNORMAL HIGH (ref 0.61–1.24)
GFR calc non Af Amer: 44 mL/min — ABNORMAL LOW (ref >60)
GFR, EST AFRICAN AMERICAN: 51 mL/min — AB (ref >60)
GLUCOSE: 316 mg/dL — AB (ref 65–99)
Potassium: 3.3 mmol/L — ABNORMAL LOW (ref 3.5–5.1)
Sodium: 131 mmol/L — ABNORMAL LOW (ref 135–145)

## 2017-02-06 LAB — CBC
HEMATOCRIT: 26.3 % — AB (ref 39.0–52.0)
HEMOGLOBIN: 8.9 g/dL — AB (ref 13.0–17.0)
MCH: 27 pg (ref 26.0–34.0)
MCHC: 33.8 g/dL (ref 30.0–36.0)
MCV: 79.7 fL (ref 78.0–100.0)
Platelets: 235 10*3/uL (ref 150–400)
RBC: 3.3 MIL/uL — ABNORMAL LOW (ref 4.22–5.81)
RDW: 16.5 % — ABNORMAL HIGH (ref 11.5–15.5)
WBC: 21 10*3/uL — ABNORMAL HIGH (ref 4.0–10.5)

## 2017-02-06 LAB — GLUCOSE, CAPILLARY
GLUCOSE-CAPILLARY: 245 mg/dL — AB (ref 65–99)
GLUCOSE-CAPILLARY: 341 mg/dL — AB (ref 65–99)
GLUCOSE-CAPILLARY: 381 mg/dL — AB (ref 65–99)
Glucose-Capillary: 370 mg/dL — ABNORMAL HIGH (ref 65–99)
Glucose-Capillary: 379 mg/dL — ABNORMAL HIGH (ref 65–99)

## 2017-02-06 MED ORDER — POTASSIUM CHLORIDE CRYS ER 20 MEQ PO TBCR
40.0000 meq | EXTENDED_RELEASE_TABLET | Freq: Every day | ORAL | Status: DC
  Administered 2017-02-06: 40 meq via ORAL
  Filled 2017-02-06: qty 2

## 2017-02-06 MED ORDER — VANCOMYCIN HCL 10 G IV SOLR
1250.0000 mg | INTRAVENOUS | Status: AC
  Administered 2017-02-07 – 2017-02-08 (×2): 1250 mg via INTRAVENOUS
  Filled 2017-02-06 (×2): qty 1250

## 2017-02-06 MED ORDER — POTASSIUM CHLORIDE CRYS ER 20 MEQ PO TBCR
40.0000 meq | EXTENDED_RELEASE_TABLET | Freq: Two times a day (BID) | ORAL | Status: DC
  Administered 2017-02-06 – 2017-02-07 (×2): 40 meq via ORAL
  Filled 2017-02-06 (×2): qty 2

## 2017-02-06 MED ORDER — INSULIN ASPART 100 UNIT/ML ~~LOC~~ SOLN
10.0000 [IU] | Freq: Once | SUBCUTANEOUS | Status: AC
  Administered 2017-02-06: 10 [IU] via SUBCUTANEOUS

## 2017-02-06 NOTE — Progress Notes (Signed)
There is no cpap in room. Pt refused when askking if he wanted to wear it.

## 2017-02-06 NOTE — Progress Notes (Signed)
RN notified provider patient's CBG is 379, patient received 45 units Levemir, but no HS coverage ordered. Order received for patient to receive 10 units Novolog.  P.J. Linus Mako, RN

## 2017-02-06 NOTE — Progress Notes (Signed)
Physical Therapy Treatment Patient Details Name: Preston Carter MRN: [id number] DOB: [birth year] Today's Date: 02/06/2017    History of Present Illness 83 yo male admitted with sepsis. Hx of R foot ulcer, dementia, Parkinson's, DM, MRSA, TIA, CVA, frequent falls. Underwent R BKA on 02/05/17    PT Comments    Pt does not remember that he had BKA, reviewed surgery with him as well as positioning and therapeutic exercises. Max A +2 for rolling in bed and then maximove used for bed to chair transfer. Pt glad to be OOB. PT will continue to follow.    Follow Up Recommendations  SNF     Equipment Recommendations  None recommended by PT    Recommendations for Other Services       Precautions / Restrictions Precautions Precautions: Fall Precaution Comments: multiple wounds over entire body, very fragile skin Restrictions Weight Bearing Restrictions: No Other Position/Activity Restrictions: wound vac RLE    Mobility  Bed Mobility Overal bed mobility: Needs Assistance Bed Mobility: Rolling Rolling: +2 for physical assistance;Max assist         General bed mobility comments: pt able to assist with rolling by reaching for opposite rail and then able to grasp to hold in SL position. But max A +2 needed for achieving full SL, especially to hips/ lower body  Transfers Overall transfer level: Needs assistance Equipment used: Ambulation equipment used             General transfer comment: maximove used for bed to chair transfer  Ambulation/Gait                 Stairs            Wheelchair Mobility    Modified Rankin (Stroke Patients Only)       Balance                                            Cognition Arousal/Alertness: Lethargic;Suspect due to medications Behavior During Therapy: [facility] for tasks assessed/performed Overall Cognitive Status: History of cognitive impairments - at baseline                                        Exercises Amputee Exercises Quad Sets: AROM;Right;Supine;10 reps Knee Flexion: AAROM;Right;5 reps;Supine Straight Leg Raises: AAROM;Right;10 reps;Supine Chair Push Up: Limitations Chair Push Up Limitations: attempted but unable    General Comments General comments (skin integrity, edema, etc.): very thin, fragile skin      Pertinent Vitals/Pain Pain Assessment: Faces Faces Pain Scale: Hurts even more Pain Location: surgical site with motion Pain Descriptors / Indicators: Aching Pain Intervention(s): Limited activity within patient's tolerance;Monitored during session;Premedicated before session    Home Living                      Prior Function            PT Goals (current goals can now be found in the care plan section) Acute Rehab PT Goals Patient Stated Goal: none stated PT Goal Formulation: With family Time For Goal Achievement: 02/18/17 Potential to Achieve Goals: Fair Progress towards PT goals: Progressing toward goals    Frequency    Min 2X/week      PT Plan Frequency needs to be updated  Co-evaluation              AM-PAC PT "6 Clicks" Daily Activity  Outcome Measure  Difficulty turning over in bed (including adjusting bedclothes, sheets and blankets)?: Unable Difficulty moving from lying on back to sitting on the side of the bed? : Unable Difficulty sitting down on and standing up from a chair with arms (e.g., wheelchair, bedside commode, etc,.)?: Unable Help needed moving to and from a bed to chair (including a wheelchair)?: Total Help needed walking in hospital room?: Total Help needed climbing 3-5 steps with a railing? : Total 6 Click Score: 6    End of Session Equipment Utilized During Treatment: Oxygen Activity Tolerance: Patient limited by fatigue Patient left: in chair;with call bell/phone within reach Nurse Communication: Mobility status;Need for lift equipment PT Visit Diagnosis: Muscle weakness (generalized)  (M62.81);Difficulty in walking, not elsewhere classified (R26.2)     Time: 7373-6681 PT Time Calculation (min) (ACUTE ONLY): 37 min  Charges:  $Therapeutic Activity: 23-37 mins                    G Codes:       [place]  [place] 02/06/2017, 1:10 PM

## 2017-02-06 NOTE — Progress Notes (Signed)
CSW consulted Ortho to see if patient could be switched to a Pravena wound vac that could go with him to snf.  Percell Locus Athalia Setterlund LCSWA [phone]

## 2017-02-06 NOTE — Progress Notes (Signed)
Pharmacy Antibiotic Note Pharmacy consulted to dose vancomcyin for Sepsis due to right foot ulcer/with gangrene and subacute osteomyelitis. Now s/p transtibial amputation and application of wound VAC by Dr. Sharol Given. Wt has changed since admission (likely mostly d/t amputation) and Scr trending down.  Plan: Continue ceftriaxone 2g IV q24h. Adjust vancomycin to 1250 mg IV q24h for normalized CrCl~39 ml/min. Monitor clinical progress, cultures/sensitivities, renal function, abx plan Vancomycin trough as indicated  Height: 5\' 11"  (180.3 cm) Weight: 258 lb 14.4 oz (117.4 kg) (took wound vac off) IBW/kg (Calculated) : 75.3  Temp (24hrs), Avg:98.8 F (37.1 C), Min:97.8 F (36.6 C), Max:99.6 F (37.6 C)   Recent Labs Lab 02/03/17 1816 02/03/17 1831 02/04/17 0601 02/04/17 0749 02/05/17 1007 02/06/17 0338  WBC 21.3*  --   --  20.0* 16.6* 21.0*  CREATININE 1.89*  --  1.67*  --   --  1.43*  LATICACIDVEN  --  1.67  --   --   --   --     Estimated Creatinine Clearance: 51 mL/min (A) (by C-G formula based on SCr of 1.43 mg/dL (H)).    No Known Allergies  Antimicrobials this admission:  8/17 azithromycin >>8/17 8/17 ceftriaxone >> 8/17 vancomycin >>  Dose adjustments this admission:  8/20: adjusted vancomycin dose d/t weight change and SCr trending down  Microbiology results:  8/17 BCx: ngtd 8/18 Surgical PCR MRSA +   Thank you for allowing [place] to participate in this patients care.  Jens Som, PharmD Clinical phone for 02/06/2017 from 7a-3:30p: [phone] If after 3:30p, please call main pharmacy at: [phone] 02/06/2017 11:37 AM

## 2017-02-06 NOTE — Progress Notes (Signed)
  Speech Language Pathology Treatment: Dysphagia  Patient Details Name: Preston Carter MRN: [id number] DOB: 01/27/1932 Today's Date: 02/06/2017 Time: 3016-0109 SLP Time Calculation (min) (ACUTE ONLY): 16 min  Assessment / Plan / Recommendation Clinical Impression  F/u for diet tolerance and necessity for instrumental exam. Observed patient with consumption of breakfast tray, required full assistance today for feeding due to UE weakness. Patient continues to present with prolonged but functional mastication of bolus bolus with full oral clearance when provided with extra time. Consuming nectar thick liquid via straw with moderate cueing for slowed rate in intake and decreased sip size. Vocal quality hoarse at baseline, remaining hoarse through out treatment with no changes to indicate decreased airway protection. Intermittent delayed coughing is noted, difficult to distinguish as aspiration given baseline congested cough. Noted CXR 8/17: Diffuse reticular opacities and patchy bilateral lower lobe opacities may represent pulmonary edema or multifocal pneumonia.  Given h/o Parkinson's disease, dementia, and O2 requirements/respiratory decline which impact ability coordinate respirations with swallow, recommend instrumental exam to determine degree of dysphagia and ensure least restrictive diet. Unable to complete this date. Will plan for 8/21.    HPI HPI: 83 y.o. male with medical history significant of Parkinson's disease, dementia, HTN, DM type II, H/O MRSA infections, and TIA/stroke; who presents with complaints of fever off and on for the last two weeks.  History is obtained from the patient's wife was present at bedside. Today fever got up as high as 101.20F.He has been dealing with right foot ulcer for quite some time. Patient had recently completed a 10 day course of clindamycin for right foot ulcer given by Dr. Cannon Kettle of podiatry. Dr. Cannon Kettle had also scheduled him ABI testing on the 24th of this month.  He had also recently checked a MRI which showed signs of osteomyelitis. He has been having wound care come out twice a week for some unknown time period to wrap his legs and monitor the foot ulcer that has been present for 3 years. Wife states that it has been chronically draining, although the odor may have worsened recently. Patient has a chronic cough that is productive, but has been worse in the last 2 days. Associated symptoms include decreased appetite, worsened tremor, generalized weakness, and 2 falls in last week      SLP Plan  Continue with current plan of care;Other (Comment) (FEES)       Recommendations  Diet recommendations: Dysphagia 3 (mechanical soft);Nectar-thick liquid Liquids provided via: Cup;Straw Medication Administration: Whole meds with puree Supervision: Staff to assist with self feeding Compensations: Slow rate;Small sips/bites;Minimize environmental distractions;Clear throat intermittently Postural Changes and/or Swallow Maneuvers: Seated upright 90 degrees                Oral Care Recommendations: Oral care BID Follow up Recommendations: Skilled Nursing facility SLP Visit Diagnosis: Dysphagia, unspecified (R13.10) Plan: Continue with current plan of care;Other (Comment) (FEES)       [place], CCC-SLP [phone]    Essynce Munsch Meryl 02/06/2017, 9:55 AM

## 2017-02-06 NOTE — Progress Notes (Addendum)
PROGRESS NOTE    Preston Carter  ZOX:[id number] DOB: [birth year] DOA: 02/03/2017 PCP: Leanna Battles, MD   Brief Narrative: 83 year old male with history of Parkinson disease, COPD on home oxygen, essential hypertension, type 2 diabetes on insulin presented with fever for about 2 weeks. In the ER patient was found to have temperatures of 101.3. Patient with right foot ulcer and completed 10 days of clindamycin before admission. MRI showed osteomyelitis. Patient was transferred to [facility] from [facility] for orthopedics surgery. Status post transtibial amputation and application of wound VAC by Dr. Sharol Given.  Assessment & Plan:  # Sepsis due to right foot ulcer/with gangrene and subacute osteomyelitis: -Patient failed outpatient antibiotics treatment. Status post transtibial amputation and application of wound VAC by Dr. Sharol Given on August 19. -Continue wound care - Currently on IV vancomycin and ceftriaxone. Leukocytosis worsened today likely due to surgical distress. Patient is afebrile. Follow up culture results. Infectious disease was consulted for antibiotic management.  -Pain management, PT OT evaluation. Patient likely needs to go to skilled facility. Discussed with the Education officer, museum.  #Hyponatremia and hypokalemia: Monitor electrolytes. Replete potassium chloride. Monitor labs.  #Chronic kidney disease is stage III: Serum creatinine level is improving. Monitor BMP. Avoid nephrotoxins  # Acute anemia likely due to acute blood loss during surgery: Monitor CBC. No sign of active bleeding.  #Multiple sacral decubitus ulcer stage II: Continue wound care  #OSA: Continue CPAP at night  #Type 2 diabetes on chronic insulin: Continue current insulin regimen. Monitor blood sugar level. On dysphagia diet.  #Parkinson disease: Continue Sinemet. Palliative care consult follow-up  # COPD with chronic respiratory failure: Continue nebulization and oxygen treatment.  DVT prophylaxis:lovenox  sq Code Status: Full code Family Communication: Discussed with multiple family members at bedside yesterday Disposition Plan: Likely discharge to skilled facility in 1-2 days. PT OT evaluation. Social Environmental manager.    Consultants:   Orthopedics  Infectious disease  Procedures:transtibial amputation and application of wound VAC by Dr. Sharol Given. Antimicrobials: Vancomycin and ceftriaxone Subjective: Seen and examined at bedside. Denied headache, dizziness, nausea vomiting chest pain shortness of breath. No leg pain.  Objective: Vitals:   02/05/17 1537 02/05/17 2048 02/06/17 0546 02/06/17 0900  BP: (!) 126/57 (!) 149/62 138/66   Pulse: 75 83 (!) 108   Resp: 16 18 (!) 28   Temp: 98.6 F (37 C) 99.6 F (37.6 C) 99.2 F (37.3 C)   TempSrc: Oral Oral Oral   SpO2: 100% 100% 99%   Weight:    117.4 kg (258 lb 14.4 oz)  Height:        Intake/Output Summary (Last 24 hours) at 02/06/17 1132 Last data filed at 02/06/17 1000  Gross per 24 hour  Intake          1100.67 ml  Output             2375 ml  Net         -1274.33 ml   Filed Weights   02/04/17 0455 02/05/17 0500 02/06/17 0900  Weight: (!) 136.9 kg (301 lb 12.8 oz) 135.9 kg (299 lb 8 oz) 117.4 kg (258 lb 14.4 oz)    Examination:  General exam: Wasn't elderly male lying on bed comfortable Respiratory system: Clear bilateral. Respiratory effort normal. No wheezing or crackle Cardiovascular system: Regular rate rhythm S1-S2 normal.   Gastrointestinal system: Abdomen is  soft and nontender. Normal bowel sounds heard. Central nervous system: Alert Awake and following commands.  Musculoskeletal: Right amputation site with  dressing and wound VAC. No sign of bleeding and unchanged physical exam  Data Reviewed: I have personally reviewed following labs and imaging studies  CBC:  Recent Labs Lab 02/03/17 1816 02/04/17 0749 02/05/17 1007 02/06/17 0338  WBC 21.3* 20.0* 16.6* 21.0*  NEUTROABS 17.8*  --   --   --   HGB  11.0* 10.9* 10.6* 8.9*  HCT 31.2* 31.2* 30.8* 26.3*  MCV 79.0 79.6 79.8 79.7  PLT 206 210 225 518   Basic Metabolic Panel:  Recent Labs Lab 02/03/17 1816 02/04/17 0601 02/06/17 0338  NA 129* 131* 131*  K 3.1* 3.6 3.3*  CL 86* 89* 93*  CO2 30 30 29   GLUCOSE 126* 60* 316*  BUN 63* 58* 34*  CREATININE 1.89* 1.67* 1.43*  CALCIUM 8.8* 8.8* 7.9*  MG  --  2.2  --    GFR: Estimated Creatinine Clearance: 51 mL/min (A) (by C-G formula based on SCr of 1.43 mg/dL (H)). Liver Function Tests:  Recent Labs Lab 02/03/17 1816 02/04/17 0601  AST 70* 77*  ALT 11* 14*  ALKPHOS 159* 154*  BILITOT 0.7 1.0  PROT 7.3 7.1  ALBUMIN 2.6* 2.5*   No results for input(s): LIPASE, AMYLASE in the last 168 hours. No results for input(s): AMMONIA in the last 168 hours. Coagulation Profile:  Recent Labs Lab 02/03/17 2215  INR 1.10   Cardiac Enzymes: No results for input(s): CKTOTAL, CKMB, CKMBINDEX, TROPONINI in the last 168 hours. BNP (last 3 results) No results for input(s): PROBNP in the last 8760 hours. HbA1C:  Recent Labs  02/04/17 0601  HGBA1C 7.9*   CBG:  Recent Labs Lab 02/05/17 0848 02/05/17 1145 02/05/17 1802 02/05/17 2354 02/06/17 0935  GLUCAP 168* 177* 389* 379* 245*   Lipid Profile: No results for input(s): CHOL, HDL, LDLCALC, TRIG, CHOLHDL, LDLDIRECT in the last 72 hours. Thyroid Function Tests: No results for input(s): TSH, T4TOTAL, FREET4, T3FREE, THYROIDAB in the last 72 hours. Anemia Panel: No results for input(s): VITAMINB12, FOLATE, FERRITIN, TIBC, IRON, RETICCTPCT in the last 72 hours. Sepsis Labs:  Recent Labs Lab 02/03/17 1831 02/03/17 2215  PROCALCITON  --  2.35  LATICACIDVEN 1.67  --     Recent Results (from the past 240 hour(s))  Blood Culture (routine x 2)     Status: None (Preliminary result)   Collection Time: 02/03/17  6:15 PM  Result Value Ref Range Status   Specimen Description BLOOD RIGHT ANTECUBITAL  Final   Special Requests    Final    BOTTLES DRAWN AEROBIC AND ANAEROBIC Blood Culture adequate volume   Culture   Final    NO GROWTH 3 DAYS Performed at [facility], [address], [place], [place] [address]    Report Status PENDING  Incomplete  Blood Culture (routine x 2)     Status: None (Preliminary result)   Collection Time: 02/03/17  6:20 PM  Result Value Ref Range Status   Specimen Description BLOOD BLOOD RIGHT FOREARM  Final   Special Requests IN PEDIATRIC BOTTLE Blood Culture adequate volume  Final   Culture   Final    NO GROWTH 3 DAYS Performed at [facility], [place] [address], [place], [place] [address]    Report Status PENDING  Incomplete  Surgical pcr screen     Status: Abnormal   Collection Time: 02/04/17  8:40 PM  Result Value Ref Range Status   MRSA, PCR NEGATIVE NEGATIVE Final   Staphylococcus aureus POSITIVE (A) NEGATIVE Final  Comment:        The Xpert SA Assay (FDA approved for NASAL specimens in patients over 91 years of age), is one component of a comprehensive surveillance program.  Test performance has been validated by [facility] for patients greater than or equal to 75 year old. It is not intended to diagnose infection nor to guide or monitor treatment.          Radiology Studies: No results found.      Scheduled Meds: . aspirin EC  81 mg Oral Daily  . carbidopa-levodopa  1 tablet Oral TID WC  . Chlorhexidine Gluconate Cloth  6 each Topical Daily  . divalproex  125 mg Oral BID  . docusate sodium  100 mg Oral BID  . enoxaparin (LOVENOX) injection  60 mg Subcutaneous QHS  . entacapone  200 mg Oral TID WC  . guaiFENesin  600 mg Oral BID  . hydrOXYzine  25 mg Oral TID  . insulin aspart  0-20 Units Subcutaneous TID WC  . insulin detemir  45 Units Subcutaneous QHS  . insulin detemir  54 Units Subcutaneous Daily  . loratadine  10 mg Oral BID  . mupirocin ointment  1 application Nasal BID  . potassium chloride SA  40 mEq Oral Daily  . simvastatin   40 mg Oral QHS  . sodium hypochlorite   Irrigation BID  . spironolactone  25 mg Oral Daily  . traZODone  50 mg Oral QHS   Continuous Infusions: . sodium chloride 10 mL/hr at 02/05/17 1756  . cefTRIAXone (ROCEPHIN)  IV Stopped (02/05/17 1839)  . methocarbamol (ROBAXIN)  IV    . vancomycin Stopped (02/06/17 0139)     LOS: 3 days    Aedin Jeansonne Tanna Furry, MD [facility] Pager [phone]  If 7PM-7AM, please contact night-coverage [email or web address] Password [email or web address] 02/06/2017, 11:32 AM

## 2017-02-06 NOTE — Clinical Social Work Note (Signed)
Clinical Social Work Assessment  Patient Details  Name: Preston Carter MRN: [id number] Date of Birth: 1931-08-09  Date of referral:  02/06/17               Reason for consult:  Facility Placement                Permission sought to share information with:  Facility Sport and exercise psychologist, Family Supports Permission granted to share information::  Yes, Verbal Permission Granted  Name::     Pamala Hurry  Agency::  SNFs  Relationship::  Spouse  Contact Information:  [phone]  Housing/Transportation Living arrangements for the past 2 months:  [place] of Information:  Spouse Patient Interpreter Needed:  None Criminal Activity/Legal Involvement Pertinent to Current Situation/Hospitalization:  No - Comment as needed Significant Relationships:  Spouse Lives with:  Spouse Do you feel safe going back to the place where you live?  No Need for family participation in patient care:  Yes (Comment)  Care giving concerns:  CSW received consult for possible SNF placement at time of discharge. CSW spoke with patient's spouse regarding PT recommendation of SNF placement at time of discharge. Patient's spouse reported that she is currently unable to care for patient at their home given patient's current physical needs and fall risk. Patient's spouse expressed understanding of PT recommendation and is agreeable to SNF placement at time of discharge. CSW to continue to follow and assist with discharge planning needs.   Social Worker assessment / plan:  CSW spoke with patient's spouse concerning possibility of rehab at [facility] before returning home.  Employment status:  Retired Nurse, adult PT Recommendations:  [place] / Referral to community resources:  [place]  Patient/Family's Response to care:  Patient's spouse recognizes need for rehab before returning home and is agreeable to a SNF in [place]. She  reported that she called the insurance company and they are in network with [facility], so that is her preference since patient has been there before.  Patient/Family's Understanding of and Emotional Response to Diagnosis, Current Treatment, and Prognosis:  Patient/family is realistic regarding therapy needs and expressed being hopeful for SNF placement. Patient's spouse expressed understanding of CSW role and discharge process as well as patient's medical condition. She expressed that she cannot take care of him at this time. No questions/concerns about plan or treatment.    Emotional Assessment Appearance:  Appears stated age Attitude/Demeanor/Rapport:  Unable to Assess Affect (typically observed):  Unable to Assess Orientation:  Oriented to Self Alcohol / Substance use:  Not Applicable Psych involvement (Current and /or in the community):  No (Comment)  Discharge Needs  Concerns to be addressed:  Care Coordination Readmission within the last 30 days:  No Current discharge risk:  None Barriers to Discharge:  Continued Medical Work up   [employer], [place] 02/06/2017, 10:33 AM

## 2017-02-06 NOTE — Progress Notes (Signed)
Results for EWARD, RUTIGLIANO (MRN [id number]) as of 02/06/2017 14:40  Ref. Range 02/05/2017 11:45 02/05/2017 18:02 02/05/2017 23:54 02/06/2017 09:35 02/06/2017 12:00  Glucose-Capillary Latest Ref Range: 65 - 99 mg/dL 177 (H) 389 (H) 379 (H) 245 (H) 341 (H)  Noted that postprandial blood sugars are greater than 180 mg/dl. Recommend adding Novolog 4-5 units TID with meals if patient eats at least 50% of meal and if blood sugars continue to be elevated.  Will continue to monitor while in the hospital.   Harvel Ricks RN BSN CDE Diabetes Coordinator Pager: [phone]  8am-5pm

## 2017-02-06 NOTE — NC FL2 (Signed)
[place] LEVEL OF CARE SCREENING TOOL     IDENTIFICATION  Patient Name: Preston Carter Birthdate: 09/08/1931 Sex: male Admission Date (Current Location): 02/03/2017  [facility] and [place] Number:  Herbalist and Address:  [facility]. [facility], [place] [address] [place], [place] [address]      Provider Number: [id number]  Attending Physician Name and Address:  Rosita Fire, MD  Relative Name and Phone Number:  Pamala Hurry, spouse, [phone]    Current Level of Care: Hospital Recommended Level of Care: [place] Prior Approval Number:    Date Approved/Denied:   PASRR Number:   [id number]  Discharge Plan: SNF    Current Diagnoses: Patient Active Problem List   Diagnosis Date Noted  . Hypokalemia 02/04/2017  . Type 2 diabetes mellitus with foot ulcer ([place]) 02/04/2017  . AKI (acute kidney injury) ([place]) 02/04/2017  . Wet gangrene ([place]) 02/04/2017  . Osteomyelitis of right foot ([place]) 02/04/2017  . Subacute osteomyelitis, right ankle and foot ([place])   . Cellulitis of right lower extremity   . Sepsis ([place]) 02/03/2017  . Implantable loop recorder--LINQ 05/28/2013  . Fall 12/05/2012  . Sinoatrial node dysfunction ([place]) 12/05/2012  . Chronic kidney disease (CKD), stage III (moderate) 12/05/2012  . COPD-stage 2 05/18/2011  . Hyponatremia 05/14/2011  . Fever 05/14/2011  . Normocytic anemia 05/14/2011  . DIABETES MELLITUS 03/04/2008  . EXOGENOUS OBESITY 03/04/2008  . Obstructive sleep apnea 03/04/2008  . Seasonal and perennial allergic rhinitis 03/04/2008  . Allergic-infective asthma 03/04/2008  . PERIPHERAL EDEMA 03/04/2008    Orientation RESPIRATION BLADDER Height & Weight     Self  O2 (Nasal cannula 4L), CPAP no home machine Continent Weight: 135.9 kg (299 lb 8 oz) Height:  5\' 11"  (180.3 cm)  BEHAVIORAL SYMPTOMS/MOOD NEUROLOGICAL BOWEL NUTRITION STATUS      Continent Diet (Please see DC Summary)  AMBULATORY  STATUS COMMUNICATION OF NEEDS Skin   Extensive Assist Verbally PU Stage and Appropriate Care, Surgical wounds, Wound Vac (Pressure injury stage II on buttocks; closed incision on leg; wound on foot; )                       Personal Care Assistance Level of Assistance  Bathing, Feeding, Dressing Bathing Assistance: Maximum assistance Feeding assistance: Limited assistance Dressing Assistance: Limited assistance     Functional Limitations Info  Hearing   Hearing Info: Impaired      SPECIAL CARE FACTORS FREQUENCY  PT (By licensed PT)     PT Frequency: 5x/week              Contractures      Additional Factors Info  Code Status, Allergies, Isolation Precautions Code Status Info: Full Allergies Info: NKA     Isolation Precautions Info: Contact precautions     Current Medications (02/06/2017):  This is the current hospital active medication list Current Facility-Administered Medications  Medication Dose Route Frequency Provider Last Rate Last Dose  . 0.9 %  sodium chloride infusion   Intravenous Continuous Newt Minion, MD 10 mL/hr at 02/05/17 1756    . acetaminophen (TYLENOL) tablet 650 mg  650 mg Oral Q6H PRN Newt Minion, MD       Or  . acetaminophen (TYLENOL) suppository 650 mg  650 mg Rectal Q6H PRN Newt Minion, MD      . aspirin EC tablet 81 mg  81 mg Oral Daily Norval Morton, MD  81 mg at 02/05/17 1332  . bisacodyl (DULCOLAX) suppository 10 mg  10 mg Rectal Daily PRN Newt Minion, MD      . carbidopa-levodopa (SINEMET IR) 25-100 MG per tablet immediate release 1 tablet  1 tablet Oral TID WC Norval Morton, MD   1 tablet at 02/05/17 1808  . cefTRIAXone (ROCEPHIN) 2 g in dextrose 5 % 50 mL IVPB  2 g Intravenous Q24H Dara Hoyer, [facility]   Stopped at 02/05/17 1839  . Chlorhexidine Gluconate Cloth 2 % PADS 6 each  6 each Topical Daily Charlynne Cousins, MD   6 each at 02/05/17 1507  . divalproex (DEPAKOTE SPRINKLE) capsule 125 mg  125 mg Oral BID  Fuller Plan A, MD   125 mg at 02/05/17 2347  . docusate sodium (COLACE) capsule 100 mg  100 mg Oral BID Newt Minion, MD   100 mg at 02/05/17 2348  . enoxaparin (LOVENOX) injection 60 mg  60 mg Subcutaneous QHS Smith, Rondell A, MD   60 mg at 02/05/17 2345  . entacapone (COMTAN) tablet 200 mg  200 mg Oral TID WC Smith, Rondell A, MD   200 mg at 02/05/17 1807  . fentaNYL (SUBLIMAZE) injection 25-50 mcg  25-50 mcg Intravenous Q5 min PRN Suzette Battiest, MD      . guaiFENesin [facility]) 12 hr tablet 600 mg  600 mg Oral BID Fuller Plan A, MD   600 mg at 02/05/17 2346  . HYDROmorphone (DILAUDID) injection 1 mg  1 mg Intravenous Q2H PRN Newt Minion, MD      . hydrOXYzine (ATARAX/VISTARIL) tablet 25 mg  25 mg Oral TID Fuller Plan A, MD   25 mg at 02/05/17 2348  . insulin aspart (novoLOG) injection 0-20 Units  0-20 Units Subcutaneous TID WC Fuller Plan A, MD   20 Units at 02/05/17 1820  . insulin detemir (LEVEMIR) injection 45 Units  45 Units Subcutaneous QHS Norval Morton, MD   45 Units at 02/05/17 2355  . insulin detemir (LEVEMIR) injection 54 Units  54 Units Subcutaneous Daily Smith, Rondell A, MD      . loratadine (CLARITIN) tablet 10 mg  10 mg Oral BID Fuller Plan A, MD   10 mg at 02/05/17 2347  . magnesium citrate solution 1 Bottle  1 Bottle Oral Once PRN Newt Minion, MD      . methocarbamol (ROBAXIN) tablet 500 mg  500 mg Oral Q6H PRN Newt Minion, MD   500 mg at 02/06/17 0221   Or  . methocarbamol (ROBAXIN) 500 mg in dextrose 5 % 50 mL IVPB  500 mg Intravenous Q6H PRN Newt Minion, MD      . metoCLOPramide (REGLAN) tablet 5-10 mg  5-10 mg Oral Q8H PRN Newt Minion, MD       Or  . metoCLOPramide (REGLAN) injection 5-10 mg  5-10 mg Intravenous Q8H PRN Newt Minion, MD      . mupirocin ointment (BACTROBAN) 2 % 1 application  1 application Nasal BID Charlynne Cousins, MD   1 application at 29/47/65 0021  . ondansetron (ZOFRAN) tablet 4 mg  4 mg Oral Q6H PRN  Newt Minion, MD       Or  . ondansetron [facility]) injection 4 mg  4 mg Intravenous Q6H PRN Newt Minion, MD      . oxyCODONE (Oxy IR/ROXICODONE) immediate release tablet 5-10 mg  5-10 mg Oral Q3H PRN Newt Minion, MD  5 mg at 02/06/17 0221  . polyethylene glycol (MIRALAX / GLYCOLAX) packet 17 g  17 g Oral Daily PRN Newt Minion, MD      . potassium chloride SA (K-DUR,KLOR-CON) CR tablet 40 mEq  40 mEq Oral Daily Rosita Fire, MD      . [place]   Oral PRN Charlynne Cousins, MD      . simvastatin (ZOCOR) tablet 40 mg  40 mg Oral QHS Fuller Plan A, MD   40 mg at 02/05/17 2348  . sodium hypochlorite (DAKIN'S 1/4 STRENGTH) topical solution   Irrigation BID Charlynne Cousins, MD      . spironolactone (ALDACTONE) tablet 25 mg  25 mg Oral Daily Tamala Julian, Rondell A, MD   25 mg at 02/05/17 1328  . traZODone (DESYREL) tablet 50 mg  50 mg Oral QHS Smith, Rondell A, MD   50 mg at 02/05/17 2345  . vancomycin (VANCOCIN) 1,750 mg in sodium chloride 0.9 % 500 mL IVPB  1,750 mg Intravenous Q48H Dara Hoyer, RPH   Stopped at 02/06/17 0139   Facility-Administered Medications Ordered in Other Encounters  Medication Dose Route Frequency Provider Last Rate Last Dose  . chlorhexidine (HIBICLENS) 4 % liquid 4 application  60 mL Topical Once Gaynelle Arabian, MD         Discharge Medications: Please see discharge summary for a list of discharge medications.  Relevant Imaging Results:  Relevant Lab Results:   Additional Information SSN: [place], [place]

## 2017-02-06 NOTE — Progress Notes (Signed)
NT notified RN that patient's left lower arm had sustained a skin tear during a bath. Wound assessed by RN, cleansed and skin repositioned over wound and tegaderm dressing applied.  P.J. Linus Mako, RN

## 2017-02-06 NOTE — Consult Note (Signed)
[facility]    Date of Admission:  02/03/2017           Day 3 vancomycin        Day 3 ceftriaxone       Reason for Consult: diabetic foot infection    Referring Provider: Dr. Lawson Radar  Assessment: I do not think that he is going to be a long postop course of antibiotics. He probably has some mild, resil cellulitis around his stump. I will continue vancomycin at least overnight. I will stop ceftriaxone now. I do not believe that he has pneumonia.   Plan: 1. Continue vancomycin overnight 2. Discontinue ceftriaxone   Principal Problem:   Subacute osteomyelitis, right ankle and foot (HCC) Active Problems:   Type 2 diabetes mellitus with foot ulcer (HCC)   Cellulitis of right lower extremity   S/P BKA (below knee amputation) unilateral, right (HCC)   Hyponatremia   Normocytic anemia   COPD-stage 2   Chronic kidney disease (CKD), stage III (moderate)   Sepsis (HCC)   Hypokalemia   AKI (acute kidney injury) ([place])   . aspirin EC  81 mg Oral Daily  . carbidopa-levodopa  1 tablet Oral TID WC  . Chlorhexidine Gluconate Cloth  6 each Topical Daily  . divalproex  125 mg Oral BID  . docusate sodium  100 mg Oral BID  . enoxaparin (LOVENOX) injection  60 mg Subcutaneous QHS  . entacapone  200 mg Oral TID WC  . guaiFENesin  600 mg Oral BID  . hydrOXYzine  25 mg Oral TID  . insulin aspart  0-20 Units Subcutaneous TID WC  . insulin detemir  45 Units Subcutaneous QHS  . insulin detemir  54 Units Subcutaneous Daily  . loratadine  10 mg Oral BID  . mupirocin ointment  1 application Nasal BID  . potassium chloride SA  40 mEq Oral BID  . simvastatin  40 mg Oral QHS  . sodium hypochlorite   Irrigation BID  . spironolactone  25 mg Oral Daily  . traZODone  50 mg Oral QHS    HPI: Preston Carter is a 83 y.o. male with diabetes, peripheral neuropathy peripheral artery disease and venous insufficiency who has had a chronic ulcer on the plantar surface  of his right foot for the last 3-1/2 years. He has been followed at the local [place]. His wife tells me that he had a swab culture of ulcer drainage recently that grew MRSA. He was started on oral clindamycin but began having fevers, increased drainage, swelling and redness up to the level of his knee. He was admitted here on 02/03/2017. MRI revealed osteomyelitis and cuboid, distal calcaneus and fourth and fifth metatarsals. He was started on vancomycin, ceftriaxone and azithromycin on admission for osteomyelitis, cellulitis of his right leg and possible pneumonia. He underwent transtibial amputation yesterday. I was asked to assist with antibiotic management.   Review of Systems: Review of Systems  Unable to perform ROS: Mental acuity  Constitutional:       He defers to his wife to answer questions.    Past Medical History:  Diagnosis Date  . Anemia 05/14/2011  . Arthritis 05-03-11   most joints- Bil. hips/knees replaced in the past  . Asthma   . DEMENTIA 05-03-11   hx. Alzheimers-tx. Aricept  . Diabetes mellitus 05-03-11   Oral meds used-Dabetes x5 yrs-CBG's (145)  . Hypertension   . Irregular heart beat   .  Neuromuscular disorder ([place]) 05-03-11   Dx. Parkinson's -essential tremors-generally, ambulates with walker  . Shortness of breath 05-03-11   SOB with exertion only  . Skin cancer 05-03-11   Past hx. skin cancer lesions-removed  . Stroke [facility]) 05-03-11   TIA- more than 20 yrs ago.    Social History  Substance Use Topics  . Smoking status: Former Smoker    Packs/day: 1.50    Years: 20.00    Quit date: 06/20/1977  . Smokeless tobacco: Never Used  . Alcohol use Not on file    Family History  Problem Relation Age of Onset  . Heart disease Father    No Known Allergies  OBJECTIVE: Blood pressure 138/66, pulse (!) 117, temperature 99.5 F (37.5 C), temperature source Oral, resp. rate (!) 28, height 5\' 11"  (1.803 m), weight 258 lb 14.4 oz (117.4 kg), SpO2 100  %.  Physical Exam  Constitutional:  He is sitting up in a chair being fed lunch.  Cardiovascular: Normal rate and regular rhythm.   No murmur heard. Pulmonary/Chest: Effort normal and breath sounds normal. He has no wheezes. He has no rales.  Abdominal: Soft. There is no tenderness.  Musculoskeletal:  He has a right below-the-knee amputation. There is a VAC wound dressing in place. His BKA stump is warm.He has an Haematologist on his left lower leg.  Neurological: He is alert.  Skin: No rash noted.  Scattered ecchymoses on his arms.    Lab Results Lab Results  Component Value Date   WBC 21.0 (H) 02/06/2017   HGB 8.9 (L) 02/06/2017   HCT 26.3 (L) 02/06/2017   MCV 79.7 02/06/2017   PLT 235 02/06/2017    Lab Results  Component Value Date   CREATININE 1.43 (H) 02/06/2017   BUN 34 (H) 02/06/2017   NA 131 (L) 02/06/2017   K 3.3 (L) 02/06/2017   CL 93 (L) 02/06/2017   CO2 29 02/06/2017    Lab Results  Component Value Date   ALT 14 (L) 02/04/2017   AST 77 (H) 02/04/2017   ALKPHOS 154 (H) 02/04/2017   BILITOT 1.0 02/04/2017     Microbiology: Recent Results (from the past 240 hour(s))  Blood Culture (routine x 2)     Status: None (Preliminary result)   Collection Time: 02/03/17  6:15 PM  Result Value Ref Range Status   Specimen Description BLOOD RIGHT ANTECUBITAL  Final   Special Requests   Final    BOTTLES DRAWN AEROBIC AND ANAEROBIC Blood Culture adequate volume   Culture   Final    NO GROWTH 3 DAYS Performed at [facility], [place] [address], [place], [place] [address]    Report Status PENDING  Incomplete  Blood Culture (routine x 2)     Status: None (Preliminary result)   Collection Time: 02/03/17  6:20 PM  Result Value Ref Range Status   Specimen Description BLOOD BLOOD RIGHT FOREARM  Final   Special Requests IN PEDIATRIC BOTTLE Blood Culture adequate volume  Final   Culture   Final    NO GROWTH 3 DAYS Performed at [facility], [place] [address],  [place], [place] [address]    Report Status PENDING  Incomplete  Surgical pcr screen     Status: Abnormal   Collection Time: 02/04/17  8:40 PM  Result Value Ref Range Status   MRSA, PCR NEGATIVE NEGATIVE Final   Staphylococcus aureus POSITIVE (A) NEGATIVE Final    Comment:  The Xpert SA Assay (FDA approved for NASAL specimens in patients over 98 years of age), is one component of a comprehensive surveillance program.  Test performance has been validated by [facility] for patients greater than or equal to 68 year old. It is not intended to diagnose infection nor to guide or monitor treatment.     Michel Bickers, MD [facility] [place] [facility] [phone] pager   [phone] cell 02/06/2017, 2:00 PM

## 2017-02-06 NOTE — Progress Notes (Signed)
Patient ID: Preston Carter, male   DOB: 1932-06-08, 83 y.o.   MRN: [id number] Postoperative day 1 transtibial amputation on the right. Patient is resting comfortably this morning. There is 125 mL and the wound VAC canister. Hemoglobin has dropped from 10.6-8.9. Patient will need discharge to skilled nursing.

## 2017-02-07 ENCOUNTER — Ambulatory Visit: Payer: Medicare Other | Admitting: Sports Medicine

## 2017-02-07 LAB — GLUCOSE, CAPILLARY
GLUCOSE-CAPILLARY: 205 mg/dL — AB (ref 65–99)
GLUCOSE-CAPILLARY: 318 mg/dL — AB (ref 65–99)
Glucose-Capillary: 165 mg/dL — ABNORMAL HIGH (ref 65–99)
Glucose-Capillary: 165 mg/dL — ABNORMAL HIGH (ref 65–99)
Glucose-Capillary: 243 mg/dL — ABNORMAL HIGH (ref 65–99)

## 2017-02-07 LAB — BASIC METABOLIC PANEL
ANION GAP: 7 (ref 5–15)
BUN: 27 mg/dL — ABNORMAL HIGH (ref 6–20)
CALCIUM: 8 mg/dL — AB (ref 8.9–10.3)
CO2: 30 mmol/L (ref 22–32)
Chloride: 96 mmol/L — ABNORMAL LOW (ref 101–111)
Creatinine, Ser: 1.23 mg/dL (ref 0.61–1.24)
GFR calc non Af Amer: 52 mL/min — ABNORMAL LOW (ref >60)
Glucose, Bld: 214 mg/dL — ABNORMAL HIGH (ref 65–99)
Potassium: 4 mmol/L (ref 3.5–5.1)
SODIUM: 133 mmol/L — AB (ref 135–145)

## 2017-02-07 LAB — CBC
HCT: 24.9 % — ABNORMAL LOW (ref 39.0–52.0)
Hemoglobin: 8.2 g/dL — ABNORMAL LOW (ref 13.0–17.0)
MCH: 26.5 pg (ref 26.0–34.0)
MCHC: 32.9 g/dL (ref 30.0–36.0)
MCV: 80.3 fL (ref 78.0–100.0)
PLATELETS: 257 10*3/uL (ref 150–400)
RBC: 3.1 MIL/uL — ABNORMAL LOW (ref 4.22–5.81)
RDW: 17 % — AB (ref 11.5–15.5)
WBC: 19.7 10*3/uL — AB (ref 4.0–10.5)

## 2017-02-07 MED ORDER — POTASSIUM CHLORIDE CRYS ER 20 MEQ PO TBCR
20.0000 meq | EXTENDED_RELEASE_TABLET | Freq: Every day | ORAL | Status: DC
  Administered 2017-02-08: 20 meq via ORAL
  Filled 2017-02-07: qty 1

## 2017-02-07 NOTE — Progress Notes (Signed)
02/07/2017- Pt declined CPAP 

## 2017-02-07 NOTE — Procedures (Signed)
Objective Swallowing Evaluation: Type of Study: FEES-Fiberoptic Endoscopic Evaluation of Swallow  Patient Details  Name: Preston Carter MRN: [id number] Date of Birth: 1931/07/11  Today's Date: 02/07/2017 Time: SLP Start Time (ACUTE ONLY): 1336-SLP Stop Time (ACUTE ONLY): 1408 SLP Time Calculation (min) (ACUTE ONLY): 32 min  Past Medical History:  Past Medical History:  Diagnosis Date  . Anemia 05/14/2011  . Arthritis 05-03-11   most joints- Bil. hips/knees replaced in the past  . Asthma   . DEMENTIA 05-03-11   hx. Alzheimers-tx. Aricept  . Diabetes mellitus 05-03-11   Oral meds used-Dabetes x5 yrs-CBG's (145)  . Hypertension   . Irregular heart beat   . Neuromuscular disorder ([place]) 05-03-11   Dx. Parkinson's -essential tremors-generally, ambulates with walker  . Shortness of breath 05-03-11   SOB with exertion only  . Skin cancer 05-03-11   Past hx. skin cancer lesions-removed  . Stroke [facility]) 05-03-11   TIA- more than 20 yrs ago.   Past Surgical History:  Past Surgical History:  Procedure Laterality Date  . AMPUTATION Right 02/05/2017   Procedure: AMPUTATION BELOW KNEE;  Surgeon: Newt Minion, MD;  Location: [place];  Service: Orthopedics;  Laterality: Right;  . CARPAL TUNNEL RELEASE  05-03-11   bilateral  . CHOLECYSTECTOMY    . EYE SURGERY  05-03-11   lt. eye for lazy eye  . JOINT REPLACEMENT  05-03-11   Bil. hip/knee repalcements-now RT. Hip to be revised 05-11-11  . LOOP RECORDER IMPLANT N/A 12/05/2012   Procedure: LOOP RECORDER IMPLANT;  Surgeon: Deboraha Sprang, MD;  Location: [facility] CATH LAB;  Service: Cardiovascular;  Laterality: N/A;  . TONSILLECTOMY    . TOTAL HIP REVISION  05/11/2011   Procedure: TOTAL HIP REVISION;  Surgeon: Dione Plover Aluisio;  Location: [facility];  Service: Orthopedics;  Laterality: Right;  . UVULOPALATOPHARYNGOPLASTY  05-03-11   x2 . Can.t tolerate cpap machine, doesn't use   HPI: 83 y.o. male with medical history significant of Parkinson's disease,  dementia, HTN, DM type II, H/O MRSA infections, and TIA/stroke; who presents with complaints of fever off and on for the last two weeks.  History is obtained from the patient's wife was present at bedside. Today fever got up as high as 101.64F.He has been dealing with right foot ulcer for quite some time. Patient had recently completed a 10 day course of clindamycin for right foot ulcer given by Dr. Cannon Kettle of podiatry. Dr. Cannon Kettle had also scheduled him ABI testing on the 24th of this month. He had also recently checked a MRI which showed signs of osteomyelitis. He has been having wound care come out twice a week for some unknown time period to wrap his legs and monitor the foot ulcer that has been present for 3 years. Wife states that it has been chronically draining, although the odor may have worsened recently. Patient has a chronic cough that is productive, but has been worse in the last 2 days. Associated symptoms include decreased appetite, worsened tremor, generalized weakness, and 2 falls in last week  Subjective: pt HOH, denies any trouble swallowing   Assessment / Plan / Recommendation  CHL IP CLINICAL IMPRESSIONS 02/07/2017  Clinical Impression Pt has a mild-moderate pharyngeal dysphagia due in large part to motor deficits. He has generalized pharyngeal weakness resulting in diffuse pharyngeal residue that is more mild with liquids but increases in amount as boluses become more solid. Cued second swallows and liquid washes facilitate pharyngeal clearance. Pt has small amounts of penetration  with thin liquids that is mostly silent, although he did appear to have one episode of penetration that touched the true vocal cords, triggering a cough response, although visualization was somewhat limited at that moment. Regardless, he had improved airway protection with all other consistencies tested. Recommend continuing current diet with the addition of extra dry swallows and alternating bites/sips to  increase safety. SLP also reviewed aspiration precautions, as pt was initially found being fed by his family while lying very reclined in bed. Pt would be a good candidate for EMST to facilitate cough and swallow strength, particularly for a pt with Parkinson's disease.  SLP Visit Diagnosis Dysphagia, oropharyngeal phase (R13.12)  Attention and concentration deficit following --  Frontal lobe and executive function deficit following --  Impact on safety and function Mild aspiration risk;Moderate aspiration risk      CHL IP TREATMENT RECOMMENDATION 02/07/2017  Treatment Recommendations Therapy as outlined in treatment plan below     Prognosis 02/07/2017  Prognosis for Safe Diet Advancement Good  Barriers to Reach Goals Cognitive deficits  Barriers/Prognosis Comment --    CHL IP DIET RECOMMENDATION 02/07/2017  SLP Diet Recommendations Dysphagia 3 (Mech soft) solids;Nectar thick liquid  Liquid Administration via Cup;Straw  Medication Administration Whole meds with puree  Compensations Slow rate;Small sips/bites;Multiple dry swallows after each bite/sip;Follow solids with liquid;Clear throat intermittently  Postural Changes Remain semi-upright after after feeds/meals (Comment);Seated upright at 90 degrees      CHL IP OTHER RECOMMENDATIONS 02/07/2017  Recommended Consults --  Oral Care Recommendations Oral care BID  Other Recommendations --      CHL IP FOLLOW UP RECOMMENDATIONS 02/07/2017  Follow up Recommendations Skilled Nursing facility      [facility] IP FREQUENCY AND DURATION 02/07/2017  Speech Therapy Frequency (ACUTE ONLY) min 2x/week  Treatment Duration 2 weeks           CHL IP ORAL PHASE 02/07/2017  Oral Phase Impaired  Oral - Pudding Teaspoon --  Oral - Pudding Cup --  Oral - Honey Teaspoon --  Oral - Honey Cup --  Oral - Nectar Teaspoon Delayed oral transit  Oral - Nectar Cup Delayed oral transit  Oral - Nectar Straw Delayed oral transit  Oral - Thin Teaspoon Delayed oral  transit  Oral - Thin Cup Delayed oral transit  Oral - Thin Straw --  Oral - Puree Delayed oral transit  Oral - Mech Soft Impaired mastication;Delayed oral transit  Oral - Regular --  Oral - Multi-Consistency --  Oral - Pill --  Oral Phase - Comment --    CHL IP PHARYNGEAL PHASE 02/07/2017  Pharyngeal Phase Impaired  Pharyngeal- Pudding Teaspoon --  Pharyngeal --  Pharyngeal- Pudding Cup --  Pharyngeal --  Pharyngeal- Honey Teaspoon --  Pharyngeal --  Pharyngeal- Honey Cup --  Pharyngeal --  Pharyngeal- Nectar Teaspoon Delayed swallow initiation-vallecula;Reduced pharyngeal peristalsis;Reduced anterior laryngeal mobility;Reduced laryngeal elevation;Reduced tongue base retraction;Pharyngeal residue - valleculae;Pharyngeal residue - pyriform;Pharyngeal residue - posterior pharnyx;Lateral channel residue  Pharyngeal --  Pharyngeal- Nectar Cup Delayed swallow initiation-vallecula;Reduced pharyngeal peristalsis;Reduced anterior laryngeal mobility;Reduced laryngeal elevation;Reduced tongue base retraction;Pharyngeal residue - valleculae;Pharyngeal residue - pyriform;Pharyngeal residue - posterior pharnyx;Lateral channel residue  Pharyngeal --  Pharyngeal- Nectar Straw Delayed swallow initiation-vallecula;Reduced pharyngeal peristalsis;Reduced anterior laryngeal mobility;Reduced laryngeal elevation;Reduced tongue base retraction;Pharyngeal residue - valleculae;Pharyngeal residue - pyriform;Pharyngeal residue - posterior pharnyx;Lateral channel residue  Pharyngeal --  Pharyngeal- Thin Teaspoon Delayed swallow initiation-vallecula;Reduced pharyngeal peristalsis;Reduced anterior laryngeal mobility;Reduced laryngeal elevation;Reduced tongue base retraction;Pharyngeal residue - valleculae;Pharyngeal residue -  pyriform;Pharyngeal residue - posterior pharnyx;Lateral channel residue  Pharyngeal --  Pharyngeal- Thin Cup Delayed swallow initiation-vallecula;Reduced pharyngeal peristalsis;Reduced anterior  laryngeal mobility;Reduced laryngeal elevation;Reduced tongue base retraction;Pharyngeal residue - valleculae;Pharyngeal residue - pyriform;Pharyngeal residue - posterior pharnyx;Lateral channel residue;Inter-arytenoid space residue;Penetration/Aspiration during swallow  Pharyngeal Material enters airway, remains ABOVE vocal cords and not ejected out  Pharyngeal- Thin Straw --  Pharyngeal --  Pharyngeal- Puree Delayed swallow initiation-vallecula;Reduced pharyngeal peristalsis;Reduced anterior laryngeal mobility;Reduced laryngeal elevation;Reduced tongue base retraction;Pharyngeal residue - valleculae;Pharyngeal residue - pyriform;Pharyngeal residue - posterior pharnyx;Lateral channel residue  Pharyngeal --  Pharyngeal- Mechanical Soft Delayed swallow initiation-vallecula;Reduced pharyngeal peristalsis;Reduced anterior laryngeal mobility;Reduced laryngeal elevation;Reduced tongue base retraction;Pharyngeal residue - valleculae;Pharyngeal residue - pyriform;Pharyngeal residue - posterior pharnyx;Lateral channel residue  Pharyngeal --  Pharyngeal- Regular --  Pharyngeal --  Pharyngeal- Multi-consistency --  Pharyngeal --  Pharyngeal- Pill --  Pharyngeal --  Pharyngeal Comment --     CHL IP CERVICAL ESOPHAGEAL PHASE 02/07/2017  Cervical Esophageal Phase WFL  Pudding Teaspoon --  Pudding Cup --  Honey Teaspoon --  Honey Cup --  Nectar Teaspoon --  Nectar Cup --  Nectar Straw --  Thin Teaspoon --  Thin Cup --  Thin Straw --  Puree --  Mechanical Soft --  Regular --  Multi-consistency --  Pill --  Cervical Esophageal Comment --    No flowsheet data found.  Germain Osgood 02/07/2017, 3:29 PM   Germain Osgood, M.A. CCC-SLP [phone]

## 2017-02-07 NOTE — Progress Notes (Signed)
Patient ID: Preston Carter, male   DOB: 1931-06-26, 83 y.o.   MRN: [id number]          [facility]  Date of Admission:  02/03/2017           Day 4 vancomycin ASSESSMENT: His right foot osteomyelitis was cured with his amputation and his right lower leg cellulitis is resolving. He can stop vancomycin tomorrow or when he is transferred to a skilled nursing facility, whichever occurs first.  PLAN: 1. Stop vancomycin soon  Principal Problem:   Subacute osteomyelitis, right ankle and foot (HCC) Active Problems:   Type 2 diabetes mellitus with foot ulcer (HCC)   Cellulitis of right lower extremity   S/P BKA (below knee amputation) unilateral, right (HCC)   Hyponatremia   Normocytic anemia   COPD-stage 2   Chronic kidney disease (CKD), stage III (moderate)   Sepsis (HCC)   Hypokalemia   AKI (acute kidney injury) ([place])   . aspirin EC  81 mg Oral Daily  . carbidopa-levodopa  1 tablet Oral TID WC  . Chlorhexidine Gluconate Cloth  6 each Topical Daily  . divalproex  125 mg Oral BID  . docusate sodium  100 mg Oral BID  . enoxaparin (LOVENOX) injection  60 mg Subcutaneous QHS  . entacapone  200 mg Oral TID WC  . guaiFENesin  600 mg Oral BID  . hydrOXYzine  25 mg Oral TID  . insulin aspart  0-20 Units Subcutaneous TID WC  . insulin detemir  45 Units Subcutaneous QHS  . insulin detemir  54 Units Subcutaneous Daily  . loratadine  10 mg Oral BID  . mupirocin ointment  1 application Nasal BID  . [START ON 02/08/2017] potassium chloride SA  20 mEq Oral Daily  . simvastatin  40 mg Oral QHS  . sodium hypochlorite   Irrigation BID  . spironolactone  25 mg Oral Daily  . traZODone  50 mg Oral QHS    SUBJECTIVE: He is having right leg pain.  Review of Systems: Review of Systems  Constitutional: Negative for chills, diaphoresis and fever.  Gastrointestinal: Negative for abdominal pain, diarrhea, nausea and vomiting.  Musculoskeletal: Positive for joint pain.      No Known Allergies  OBJECTIVE: Vitals:   02/06/17 0900 02/06/17 1338 02/06/17 2150 02/07/17 0459  BP:  (!) 141/78 (!) 158/51 (!) 118/56  Pulse:  (!) 117 97 (!) 108  Resp:   18 18  Temp:  99.5 F (37.5 C) 98.4 F (36.9 C) 99.2 F (37.3 C)  TempSrc:  Oral Oral   SpO2:  100% 100% 100%  Weight: 258 lb 14.4 oz (117.4 kg)   294 lb 4.8 oz (133.5 kg)  Height:       Body mass index is 41.05 kg/m.  Physical Exam  Constitutional:  He is resting quietly in bed. His wife is at the bedside.  Musculoskeletal:  He has a VAC wound dressing on his right BKA stump. His right leg is no warmer than his left leg.  Neurological: He is alert.  Psychiatric: Mood and affect normal.    Lab Results Lab Results  Component Value Date   WBC 19.7 (H) 02/07/2017   HGB 8.2 (L) 02/07/2017   HCT 24.9 (L) 02/07/2017   MCV 80.3 02/07/2017   PLT 257 02/07/2017    Lab Results  Component Value Date   CREATININE 1.23 02/07/2017   BUN 27 (H) 02/07/2017   NA 133 (L) 02/07/2017   K  4.0 02/07/2017   CL 96 (L) 02/07/2017   CO2 30 02/07/2017    Lab Results  Component Value Date   ALT 14 (L) 02/04/2017   AST 77 (H) 02/04/2017   ALKPHOS 154 (H) 02/04/2017   BILITOT 1.0 02/04/2017     Microbiology: Recent Results (from the past 240 hour(s))  Blood Culture (routine x 2)     Status: None (Preliminary result)   Collection Time: 02/03/17  6:15 PM  Result Value Ref Range Status   Specimen Description BLOOD RIGHT ANTECUBITAL  Final   Special Requests   Final    BOTTLES DRAWN AEROBIC AND ANAEROBIC Blood Culture adequate volume   Culture   Final    NO GROWTH 4 DAYS Performed at [facility], [place] [address], [place], [place] [address]    Report Status PENDING  Incomplete  Blood Culture (routine x 2)     Status: None (Preliminary result)   Collection Time: 02/03/17  6:20 PM  Result Value Ref Range Status   Specimen Description BLOOD BLOOD RIGHT FOREARM  Final   Special Requests IN PEDIATRIC  BOTTLE Blood Culture adequate volume  Final   Culture   Final    NO GROWTH 4 DAYS Performed at [facility], [place] [address], [place], [place] [address]    Report Status PENDING  Incomplete  Surgical pcr screen     Status: Abnormal   Collection Time: 02/04/17  8:40 PM  Result Value Ref Range Status   MRSA, PCR NEGATIVE NEGATIVE Final   Staphylococcus aureus POSITIVE (A) NEGATIVE Final    Comment:        The Xpert SA Assay (FDA approved for NASAL specimens in patients over 41 years of age), is one component of a comprehensive surveillance program.  Test performance has been validated by [facility] for patients greater than or equal to 61 year old. It is not intended to diagnose infection nor to guide or monitor treatment.     Michel Bickers, MD [facility] for Infectious [place] [facility] [phone] pager   [phone] cell 02/07/2017, 2:09 PM

## 2017-02-07 NOTE — Care Management Note (Signed)
Case Management Note  Patient Details  Name: RALPHIE LOVELADY MRN: [id number] Date of Birth: [birth year]  Subjective/Objective:      Subacute osteomyelitis, right ankle and foot, DM, s/p BKA right, CKD              Action/Plan: Discharge Planning: Chart reviewed. CSW following for SNF placement. Will continue to follow for dc needs.   PCP Leanna Battles MD    Expected Discharge Date:              Expected Discharge Plan:  [place]  In-House Referral:  Clinical Social Work  Discharge planning Services  CM Consult  Post Acute Care Choice:  NA Choice offered to:  NA  DME Arranged:  N/A DME Agency:  NA  HH Arranged:  NA HH Agency:  NA  Status of Service:  Completed, signed off  If discussed at [place] of Stay Meetings, dates discussed:    Additional Comments:  Erenest Rasher, RN 02/07/2017, 11:00 AM

## 2017-02-07 NOTE — Progress Notes (Signed)
Foley catheter removed per protocol at [facility]. Pt due to void and asked to call nursing staff when needing to void. Updated oncoming RN.

## 2017-02-07 NOTE — Progress Notes (Signed)
PROGRESS NOTE    Preston Carter  RXV:[id number] DOB: 1931-09-17 DOA: 02/03/2017 PCP: Leanna Battles, MD   Brief Narrative: 83 year old male with history of Parkinson disease, COPD on home oxygen, essential hypertension, type 2 diabetes on insulin presented with fever for about 2 weeks. In the ER patient was found to have temperatures of 101.3. Patient with right foot ulcer and completed 10 days of clindamycin before admission. MRI showed osteomyelitis. Patient was transferred to [facility] from [facility] for orthopedics surgery. Status post transtibial amputation and application of wound VAC by Dr. Sharol Given.  Assessment & Plan:  # Sepsis due to right foot ulcer/with gangrene and subacute osteomyelitis: -Patient failed outpatient antibiotics treatment. Status post transtibial amputation and application of wound VAC by Dr. Sharol Given on August 19. -Continue wound care -ID consult requested. Ceftriaxone was discontinued and currently on IV vancomycin as per ID. WBC count trending down. Follow up ID for antibiotics. Follow culture results. -Management of wound VAC as per Dr. Sharol Given. --Pain management, PT OT evaluation. Patient will go to skilled facility when bed is available. Social worker is following.  #Hyponatremia and hypokalemia: Monitor electrolytes. Potassium level acceptable. Monitor labs.  #Chronic kidney disease is stage III: Serum creatinine level is improving, creatinine of 1.2 today. Monitor BMP. Avoid nephrotoxins  # Acute anemia likely due to acute blood loss during surgery: Hemoglobin mildly tender down. No sign of active bleeding. Continue to monitor CBC.  #Multiple sacral decubitus ulcer stage II: Continue wound care  #OSA: Continue CPAP at night  #Type 2 diabetes on chronic insulin: Patient is now on home dose of Levemir. Continue sliding scale. Monitor blood sugar level.   #Parkinson disease: Continue Sinemet. Palliative care consult follow-up  # COPD with chronic respiratory  failure: Continue nebulization and oxygen treatment.  DVT prophylaxis:lovenox sq Code Status: Full code Family Communication: No family at bedside today. Discussed with multiple family members 2 days ago. Disposition Plan: Likely discharge to skilled facility in 1-2 days, when bed is available.  Consultants:   Orthopedics  Infectious disease  Procedures:transtibial amputation and application of wound VAC by Dr. Sharol Given. Antimicrobials: Vancomycin  Subjective: Seen and examined at bedside. Denied pain, headache, dizziness, chest pain or shortness of breath.  Objective: Vitals:   02/06/17 0900 02/06/17 1338 02/06/17 2150 02/07/17 0459  BP:  (!) 141/78 (!) 158/51 (!) 118/56  Pulse:  (!) 117 97 (!) 108  Resp:   18 18  Temp:  99.5 F (37.5 C) 98.4 F (36.9 C) 99.2 F (37.3 C)  TempSrc:  Oral Oral   SpO2:  100% 100% 100%  Weight: 117.4 kg (258 lb 14.4 oz)   133.5 kg (294 lb 4.8 oz)  Height:        Intake/Output Summary (Last 24 hours) at 02/07/17 1128 Last data filed at 02/07/17 5093  Gross per 24 hour  Intake              240 ml  Output             1425 ml  Net            -1185 ml   Filed Weights   02/05/17 0500 02/06/17 0900 02/07/17 0459  Weight: 135.9 kg (299 lb 8 oz) 117.4 kg (258 lb 14.4 oz) 133.5 kg (294 lb 4.8 oz)    Examination:  General exam: Pleasant elderly male lying in bed comfortable  Respiratory system: Bibasal decreased breath sound, no wheezing or crackle. Cardiovascular system: Regular rate and rhythm,  S1-S2 normal   Gastrointestinal system: Abdomen is  soft and nontender. Normal bowel sounds heard. Central nervous system: Alert Awake and following commands.  Musculoskeletal: Right amputation site with dressing and wound VAC. No sign of bleeding, unchanged physical exam.  Data Reviewed: I have personally reviewed following labs and imaging studies  CBC:  Recent Labs Lab 02/03/17 1816 02/04/17 0749 02/05/17 1007 02/06/17 0338 02/07/17 0543    WBC 21.3* 20.0* 16.6* 21.0* 19.7*  NEUTROABS 17.8*  --   --   --   --   HGB 11.0* 10.9* 10.6* 8.9* 8.2*  HCT 31.2* 31.2* 30.8* 26.3* 24.9*  MCV 79.0 79.6 79.8 79.7 80.3  PLT 206 210 225 235 916   Basic Metabolic Panel:  Recent Labs Lab 02/03/17 1816 02/04/17 0601 02/06/17 0338 02/07/17 0543  NA 129* 131* 131* 133*  K 3.1* 3.6 3.3* 4.0  CL 86* 89* 93* 96*  CO2 30 30 29 30   GLUCOSE 126* 60* 316* 214*  BUN 63* 58* 34* 27*  CREATININE 1.89* 1.67* 1.43* 1.23  CALCIUM 8.8* 8.8* 7.9* 8.0*  MG  --  2.2  --   --    GFR: Estimated Creatinine Clearance: 63.5 mL/min (by C-G formula based on SCr of 1.23 mg/dL). Liver Function Tests:  Recent Labs Lab 02/03/17 1816 02/04/17 0601  AST 70* 77*  ALT 11* 14*  ALKPHOS 159* 154*  BILITOT 0.7 1.0  PROT 7.3 7.1  ALBUMIN 2.6* 2.5*   No results for input(s): LIPASE, AMYLASE in the last 168 hours. No results for input(s): AMMONIA in the last 168 hours. Coagulation Profile:  Recent Labs Lab 02/03/17 2215  INR 1.10   Cardiac Enzymes: No results for input(s): CKTOTAL, CKMB, CKMBINDEX, TROPONINI in the last 168 hours. BNP (last 3 results) No results for input(s): PROBNP in the last 8760 hours. HbA1C: No results for input(s): HGBA1C in the last 72 hours. CBG:  Recent Labs Lab 02/06/17 1200 02/06/17 1800 02/06/17 2113 02/07/17 0026 02/07/17 0832  GLUCAP 341* 381* 370* 318* 165*   Lipid Profile: No results for input(s): CHOL, HDL, LDLCALC, TRIG, CHOLHDL, LDLDIRECT in the last 72 hours. Thyroid Function Tests: No results for input(s): TSH, T4TOTAL, FREET4, T3FREE, THYROIDAB in the last 72 hours. Anemia Panel: No results for input(s): VITAMINB12, FOLATE, FERRITIN, TIBC, IRON, RETICCTPCT in the last 72 hours. Sepsis Labs:  Recent Labs Lab 02/03/17 1831 02/03/17 2215  PROCALCITON  --  2.35  LATICACIDVEN 1.67  --     Recent Results (from the past 240 hour(s))  Blood Culture (routine x 2)     Status: None (Preliminary  result)   Collection Time: 02/03/17  6:15 PM  Result Value Ref Range Status   Specimen Description BLOOD RIGHT ANTECUBITAL  Final   Special Requests   Final    BOTTLES DRAWN AEROBIC AND ANAEROBIC Blood Culture adequate volume   Culture   Final    NO GROWTH 4 DAYS Performed at [facility], [address], [place], [place] [address]    Report Status PENDING  Incomplete  Blood Culture (routine x 2)     Status: None (Preliminary result)   Collection Time: 02/03/17  6:20 PM  Result Value Ref Range Status   Specimen Description BLOOD BLOOD RIGHT FOREARM  Final   Special Requests IN PEDIATRIC BOTTLE Blood Culture adequate volume  Final   Culture   Final    NO GROWTH 4 DAYS Performed at [facility], [place] [address], [place], [place]  [address]    Report Status PENDING  Incomplete  Surgical pcr screen     Status: Abnormal   Collection Time: 02/04/17  8:40 PM  Result Value Ref Range Status   MRSA, PCR NEGATIVE NEGATIVE Final   Staphylococcus aureus POSITIVE (A) NEGATIVE Final    Comment:        The Xpert SA Assay (FDA approved for NASAL specimens in patients over 75 years of age), is one component of a comprehensive surveillance program.  Test performance has been validated by [facility] for patients greater than or equal to 57 year old. It is not intended to diagnose infection nor to guide or monitor treatment.          Radiology Studies: No results found.      Scheduled Meds: . aspirin EC  81 mg Oral Daily  . carbidopa-levodopa  1 tablet Oral TID WC  . Chlorhexidine Gluconate Cloth  6 each Topical Daily  . divalproex  125 mg Oral BID  . docusate sodium  100 mg Oral BID  . enoxaparin (LOVENOX) injection  60 mg Subcutaneous QHS  . entacapone  200 mg Oral TID WC  . guaiFENesin  600 mg Oral BID  . hydrOXYzine  25 mg Oral TID  . insulin aspart  0-20 Units Subcutaneous TID WC  . insulin detemir  45 Units Subcutaneous QHS  . insulin detemir  54 Units  Subcutaneous Daily  . loratadine  10 mg Oral BID  . mupirocin ointment  1 application Nasal BID  . potassium chloride SA  40 mEq Oral BID  . simvastatin  40 mg Oral QHS  . sodium hypochlorite   Irrigation BID  . spironolactone  25 mg Oral Daily  . traZODone  50 mg Oral QHS   Continuous Infusions: . sodium chloride 10 mL/hr at 02/05/17 1756  . methocarbamol (ROBAXIN)  IV    . vancomycin Stopped (02/07/17 1026)     LOS: 4 days    Dron Tanna Furry, MD [facility] Pager [phone]  If 7PM-7AM, please contact night-coverage [email or web address] Password [facility] 02/07/2017, 11:28 AM

## 2017-02-07 NOTE — Progress Notes (Signed)
Pt CBG 370 was high even though he is scheduled to have levemir 45u on call physician OPYD  Was notified gave an order for insulin aspart 10u, order carried out will continue to monitor pt

## 2017-02-08 LAB — CREATININE, SERUM
CREATININE: 1.15 mg/dL (ref 0.61–1.24)
GFR calc Af Amer: >60 mL/min (ref >60)
GFR calc non Af Amer: 57 mL/min — ABNORMAL LOW (ref >60)

## 2017-02-08 LAB — CBC
HEMATOCRIT: 25.3 % — AB (ref 39.0–52.0)
Hemoglobin: 8.6 g/dL — ABNORMAL LOW (ref 13.0–17.0)
MCH: 28.4 pg (ref 26.0–34.0)
MCHC: 34 g/dL (ref 30.0–36.0)
MCV: 83.5 fL (ref 78.0–100.0)
PLATELETS: 270 10*3/uL (ref 150–400)
RBC: 3.03 MIL/uL — ABNORMAL LOW (ref 4.22–5.81)
RDW: 17.8 % — AB (ref 11.5–15.5)
WBC: 16.8 10*3/uL — ABNORMAL HIGH (ref 4.0–10.5)

## 2017-02-08 LAB — CULTURE, BLOOD (ROUTINE X 2)
CULTURE: NO GROWTH
Culture: NO GROWTH
SPECIAL REQUESTS: ADEQUATE
Special Requests: ADEQUATE

## 2017-02-08 LAB — GLUCOSE, CAPILLARY
GLUCOSE-CAPILLARY: 65 mg/dL (ref 65–99)
Glucose-Capillary: 76 mg/dL (ref 65–99)
Glucose-Capillary: 106 mg/dL — ABNORMAL HIGH (ref 65–99)

## 2017-02-08 MED ORDER — TRAMADOL HCL 50 MG PO TABS: 50.0000 mg | ORAL_TABLET | Freq: Three times a day (TID) | ORAL | 0 refills | Status: AC

## 2017-02-08 MED ORDER — RESOURCE THICKENUP CLEAR PO POWD: ORAL | 0 refills | Status: DC

## 2017-02-08 MED ORDER — POLYETHYLENE GLYCOL 3350 17 G PO PACK: 17.0000 g | PACK | Freq: Every day | ORAL | 0 refills | Status: AC

## 2017-02-08 MED ORDER — OXYCODONE HCL 5 MG PO TABS: 5.0000 mg | ORAL_TABLET | Freq: Four times a day (QID) | ORAL | 0 refills | Status: DC | PRN

## 2017-02-08 MED ORDER — HUMALOG 100 UNIT/ML ~~LOC~~ SOLN: SUBCUTANEOUS | 0 refills | Status: AC

## 2017-02-08 NOTE — Consult Note (Addendum)
[facility] Nurse wound follow up Refer to initial [place] consult on 8/18. Wound type: Re-consult requested for buttocks.  Pt has been frequently incontinent and nurses have been unable to keep a condom cath in place since penis size is small.  They have applied a fecal containment device over the penis, which is currently successfully for containment of the urine and skin has become more dry and less macerated then previously reported.  Blisters have evolved into patchy areas of dry scabs.  There are patchy areas of partial thickness skin loss remaining.  Bilat lower buttocks with dark purple bruising of unknown origin. There are NOT pressure injuries, but wounds were noted as present on admission. Dressing procedure/placement/frequency: Pt is preparing to transfer to a SNF today, according to the bedside nurse.  Barrier cream to repel moisture and promote healing. Continue method to contain urine and protect skin. No family members present to discuss plan of care. Please re-consult if further assistance is needed.  Thank-you,  Julien Girt MSN, [place], [place], [place], [place]

## 2017-02-08 NOTE — Discharge Summary (Addendum)
Discharge Summary  Preston Carter NOB:[id number] DOB: Oct 20, 1931  PCP: Leanna Battles, MD  Admit date: 02/03/2017 Discharge date: 02/08/2017  Time spent: >46mins, more than 50% time spent on coordination of care  Recommendations for Outpatient Follow-up:  1. F/u with SNF MD  for hospital discharge follow up, repeat cbc/bmp at follow up 2. Continue wound care/wound vac per orthopedic Dr Sharol Given instruction, follow up with Dr Sharol Given in one week  3. F/u with cardiology and EP for h/o irregular heart beats and diastolic chf 4. Palliative care to follow patient and family at SNF  5. SNF MD to arrange sleep study, he is previously not on cpap, he is on home o2 which is continued at discharge.  Discharge Diagnoses:  Active Hospital Problems   Diagnosis Date Noted  . Subacute osteomyelitis, right ankle and foot ([place])   . S/P BKA (below knee amputation) unilateral, right ([place]) 02/06/2017  . Hypokalemia 02/04/2017  . Type 2 diabetes mellitus with foot ulcer ([place]) 02/04/2017  . AKI (acute kidney injury) ([place]) 02/04/2017  . Cellulitis of right lower extremity   . Sepsis ([place]) 02/03/2017  . Chronic kidney disease (CKD), stage III (moderate) 12/05/2012  . COPD-stage 2 05/18/2011  . Normocytic anemia 05/14/2011  . Hyponatremia 05/14/2011    Resolved Hospital Problems   Diagnosis Date Noted Date Resolved  No resolved problems to display.    Discharge Condition: stable  Diet recommendation: heart healthy/carb modified, aspiration precaution, continue swallow eval prn  Dysphagia 3 (Mech soft) solids;Nectar thick liquid   Liquid Administration via Cup;Straw  Medication Administration Whole meds with puree  Compensations Slow rate;Small sips/bites;Multiple dry swallows after each bite/sip;Follow solids with liquid;Clear throat intermittently  Postural Changes Remain semi-upright after after feeds/meals (Comment);Seated upright at 90 degrees     Filed Weights   02/06/17 0900 02/07/17 0459  02/08/17 0532  Weight: 117.4 kg (258 lb 14.4 oz) 133.5 kg (294 lb 4.8 oz) 135.9 kg (299 lb 9.6 oz)    History of present illness:  PCP: Leanna Battles, MD  Patient coming from: Home  Chief Complaint: Fever  HPI: Preston Carter is a 83 y.o. male with medical history significant of Parkinson's disease, dementia, HTN, DM type II, H/O MRSA infections, and TIA/stroke; who presents with complaints of fever off and on for the last two weeks.  History is obtained from the patient's wife was present at bedside. Today fever got up as high as 101.38F.He has been dealing with right foot ulcer for quite some time. Patient had recently completed a 10 day course of clindamycin for right foot ulcer given by Dr. Cannon Kettle of podiatry. Dr. Cannon Kettle had also scheduled him ABI testing on the 24th of this month. He had also recently checked a MRI which showed signs of osteomyelitis. He has been having wound care come out twice a week for some unknown time period to wrap his legs and monitor the foot ulcer that has been present for 3 years. Wife states that it has been chronically draining, although the odor may have worsened recently. Patient has a chronic cough that is productive, but has been worse in the last 2 days. Associated symptoms include decreased appetite, worsened tremor, generalized weakness, and 2 falls in last week.   ED Course: Upon admission into the emergency department patient was noted to be febrile to 100F, pulse 60 -104, respirations 15-26, and all vital signs are relatively maintained. Patient was given 500 mL of normal saline IV fluids, vancomycin, ceftriaxone, and azithromycin for  sepsis thought to possibly be related to a right diabetic foot ulcer and possibly pneumonia.   Hospital Course:  Principal Problem:   Subacute osteomyelitis, right ankle and foot (HCC) Active Problems:   Hyponatremia   Normocytic anemia   COPD-stage 2   Chronic kidney disease (CKD), stage III (moderate)   Sepsis  (HCC)   Hypokalemia   Type 2 diabetes mellitus with foot ulcer ([place])   AKI (acute kidney injury) ([place])   Cellulitis of right lower extremity   S/P BKA (below knee amputation) unilateral, right (HCC)  Sepsis due to right foot ulcer/with gangrene and subacute osteomyelitis: -Patient failed outpatient antibiotics treatment. Status post transtibial amputation and application of wound VAC by Dr. Sharol Given on August 19. --ID consult requested. Ceftriaxone was discontinued , she received IV vancomycin as per ID, last dose on 8/22 per ID. WBC count trending down. Blood culture no growth. -Continue wound careManagement of wound VAC as per Dr. Sharol Given. -SNF placement and follow up with Dr Sharol Given.  Hyponatremia and hypokalemia: Monitor electrolytes.  Improved. Home meds lasix and spironolactone resumed at discharge. continue Monitor labs at SNF  AKI on Chronic kidney disease is stage III: Serum creatinine peaked at 1.89, cr 1.15 at discharge.  Monitor BMP. Avoid nephrotoxins  Acute on chronic anemia likely due to acute blood loss during surgery:  hgb baseline around 10?  No sign of active bleeding. No indication for prbc transfusion  hgb stable for the last two days prior to discharge Continue to monitor CBC.  Multiple sacral skin wounds presented on admission: Continue wound care per wound care" Bilat lower buttocks with dark purple bruising of unknown origin. There are NOT pressure injuries, but wounds were noted as present on admission. Dressing procedure/placement/frequency:  Barrier cream to repel moisture and promote healing. Continue method to contain urine and protect skin." Continue wound care at SNF.  OSA: not previously on cpap, SNF MD to refer patient to have sleep study done, he is on home o2 which is continued.  Type 2 diabetes on chronic insulin: Patient is now on home dose of Levemir. Continue sliding scale. Monitor blood sugar level.  a1c 7.9  (01/2017)  Parkinson disease:  Continue home meds Stalevo. Palliative care consult follow-up  COPD with chronic respiratory failure on home o2 at 2liters: Continue nebulization and oxygen treatment.   diastolic chf, continue home meds lasix and spironolactone and follow up with cardiology Dr Terrence Dupont   H/o sinus bradycardia, s/p implantable loop recorder, followed by EP Dr Virl Axe.   Morbid obesity: Body mass index is 41.79 kg/m.    DVT prophylaxis while in the hospital:lovenox sq Code Status: DNR, verified with wife Family Communication:  Discussed with multiple family members at bedside today Disposition Plan: d/c to SNF  Consultants:   Orthopedics  Infectious disease  Procedures:transtibial amputation and application of wound VAC by Dr. Sharol Given. Antimicrobials: Vancomycin    Discharge Exam: BP (!) 111/59 (BP Location: Left Arm)   Pulse 89   Temp 98 F (36.7 C) (Oral)   Resp 18   Ht 5\' 11"  (1.803 m)   Wt 135.9 kg (299 lb 9.6 oz)   SpO2 99%   BMI 41.79 kg/m   General: frail, chronically ill, NAD, very pleasant Cardiovascular: RRR Respiratory: CTBAL Extremity:  Right transtibial amputation with wound vac on, left lower extremity with unna boots on    Discharge Instructions You were cared for by a hospitalist during your hospital stay. If you have any questions about your  discharge medications or the care you received while you were in the hospital after you are discharged, you can call the unit and asked to speak with the hospitalist on call if the hospitalist that took care of you is not available. Once you are discharged, your primary care physician will handle any further medical issues. Please note that NO REFILLS for any discharge medications will be authorized once you are discharged, as it is imperative that you return to your primary care physician (or establish a relationship with a primary care physician if you do not have one) for your aftercare needs so that they can reassess your  need for medications and monitor your lab values.  Discharge Instructions    Diet - low sodium heart healthy    Complete by:  As directed    Carb modified, aspiration precaution:  Dysphagia 3 (Mech soft) solids;Nectar thick liquid  Liquid Administration via Cup;Straw Medication Administration Whole meds with puree Compensations Slow rate;Small sips/bites;Multiple dry swallows after each bite/sip;Follow solids with liquid;Clear throat intermittently Postural Changes Remain semi-upright after after feeds/meals (Comment);Seated upright at 90 degrees   Increase activity slowly    Complete by:  As directed      Allergies as of 02/08/2017   No Known Allergies     Medication List    STOP taking these medications   clindamycin 300 MG capsule Commonly known as:  CLEOCIN     TAKE these medications   albuterol (2.5 MG/3ML) 0.083% nebulizer solution Commonly known as:  PROVENTIL Take 2.5 mg by nebulization every 4 (four) hours as needed. As directed   aspirin EC 81 MG tablet Take 81 mg by mouth daily.   carbidopa-levodopa-entacapone 25-100-200 MG tablet Commonly known as:  STALEVO Take 1 tablet by mouth 3 (three) times daily.   divalproex 125 MG capsule Commonly known as:  DEPAKOTE SPRINKLE Take 125 mg by mouth 2 (two) times daily.   FISH OIL PO Take 2 capsules by mouth daily.   furosemide 40 MG tablet Commonly known as:  LASIX Take 40 mg by mouth 2 (two) times daily.   guaiFENesin-dextromethorphan 100-10 MG/5ML syrup Commonly known as:  ROBITUSSIN DM Take 5 mLs by mouth 3 (three) times daily as needed. cough   HUMALOG 100 UNIT/ML injection Generic drug:  insulin lispro Insulin sliding scale: Blood sugar  120-150   3units                       151-200   4units                       201-250   7units                       251- 300  11units                       301-350   15uints                       351-400   20units                       >400         call MD  immediately What changed:  how much to take  how to take this  when to take this  additional instructions   hydrOXYzine 25 MG tablet Commonly known as:  ATARAX/VISTARIL  Take 25 mg by mouth 3 (three) times daily.   ipratropium 0.02 % nebulizer solution Commonly known as:  ATROVENT Take 0.5 mg by nebulization every 4 (four) hours as needed for wheezing. As directed   LEVEMIR FLEXTOUCH 100 UNIT/ML Pen Generic drug:  Insulin Detemir Inject 45-54 Units into the skin 2 (two) times daily. Inject 54 u in the am and 45 u in the evening   loratadine 10 MG tablet Commonly known as:  CLARITIN Take 10 mg by mouth 2 (two) times daily.   oxyCODONE 5 MG immediate release tablet Commonly known as:  Oxy IR/ROXICODONE Take 1 tablet (5 mg total) by mouth every 6 (six) hours as needed for breakthrough pain.   polyethylene glycol packet Commonly known as:  MIRALAX / GLYCOLAX Take 17 g by mouth daily.   potassium chloride SA 20 MEQ tablet Commonly known as:  K-DUR,KLOR-CON Take 20 mEq by mouth daily. Takes 1 tablets oral every day   RESOURCE THICKENUP CLEAR Powd For nectar thick liquid   simvastatin 40 MG tablet Commonly known as:  ZOCOR Take 40 mg by mouth at bedtime.   spironolactone 25 MG tablet Commonly known as:  ALDACTONE Take 25 mg by mouth daily.   traMADol 50 MG tablet Commonly known as:  ULTRAM Take 1 tablet (50 mg total) by mouth 3 (three) times daily. Pain   traZODone 50 MG tablet Commonly known as:  DESYREL Take 50 mg by mouth at bedtime.            Discharge Care Instructions        Start     Ordered   02/08/17 0000  Increase activity slowly     02/08/17 1346   02/08/17 0000  Diet - low sodium heart healthy     02/08/17 1347   02/08/17 0000  polyethylene glycol (MIRALAX / GLYCOLAX) packet  Daily     02/08/17 1415   02/08/17 0000  Maltodextrin-Xanthan Gum (RESOURCE THICKENUP CLEAR) POWD     02/08/17 1415   02/08/17 0000  oxyCODONE (OXY IR/ROXICODONE)  5 MG immediate release tablet  Every 6 hours PRN     02/08/17 1416   02/08/17 0000  HUMALOG 100 UNIT/ML injection     02/08/17 1418   02/08/17 0000  traMADol (ULTRAM) 50 MG tablet  3 times daily     02/08/17 1449     No Known Allergies  Contact information for follow-up providers    Newt Minion, MD Follow up in 1 week(s).   Specialty:  Orthopedic Surgery Contact information: [place] [address] [phone]        Charolette Forward, MD Follow up in 1 week(s).   Specialty:  Cardiology Why:  for h/o iregular heart beats and diastolic chf Contact information: [address] [place] [address] [phone]        Leanna Battles, MD Follow up.   Specialty:  Internal Medicine Why:  after being discharged from SNF Contact information: [address] [place] [address] [phone]        palliative care to follow patient at SNF Follow up.        Deboraha Sprang, MD Follow up in 1 month(s).   Specialty:  Cardiology Why:  s/p implantable loop recorder, for h/o irregular heart beats Contact information: [address] [address] [phone]            Contact information for after-discharge care  Destination    [facility] .   Specialty:  [place] information: [address] [place] [phone]                   The results of significant diagnostics from this hospitalization (including imaging, microbiology, ancillary and laboratory) are listed below for reference.    Significant Diagnostic Studies: Dg Chest 2 View  Result Date: 02/03/2017 CLINICAL DATA:  83 y/o  M; 2 days of fever. EXAM: CHEST  2 VIEW COMPARISON:  12/04/2012 chest radiograph FINDINGS: Diffuse coarse reticular opacities of the lungs predominantly at the periphery with patchy opacities in the lower lobes best seen on the  lateral radiograph. Mild cardiomegaly. Aortic atherosclerosis with calcification. No pleural effusion or pneumothorax. No acute osseous abnormality is identified. IMPRESSION: Diffuse reticular opacities and patchy bilateral lower lobe opacities may represent pulmonary edema or multifocal pneumonia. No pleural effusion. Aortic atherosclerosis. Mild cardiomegaly. Electronically Signed   By: Kristine Garbe M.D.   On: 02/03/2017 18:12   Dg Foot 2 Views Right  Result Date: 02/03/2017 CLINICAL DATA:  Clinical concern for osteomyelitis.  Fever. EXAM: RIGHT FOOT - 2 VIEW COMPARISON:  Radiographs 10/31/2016, foot MRI 12/01/2016 FINDINGS: Large plantar skin/soft tissue defect. No radiopaque foreign body. Question of increased periosteal reaction about proximal fourth metatarsal. No other is no evidence of bony destructive change. Charcot changes in the midfoot. Radiographic evaluation for osteomyelitis in this setting is limited. Chronic lateral subluxation of the second and third digits with associated soft tissue edema. Marked dorsal soft tissue edema. IMPRESSION: 1. Large plantar skin/soft tissue defect. 2. Questionable periosteal reaction about the proximal fourth metatarsal, increased from prior exam. Osteomyelitis was seen in this area on prior MRI. No additional radiographic findings of osteomyelitis. 3. Chronic Charcot changes in the midfoot, second and third digit subluxation and multifocal soft tissue edema. Electronically Signed   By: Jeb Levering M.D.   On: 02/03/2017 20:02    Microbiology: Recent Results (from the past 240 hour(s))  Blood Culture (routine x 2)     Status: None   Collection Time: 02/03/17  6:15 PM  Result Value Ref Range Status   Specimen Description BLOOD RIGHT ANTECUBITAL  Final   Special Requests   Final    BOTTLES DRAWN AEROBIC AND ANAEROBIC Blood Culture adequate volume   Culture   Final    NO GROWTH 5 DAYS Performed at [facility], [address],  [place], [place] [address]    Report Status 02/08/2017 FINAL  Final  Blood Culture (routine x 2)     Status: None   Collection Time: 02/03/17  6:20 PM  Result Value Ref Range Status   Specimen Description BLOOD BLOOD RIGHT FOREARM  Final   Special Requests IN PEDIATRIC BOTTLE Blood Culture adequate volume  Final   Culture   Final    NO GROWTH 5 DAYS Performed at [facility], [place] [address] [place], [place] [address]    Report Status 02/08/2017 FINAL  Final  Surgical pcr screen     Status: Abnormal   Collection Time: 02/04/17  8:40 PM  Result Value Ref Range Status   MRSA, PCR NEGATIVE NEGATIVE Final   Staphylococcus aureus POSITIVE (A) NEGATIVE Final    Comment:        The Xpert SA Assay (FDA approved for NASAL specimens in patients over 80 years of age), is one component of a comprehensive surveillance program.  Test performance has been validated by [facility] for patients greater than or equal to 61 year old. It is not intended to diagnose infection nor to guide or monitor treatment.      Labs: Basic Metabolic Panel:  Recent Labs Lab 02/03/17 1816 02/04/17 0601 02/06/17 0338 02/07/17 0543 02/08/17 0425  NA 129* 131* 131* 133*  --   K 3.1* 3.6 3.3* 4.0  --   CL 86* 89* 93* 96*  --   CO2 30 30 29 30   --   GLUCOSE 126* 60* 316* 214*  --   BUN 63* 58* 34* 27*  --   CREATININE 1.89* 1.67* 1.43* 1.23 1.15  CALCIUM 8.8* 8.8* 7.9* 8.0*  --   MG  --  2.2  --   --   --    Liver Function Tests:  Recent Labs Lab 02/03/17 1816 02/04/17 0601  AST 70* 77*  ALT 11* 14*  ALKPHOS 159* 154*  BILITOT 0.7 1.0  PROT 7.3 7.1  ALBUMIN 2.6* 2.5*   No results for input(s): LIPASE, AMYLASE in the last 168 hours. No results for input(s): AMMONIA in the last 168 hours. CBC:  Recent Labs Lab 02/03/17 1816 02/04/17 0749 02/05/17 1007 02/06/17 0338 02/07/17 0543 02/08/17 0425  WBC 21.3* 20.0* 16.6* 21.0* 19.7* 16.8*  NEUTROABS 17.8*  --   --   --   --   --   HGB  11.0* 10.9* 10.6* 8.9* 8.2* 8.6*  HCT 31.2* 31.2* 30.8* 26.3* 24.9* 25.3*  MCV 79.0 79.6 79.8 79.7 80.3 83.5  PLT 206 210 225 235 257 270   Cardiac Enzymes: No results for input(s): CKTOTAL, CKMB, CKMBINDEX, TROPONINI in the last 168 hours. BNP: BNP (last 3 results)  Recent Labs  02/04/17 0749  BNP 481.5*    ProBNP (last 3 results) No results for input(s): PROBNP in the last 8760 hours.  CBG:  Recent Labs Lab 02/07/17 1736 02/07/17 2215 02/08/17 0822 02/08/17 0902 02/08/17 1218  GLUCAP 205* 243* 65 76 106*       Signed:  Zasha Belleau MD, PhD  [facility] 02/08/2017, 3:18 PM

## 2017-02-08 NOTE — Progress Notes (Signed)
Hypoglycemic Event  CBG: 65  Treatment: 15 GM carbohydrate snack  Symptoms: None  Follow-up CBG: Time: 0902 CBG Result:76  Possible Reasons for Event: Inadequate meal intake  Comments/MD notified:   Eliot Ford

## 2017-02-08 NOTE — Progress Notes (Signed)
  Speech Language Pathology Treatment: Dysphagia  Patient Details Name: Preston Carter MRN: [id number] DOB: 1932-01-12 Today's Date: 02/08/2017 Time: 8366-2947 SLP Time Calculation (min) (ACUTE ONLY): 27 min  Assessment / Plan / Recommendation Clinical Impression  Treatment focused on assessing pt's respiratory muscle strength to improve cough, vocal cord adduction and airway protection during po's. Manometer used to find pt's lower limit of normal for his age ( 48.11) and MEP (mean expiratory pressure- 38) demonstrating pt's need for treatment. Expiratory trainer set to 20 cm H2O and accurately demonstrated use. Pt had difficulty rating his exertion level (appeared to be moderate level). Instructed pt/family to initiate use performing 2 sets of 10 (or 5 sets of 5) three times a day.    HPI HPI: 83 y.o. male with medical history significant of Parkinson's disease, dementia, HTN, DM type II, H/O MRSA infections, and TIA/stroke; who presents with complaints of fever off and on for the last two weeks.  History is obtained from the patient's wife was present at bedside. Today fever got up as high as 101.56F.He has been dealing with right foot ulcer for quite some time. Patient had recently completed a 10 day course of clindamycin for right foot ulcer given by Dr. Cannon Kettle of podiatry. Dr. Cannon Kettle had also scheduled him ABI testing on the 24th of this month. He had also recently checked a MRI which showed signs of osteomyelitis. He has been having wound care come out twice a week for some unknown time period to wrap his legs and monitor the foot ulcer that has been present for 3 years. Wife states that it has been chronically draining, although the odor may have worsened recently. Patient has a chronic cough that is productive, but has been worse in the last 2 days. Associated symptoms include decreased appetite, worsened tremor, generalized weakness, and 2 falls in last week      SLP Plan  Continue with  current plan of care       Recommendations  Diet recommendations: Nectar-thick liquid;Dysphagia 3 (mechanical soft) Liquids provided via: Cup;Straw Medication Administration: Whole meds with puree Supervision: Staff to assist with self feeding Compensations: Slow rate;Small sips/bites;Multiple dry swallows after each bite/sip;Follow solids with liquid;Clear throat intermittently Postural Changes and/or Swallow Maneuvers: Seated upright 90 degrees                Oral Care Recommendations: Oral care BID Follow up Recommendations: Skilled Nursing facility SLP Visit Diagnosis: Dysphagia, oropharyngeal phase (R13.12) Plan: Continue with current plan of care       GO                Houston Siren 02/08/2017, 2:14 PM   Preston Carter Preston Carter.Ed [employer] [phone]

## 2017-02-08 NOTE — Progress Notes (Signed)
ccmd called that pt had 5beats of  V-TACH, went to pt room to check up on him per pt no chest pain or discomfort [facility] has been notified no new order will continue to monitor pt

## 2017-02-08 NOTE — Progress Notes (Signed)
Patient ID: Preston Carter, male   DOB: 03/26/1932, 83 y.o.   MRN: [id number]          [facility]    Date of Admission:  02/03/2017           Day 5 vancomycin  [name] underwent recent BKA for chronic osteomyelitis of his right foot and ankle complicated by right lower leg cellulitis. He is transferring to a skilled nursing facility today. I will discontinue vancomycin now.         Michel Bickers, MD [facility] [phone] pager   [phone] cell 02/08/2017, 2:01 PM

## 2017-02-08 NOTE — Progress Notes (Signed)
Recommend Palliative Care Referral at SNF for advance care planning and goals of care discussion Please request this on discharge summary.  Lane Hacker, DO Palliative Medicine

## 2017-02-08 NOTE — Progress Notes (Signed)
Pt doesn't have foley in place it taken off by the first shift nurse

## 2017-02-08 NOTE — Progress Notes (Signed)
Patient will DC to: [facility] Anticipated DC date: 02/08/17  Family notified: Spouse Transport by: Corey Harold   Per MD patient ready for DC to [facility]. RN, patient, patient's family, and facility notified of DC. Discharge Summary sent to facility. RN given number for report [phone] Room 223). DC packet on chart. Ambulance transport requested for patient.   CSW signing off.  Cedric Fishman, [place] Social Worker [phone]

## 2017-02-08 NOTE — Progress Notes (Signed)
Patient discharge teaching given, including activity, diet, follow-up appoints, and medications. Patient verbalized understanding of all discharge instructions. IV access was d/c'd. Vitals are stable. Skin is intact except as charted in most recent assessments. Pt to be escorted out by NT, to be driven home by family. 

## 2017-02-08 NOTE — Clinical Social Work Placement (Signed)
   CLINICAL SOCIAL WORK PLACEMENT  NOTE  Date:  02/08/2017  Patient Details  Name: Preston Carter MRN: [id number] Date of Birth: March 13, 1932  Clinical Social Work is seeking post-discharge placement for this patient at the [place] level of care (*CSW will initial, date and re-position this form in  chart as items are completed):  Yes   Patient/family provided with [place] Work Department's list of facilities offering this level of care within the geographic area requested by the patient (or if unable, by the patient's family).  Yes   Patient/family informed of their freedom to choose among providers that offer the needed level of care, that participate in Medicare, Medicaid or managed care program needed by the patient, have an available bed and are willing to accept the patient.  Yes   Patient/family informed of [facility]'s ownership interest in [facility] and [facility], as well as of the fact that they are under no obligation to receive care at these facilities.  PASRR submitted to EDS on       PASRR number received on       Existing PASRR number confirmed on 02/06/17     FL2 transmitted to all facilities in geographic area requested by pt/family on 02/06/17     FL2 transmitted to all facilities within larger geographic area on       Patient informed that his/her managed care company has contracts with or will negotiate with certain facilities, including the following:        Yes   Patient/family informed of bed offers received.  Patient chooses bed at [facility] recommends and patient chooses bed at      Patient to be transferred to [facility] on 02/08/17.  Patient to be transferred to facility by [facility]     Patient family notified on 02/08/17 of transfer.  Name of family member notified:  Spouse     PHYSICIAN       Additional Comment:     _______________________________________________ Benard Halsted, [place] 02/08/2017, 3:11 PM

## 2017-02-08 NOTE — Progress Notes (Signed)
Patient able to discharge to [facility] today. RN aware that wound vac needs to be changed to a [employer].  Percell Locus Kimberle Stanfill LCSWA [phone]

## 2017-02-08 NOTE — Progress Notes (Signed)
Pt buttocks looks awful break down skin and blisters, moisture associated skin damage need a wound care consult, he has been cleanse and barrier applied

## 2017-02-09 ENCOUNTER — Encounter: Payer: Self-pay | Admitting: Internal Medicine

## 2017-02-09 ENCOUNTER — Non-Acute Institutional Stay (SKILLED_NURSING_FACILITY): Payer: Medicare Other | Admitting: Internal Medicine

## 2017-02-09 DIAGNOSIS — E11621 Type 2 diabetes mellitus with foot ulcer: Secondary | ICD-10-CM

## 2017-02-09 DIAGNOSIS — Z794 Long term (current) use of insulin: Secondary | ICD-10-CM | POA: Diagnosis not present

## 2017-02-09 DIAGNOSIS — M86271 Subacute osteomyelitis, right ankle and foot: Secondary | ICD-10-CM | POA: Diagnosis not present

## 2017-02-09 DIAGNOSIS — F0150 Vascular dementia without behavioral disturbance: Secondary | ICD-10-CM | POA: Insufficient documentation

## 2017-02-09 DIAGNOSIS — G4733 Obstructive sleep apnea (adult) (pediatric): Secondary | ICD-10-CM | POA: Diagnosis not present

## 2017-02-09 DIAGNOSIS — L97509 Non-pressure chronic ulcer of other part of unspecified foot with unspecified severity: Secondary | ICD-10-CM

## 2017-02-09 DIAGNOSIS — N179 Acute kidney failure, unspecified: Secondary | ICD-10-CM | POA: Diagnosis not present

## 2017-02-09 DIAGNOSIS — D649 Anemia, unspecified: Secondary | ICD-10-CM | POA: Diagnosis not present

## 2017-02-09 DIAGNOSIS — Z89511 Acquired absence of right leg below knee: Secondary | ICD-10-CM | POA: Diagnosis not present

## 2017-02-09 NOTE — Patient Instructions (Signed)
See assessment and plan under each diagnosis in the problem list and acutely for this visit 

## 2017-02-09 NOTE — Assessment & Plan Note (Addendum)
Discharge summary had suggested repeat sleep study;but the patient has documented intolerance to CPAP and has had a definitive surgical intervention for sleep apnea. With his severe dementia, repeat CPAP trial is not indicated.

## 2017-02-09 NOTE — Assessment & Plan Note (Signed)
Continue wound VAC and wound care at SNF Wean narcotics due to high risk comorbidities of dementia and sleep apnea

## 2017-02-09 NOTE — Assessment & Plan Note (Signed)
Clinically resolved.  

## 2017-02-09 NOTE — Assessment & Plan Note (Signed)
Creatinine serially improved, recheck renal function 02/13/17

## 2017-02-09 NOTE — Assessment & Plan Note (Signed)
An  A1c of 9% or less  is the safest goal for him with multiple advanced co-morbidities. It is critical to prevent hypoglycemia because of his dementia and prior history of TIA/CVA. Recheck A1c in 3-4 months.

## 2017-02-09 NOTE — Assessment & Plan Note (Signed)
Aricept and Namenda would not be clinically appropriate for this patient due to age, polypharmacy & multiple advanced comorbidities

## 2017-02-09 NOTE — Progress Notes (Signed)
NURSING HOME LOCATION:  [facility] ROOM NUMBER:  [id number]  CODE STATUS:  Full Code  PCP:  Leanna Battles, MD  [place] [address]   This is a comprehensive admission note to [facility] performed on this date less than 30 days from date of admission. Included are preadmission medical/surgical history;reconciled medication list; family history; social history and comprehensive review of systems.  Corrections and additions to the records were documented . Comprehensive physical exam was also performed. Additionally a clinical summary was entered for each active diagnosis pertinent to this admission in the Problem List to enhance continuity of care.  HPI: The patient was hospitalized 8/17-8/22/18 for surgical/medical treatment of subacute osteomyelitis of the right ankle and foot with presentation of wound drainage &  intermittent fever for 2 weeks prior to admission. He had had a chronic right foot ulcer for a protracted period of time. Podiatrist Dr. Cannon Kettle had prescribed a ten-day course of clindamycin for the right foot ulcer. MRI as an outpatient had revealed osteomyelitis. Wound care had been providing treatment twice a week in his home. He received fluid resuscitation, vancomycin, ceftriaxone, azithromycin for the sepsis. He underwent BKA of the right lower extremity on 8/20. Osteomyelitis was in the setting of diabetes with peripheral foot ulcer, acute kidney injury, and chronic anemia with baseline hemoglobin of 10. At discharge hemoglobin was essentially stable at 8.6. Course was complicated by cellulitis of the right lower extremity and sepsis syndrome. He has a history of MRSA infection. He had acute kidney injury superimposed on chronic kidney disease. Creatinine peaked at 1.89, it was 1.15 at discharge. At discharge spironolactone was continued. Multiple sacral skin wounds were present at admission and wound care was consulted. Sliding scale coverage  was provided in the hospital. A1c indicated adequate control with a value of 7.9%. Palliative Care was consulted.  Past medical and surgical history:Other significant comorbidities includes history of TIA/stroke, obesity, Parkinson's disease associated with dementia. He has diastolic congestive heart failure and is followed by Dr.Hawani.  He has a history of symptomatic bradycardia and is status post implantable loop recorder. Dysrrhythmia  is followed by Dr. Caryl Comes, cardiology.  He has a history of obstructive sleep apnea but has not been on CPAP. By history he's had an uvulopalatopharyngoplasty. By history he was intolerant to CPAP. Home oxygen was to be continued post discharge for COPD with chronic respiratory failure. Nebulizer treatments were prescribed as needed.  Social history:Quit smoking 1979.   Family history:Noncontributory due to age   Review of systems:Could not be completed due to dementia. SLUMS results were 4 out of 30. PT/OT consulted with his wife by phone. Apparently he has had memory issues for at least 5 years with onset at the diagnosis of Parkinson's. According to wife this has worsened in the last several weeks. At home he had no swallowing problems but as inpatient was placed on chopped, nectar diet. Apparently at home he sleeps in a recliner. At home he was employing an IT trainer wheelchair.  Physical exam:  Pertinent or positive findings: The patient is very somnolent and his speech is slurred. Pattern alopecia is present. He has scattered keratoses especially over the head. He exhibits a subtle tremor of the head. There is edema of the lower lids. He has ptosis greater left than the right. The L nasolabial fold is slightly decreased. Initially slight anisocoria was suggested with the right pupil greater than the left. Heart rhythm and rate are regular with some pauses.  Mild rales are noted without increased work of breathing. Abdomen is massively protuberant. R BKA is  present with the wound VAC draining the wound site. The left lower extremity is weak. The left dorsalis pedis pulses palpable but decreased. With stimulation of the left foot the left toe is upgoing. He has mild erythema at the bases of the toes. There is marked deformity of the left toenails with thickening and discoloration. The left ankle and foot are wrapped. He is marked bruising of the upper extremities, especially on the left.  General appearance:Obese,in  no acute distress ,no increased work of breathing is present.   Lymphatic: No lymphadenopathy about the head, neck, axilla . Eyes: No conjunctival inflammation or lid edema is present. There is no scleral icterus. Ears:  External ear exam shows no significant lesions or deformities.   Nose:  External nasal examination shows no deformity or inflammation. Nasal mucosa are pink and moist without lesions ,exudates Oral exam: lips and gums are healthy appearing.There is no oropharyngeal erythema or exudate . Neck:  No thyromegaly, masses, tenderness noted.    Heart:  No gallop, murmur, click, rub .  Lungs:without wheezes, rhonchi , rubs. Abdomen:Bowel sounds are normal. Abdomen is soft and nontender with no organomegaly, hernias,masses. GU: condom bag in place Extremities:  No cyanosis, clubbing  Neurologic exam : Balance,Rhomberg,finger to nose testing could not be completed due to clinical state Skin: Warm & dry w/o tenting. No significant  rash.  See clinical summary under each active problem in the Problem List with associated updated therapeutic plan

## 2017-02-09 NOTE — Assessment & Plan Note (Addendum)
Recheck CBC on 02/13/17, monitor for any active bleeding Due to post op anemia progression, oral iron for 4-6 weeks

## 2017-02-10 ENCOUNTER — Inpatient Hospital Stay (HOSPITAL_COMMUNITY): Admission: RE | Admit: 2017-02-10 | Payer: Medicare Other | Source: Ambulatory Visit

## 2017-02-13 ENCOUNTER — Ambulatory Visit (INDEPENDENT_AMBULATORY_CARE_PROVIDER_SITE_OTHER): Payer: Medicare Other | Admitting: Family

## 2017-02-13 ENCOUNTER — Telehealth: Payer: Self-pay | Admitting: Internal Medicine

## 2017-02-13 DIAGNOSIS — Z89511 Acquired absence of right leg below knee: Secondary | ICD-10-CM

## 2017-02-13 NOTE — Progress Notes (Signed)
Post-Op Visit Note   Patient: Preston Carter           Date of Birth: 02-23-1932           MRN: [id number] Visit Date: 02/13/2017 PCP: Leanna Battles, MD  Chief Complaint:  Chief Complaint  Patient presents with  . Right Leg - Routine Post Op    HPI:  The patient is an 83 year old gentleman who is 1 week status post right below the knee amputation today completed by his wife and daughter is in a wheelchair. Wound VAC removed.    Ortho Exam Incision well approximated with staples there is moderate bloody drainage no erythema or odor no sign of infection  Visit Diagnoses:  1. S/P BKA (below knee amputation) unilateral, right ([place])     Plan: Follow-up in 2 weeks hope to harvest staples at that time. Begin daily dial soap cleansing. Apply dry dressings. Provided order for his prosthesis to [employer], has used them in the past.   Follow-Up Instructions: Return in about 2 weeks (around 02/27/2017).   Imaging: No results found.  Orders:  No orders of the defined types were placed in this encounter.  No orders of the defined types were placed in this encounter.    PMFS History: Patient Active Problem List   Diagnosis Date Noted  . Vascular dementia 02/09/2017  . S/P BKA (below knee amputation) unilateral, right ([place]) 02/06/2017  . Hypokalemia 02/04/2017  . Type 2 diabetes mellitus with foot ulcer ([place]) 02/04/2017  . AKI (acute kidney injury) ([place]) 02/04/2017  . Subacute osteomyelitis, right ankle and foot ([place])   . Cellulitis of right lower extremity   . Sepsis ([place]) 02/03/2017  . Implantable loop recorder--LINQ 05/28/2013  . Fall 12/05/2012  . Sinoatrial node dysfunction ([place]) 12/05/2012  . Chronic kidney disease (CKD), stage III (moderate) 12/05/2012  . COPD-stage 2 05/18/2011  . Hyponatremia 05/14/2011  . Normocytic anemia 05/14/2011  . EXOGENOUS OBESITY 03/04/2008  . Obstructive sleep apnea 03/04/2008  . Seasonal and perennial allergic rhinitis 03/04/2008  .  Allergic-infective asthma 03/04/2008  . PERIPHERAL EDEMA 03/04/2008   Past Medical History:  Diagnosis Date  . Anemia 05/14/2011  . Arthritis 05-03-11   most joints- Bil. hips/knees replaced in the past  . Asthma   . DEMENTIA 05-03-11   hx. Alzheimers-tx. Aricept  . Diabetes mellitus 05-03-11   Oral meds used-Dabetes x5 yrs-CBG's (145)  . Hypertension   . Irregular heart beat   . Neuromuscular disorder ([place]) 05-03-11   Dx. Parkinson's -essential tremors-generally, ambulates with walker  . Shortness of breath 05-03-11   SOB with exertion only  . Skin cancer 05-03-11   Past hx. skin cancer lesions-removed  . Stroke [facility]) 05-03-11   TIA- more than 20 yrs ago.    Family History  Problem Relation Age of Onset  . Heart disease Father     Past Surgical History:  Procedure Laterality Date  . AMPUTATION Right 02/05/2017   Procedure: AMPUTATION BELOW KNEE;  Surgeon: Newt Minion, MD;  Location: [facility];  Service: Orthopedics;  Laterality: Right;  . CARPAL TUNNEL RELEASE  05-03-11   bilateral  . CHOLECYSTECTOMY    . EYE SURGERY  05-03-11   lt. eye for lazy eye  . JOINT REPLACEMENT  05-03-11   Bil. hip/knee repalcements-now RT. Hip to be revised 05-11-11  . LOOP RECORDER IMPLANT N/A 12/05/2012   Procedure: LOOP RECORDER IMPLANT;  Surgeon: Deboraha Sprang, MD;  Location: [facility] CATH LAB;  Service: Cardiovascular;  Laterality: N/A;  . TONSILLECTOMY    . TOTAL HIP REVISION  05/11/2011   Procedure: TOTAL HIP REVISION;  Surgeon: Dione Plover Aluisio;  Location: [facility];  Service: Orthopedics;  Laterality: Right;  . UVULOPALATOPHARYNGOPLASTY  05-03-11   x2 . Can.t tolerate cpap machine, doesn't use   Social History   Occupational History  . Not on file.   Social History Main Topics  . Smoking status: Former Smoker    Packs/day: 1.50    Years: 20.00    Quit date: 06/20/1977  . Smokeless tobacco: Never Used  . Alcohol use Not on file  . Drug use: Unknown  . Sexual activity: No

## 2017-02-13 NOTE — Telephone Encounter (Signed)
Pt's wife advised pt does need to keep post hospital appointment. Pt's wife advised pt does need to come in for post hospital appointment. Pt's wife asking to reschedule appointment time after 10 AM, pt unable to get to appointment before 10 AM.  I will forward to schedulers to contact her to reschedule appointment.

## 2017-02-13 NOTE — Telephone Encounter (Signed)
New message       Pt was recently discharged from the [facility].  Wife is calling to see if the hosp follow up appt is really necessary.  She states that it is hard to get him to his appts and he has not been seen in a while and wonders if it is necessary to have this appt?  Also, if it is necessary, he will have to resc because he cannot get her at 8.  Please call

## 2017-02-15 ENCOUNTER — Telehealth: Payer: Self-pay | Admitting: Internal Medicine

## 2017-02-15 NOTE — Telephone Encounter (Signed)
New Message     Per wife she does not feel her husband needs a follow up appointment.  If Dr Caryl Comes feels differently about this please have him call her on her cellphone.

## 2017-02-15 NOTE — Telephone Encounter (Signed)
Per Hospital discharge summary (02/08/17)  Recommendations for Outpatient Follow-up:  1. F/u with SNF MD  for hospital discharge follow up, repeat cbc/bmp at follow up 2. Continue wound care/wound vac per orthopedic Dr Sharol Given instruction, follow up with Dr Sharol Given in one week  3. F/u with cardiology and EP for h/o irregular heart beats and diastolic chf 4. Palliative care to follow patient and family at SNF  5. SNF MD to arrange sleep study, he is previously not on cpap, he is on home o2 which is continued at discharge.   Patient recently admitted with BKA.  Will forward to Dr. Caryl Comes to review. No appointments currently scheduled- patient's wife cancelled appointment on 02/17/17 with Chanetta Marshall at [facility] as the time was too early.  Patient last seen by Dr. Caryl Comes on 09/22/14- hx of LINQ implanted for chronic bifasicular block and falls.

## 2017-02-16 NOTE — Telephone Encounter (Signed)
As best as I can see no cardiac issues in discharge summary and recommended followup with Dr Terrence Dupont  We can see if Dr Lyn Records thinks necessary

## 2017-02-17 ENCOUNTER — Ambulatory Visit: Payer: Medicare Other | Admitting: Nurse Practitioner

## 2017-02-17 NOTE — Telephone Encounter (Signed)
Attempted to call Mrs. Nicole Kindred- no answer/ no voice mail.  Will call back.

## 2017-02-22 LAB — CBC AND DIFFERENTIAL
HCT: 34 — AB (ref 41–53)
Hemoglobin: 11.1 — AB (ref 13.5–17.5)
NEUTROS ABS: 5
Platelets: 220 (ref 150–399)
WBC: 9.2

## 2017-02-27 ENCOUNTER — Ambulatory Visit (INDEPENDENT_AMBULATORY_CARE_PROVIDER_SITE_OTHER): Payer: Medicare Other | Admitting: Family

## 2017-02-28 LAB — BASIC METABOLIC PANEL
BUN: 32 — AB (ref 4–21)
CREATININE: 1 (ref 0.6–1.3)
GLUCOSE: 218
POTASSIUM: 3.2 — AB (ref 3.4–5.3)
SODIUM: 131 — AB (ref 137–147)

## 2017-03-01 ENCOUNTER — Ambulatory Visit (INDEPENDENT_AMBULATORY_CARE_PROVIDER_SITE_OTHER): Payer: Medicare Other | Admitting: Family

## 2017-03-01 ENCOUNTER — Encounter (INDEPENDENT_AMBULATORY_CARE_PROVIDER_SITE_OTHER): Payer: Self-pay | Admitting: Family

## 2017-03-01 DIAGNOSIS — Z89511 Acquired absence of right leg below knee: Secondary | ICD-10-CM

## 2017-03-01 NOTE — Progress Notes (Signed)
Post-Op Visit Note   Patient: Preston Carter           Date of Birth: [birth year]           MRN: [id number] Visit Date: 03/01/2017 PCP: Leanna Battles, MD  Chief Complaint:  Chief Complaint  Patient presents with  . Right Leg - Routine Post Op    HPI:  HPI The patient is an 83 year old gentleman seen 3 week status post right BKA. Staples remain in place.  Residing at [facility]. Ortho Exam Incision healing well. Staples in place. No drainage, gaping or erythema. No sign of infection.  Visit Diagnoses:  1. S/P BKA (below knee amputation) unilateral, right ([place])     Plan: Staples harvested today. He will continue with physical therapy. Continue shrinker around-the-clock. Follow-up in office in 4 more weeks.  Follow-Up Instructions: Return in about 4 weeks (around 03/29/2017).   Imaging: No results found.  Orders:  No orders of the defined types were placed in this encounter.  No orders of the defined types were placed in this encounter.    PMFS History: Patient Active Problem List   Diagnosis Date Noted  . Vascular dementia 02/09/2017  . S/P BKA (below knee amputation) unilateral, right ([place]) 02/06/2017  . Hypokalemia 02/04/2017  . Type 2 diabetes mellitus with foot ulcer ([place]) 02/04/2017  . AKI (acute kidney injury) ([place]) 02/04/2017  . Subacute osteomyelitis, right ankle and foot ([place])   . Cellulitis of right lower extremity   . Sepsis ([place]) 02/03/2017  . Implantable loop recorder--LINQ 05/28/2013  . Fall 12/05/2012  . Sinoatrial node dysfunction ([place]) 12/05/2012  . Chronic kidney disease (CKD), stage III (moderate) 12/05/2012  . COPD-stage 2 05/18/2011  . Hyponatremia 05/14/2011  . Normocytic anemia 05/14/2011  . EXOGENOUS OBESITY 03/04/2008  . Obstructive sleep apnea 03/04/2008  . Seasonal and perennial allergic rhinitis 03/04/2008  . Allergic-infective asthma 03/04/2008  . PERIPHERAL EDEMA 03/04/2008   Past Medical History:  Diagnosis Date  . Anemia  05/14/2011  . Arthritis 05-03-11   most joints- Bil. hips/knees replaced in the past  . Asthma   . DEMENTIA 05-03-11   hx. Alzheimers-tx. Aricept  . Diabetes mellitus 05-03-11   Oral meds used-Dabetes x5 yrs-CBG's (145)  . Hypertension   . Irregular heart beat   . Neuromuscular disorder ([place]) 05-03-11   Dx. Parkinson's -essential tremors-generally, ambulates with walker  . Shortness of breath 05-03-11   SOB with exertion only  . Skin cancer 05-03-11   Past hx. skin cancer lesions-removed  . Stroke [facility]) 05-03-11   TIA- more than 20 yrs ago.    Family History  Problem Relation Age of Onset  . Heart disease Father     Past Surgical History:  Procedure Laterality Date  . AMPUTATION Right 02/05/2017   Procedure: AMPUTATION BELOW KNEE;  Surgeon: Newt Minion, MD;  Location: [facility];  Service: Orthopedics;  Laterality: Right;  . CARPAL TUNNEL RELEASE  05-03-11   bilateral  . CHOLECYSTECTOMY    . EYE SURGERY  05-03-11   lt. eye for lazy eye  . JOINT REPLACEMENT  05-03-11   Bil. hip/knee repalcements-now RT. Hip to be revised 05-11-11  . LOOP RECORDER IMPLANT N/A 12/05/2012   Procedure: LOOP RECORDER IMPLANT;  Surgeon: Deboraha Sprang, MD;  Location: [facility] CATH LAB;  Service: Cardiovascular;  Laterality: N/A;  . TONSILLECTOMY    . TOTAL HIP REVISION  05/11/2011   Procedure: TOTAL HIP REVISION;  Surgeon: Dione Plover Aluisio;  [facility];  Service: Orthopedics;  Laterality: Right;  . UVULOPALATOPHARYNGOPLASTY  05-03-11   x2 . Can.t tolerate cpap machine, doesn't use   Social History   Occupational History  . Not on file.   Social History Main Topics  . Smoking status: Former Smoker    Packs/day: 1.50    Years: 20.00    Quit date: 06/20/1977  . Smokeless tobacco: Never Used  . Alcohol use Not on file  . Drug use: Unknown  . Sexual activity: No

## 2017-03-02 ENCOUNTER — Telehealth (INDEPENDENT_AMBULATORY_CARE_PROVIDER_SITE_OTHER): Payer: Self-pay | Admitting: Orthopedic Surgery

## 2017-03-02 NOTE — Telephone Encounter (Signed)
Patient;s wife Pamala Hurry called left voicemail message asking if Dr Sharol Given can help her get equipment to get her husband home. She advised patient is in [place]. The number to contact Pamala Hurry is [phone]

## 2017-03-06 NOTE — Telephone Encounter (Signed)
I called and spoke with patients wife Preston Carter. She is really wanting husband home, however she is aware she would be physically not capable to take care of him at this time. Patient has to use bedpan and she cannot get this out from underneath him even at the SNF now. Advised even with a hoyer lift, or in home safety equipment it would be extremely difficult for patient to be at home, without full time aide or assistance. Unsure if insurance would cover and would end up costing more or comparable to the amount she is paying daily for him to stay at facility which is 160 dollars. She is going to encourage patient to try to strengthen himself in physical therapy so he can go home safely.

## 2017-03-14 ENCOUNTER — Telehealth (INDEPENDENT_AMBULATORY_CARE_PROVIDER_SITE_OTHER): Payer: Self-pay

## 2017-03-14 ENCOUNTER — Telehealth (INDEPENDENT_AMBULATORY_CARE_PROVIDER_SITE_OTHER): Payer: Self-pay | Admitting: Radiology

## 2017-03-14 NOTE — Telephone Encounter (Signed)
Preston Carter from [facility] was returning your call.  CB# is [phone].  Please Advise.  Thank You.

## 2017-03-14 NOTE — Telephone Encounter (Signed)
I called and left voicemail for social worker Nicolasa Ducking to see if she could provide more insight for [facility] for patient. Wife is requesting we fill this out and mail it to their home. She is wanting home health aide to help her take care of husband. This is something Education officer, museum normally would do, I will try to fill this out the best I can and mail to patients home.

## 2017-03-15 ENCOUNTER — Other Ambulatory Visit: Payer: Self-pay | Admitting: Dermatology

## 2017-03-15 NOTE — Telephone Encounter (Signed)
I called and spoke with [place] Education officer, museum, she will complete FL2 for the patient. Advised patients wife of this.

## 2017-03-21 ENCOUNTER — Non-Acute Institutional Stay (SKILLED_NURSING_FACILITY): Payer: Medicare Other | Admitting: Internal Medicine

## 2017-03-21 ENCOUNTER — Encounter: Payer: Self-pay | Admitting: Internal Medicine

## 2017-03-21 DIAGNOSIS — Z89511 Acquired absence of right leg below knee: Secondary | ICD-10-CM | POA: Diagnosis not present

## 2017-03-21 DIAGNOSIS — R1310 Dysphagia, unspecified: Secondary | ICD-10-CM

## 2017-03-21 DIAGNOSIS — F0150 Vascular dementia without behavioral disturbance: Secondary | ICD-10-CM

## 2017-03-21 DIAGNOSIS — R627 Adult failure to thrive: Secondary | ICD-10-CM | POA: Insufficient documentation

## 2017-03-21 NOTE — Assessment & Plan Note (Signed)
PCP should be involved in discussion of long-term care. A memory care unit would be optimal

## 2017-03-21 NOTE — Patient Instructions (Addendum)
See assessment and plan under each diagnosis in the problem list and acutely for this visit Total time 41 minutes; greater than 50% of the visit spent counseling patient and coordinating care for problems addressed at this encounter  

## 2017-03-21 NOTE — Assessment & Plan Note (Signed)
Social services consult to address needs ability of wife to meet these

## 2017-03-21 NOTE — Assessment & Plan Note (Signed)
Orthopedics saw the patient 3/54, no complications noted. Staples removed. Shrinker to be continued.  Continue PT/OT @ SNF Follow-up approximately 10/12.

## 2017-03-21 NOTE — Progress Notes (Signed)
NURSING HOME LOCATION:  [facility] ROOM NUMBER:  [id number]  CODE STATUS:  Full Code  PCP:  Leanna Battles, MD  [place] [address]  This is a nursing facility follow up for specific acute issue of health risk status.  Interim medical record and care since last [place] visit was updated with review of diagnostic studies and change in clinical status since last visit were documented.  JOA:CZYSA NP saw patient 02/27/17 ; he is at the SNF receiving services for strengthening, endurance, and cognitive training/assist. PT/OT reports easy fatigability. He was total care for all activities of daily living at that time. Harrel Lemon is required for transfer, he was not ambulating at that time. At this time he remains a 2 person assist and continues to require [facility] lift. 24 hour nursing care assistance was recommendation by PT/OT. It is reported that the patient's wife wants to take him home. Staff is concerned that his healthcare needs cannot be met at home. The patient's wife had informed staff that while he is in the SNF she is only receiving $500 a month and cannot live on that.PT/OT noted she has increased thoracic curvature suggesting osteoporosis and uses a wheelchair for mobilization.Her apparent infirmities make in unlikely she can provide level of care he needs.  Fasting glucoses ranged from 101- 226, lunch 123-154, dinner 102-325, and bedtime 247-253. Current labs reveal a normochromic normocytic anemia with hemoglobin of 11.1/hematocrit 34.3. Labs had revealed a persistent leukocytosis which was improving. His anemia was stable to improved. Creatinine is normal @ 1.03, GFR is normal at 66.57. He exhibits some prerenal azotemia with a BUN of 31.8. Potassium is 3.2 on 20 mEq K+mg daily, spironolactone 25 mg daily  & Lasix 40 mg bid . SLUMS score was FOUR/30 on initial assessment. At repeat it had improved significantly to 12/30 on 10/3.   Speech Therapy  evaluation 9/23 documented him pocketing food, but this is eventually expelled. He did exhibit overt signs and symptoms of aspiration with throat clearing with thin liquids 1 and 8 times with nectar liquids. He exhibited throat clearing on 4 occasions on a mechanical soft consistency. He was noted to have a delayed cough on 6 occasions. [facility] reassessed the patient 9/12. Staples were removed. He was to continue the shrinker for the BKA. He was to follow-up approximately 10/12. Dietary precautions were initiated  Review of systems: Dementia invalidated responses. He was unaware that he has had a BKA. He states he feels "so-so". He states he has occasional cough with sputum which he swallows and does not visualize.  Constitutional: No fever,significant weight change, fatigue  Eyes: No redness, discharge, pain, vision change ENT/mouth: No nasal congestion,  purulent discharge, earache,change in hearing ,sore throat  Cardiovascular: No chest pain, palpitations,paroxysmal nocturnal dyspnea, claudication, edema  Respiratory: No hemoptysis, DOE , significant snoring,apnea  Gastrointestinal: No heartburn,dysphagia,abdominal pain, nausea / vomiting,rectal bleeding, melena,change in bowels Genitourinary: No dysuria,hematuria, pyuria,  incontinence, nocturia Musculoskeletal: No joint stiffness, joint swelling, weakness,pain Dermatologic: No rash, pruritus, change in appearance of skin Neurologic: No dizziness,headache,syncope, seizures, numbness , tingling Psychiatric: No significant anxiety , depression, insomnia, anorexia Endocrine: No change in hair/skin/ nails, excessive thirst, excessive hunger, excessive urination  Hematologic/lymphatic: No significant bruising, lymphadenopathy,abnormal bleeding Allergy/immunology: No itchy/ watery eyes, significant sneezing, urticaria, angioedema  Physical exam:  Pertinent or positive findings: Has had intermittent bobbing tremor of the head. He is  hard of hearing. He is edentulous. Heart rhythm is irregular. He has  an intermittent slight cough which is nonproductive. Abdomen is massive. He has a BKA on the right. There is trace edema left lower extremity. Pedal pulses are decreased on the left. With stimuluss of the foot the left great toe is upgoing. He has hyperpigmentation of the left calf. Ecchymoses and hyperpigmented scars are present over the forearms with an eschar of the right forearm.  General appearance:Adequately nourished; no acute distress , increased work of breathing is present.   Lymphatic: No lymphadenopathy about the head, neck, axilla . Eyes: No conjunctival inflammation or lid edema is present. There is no scleral icterus. Ears:  External ear exam shows no significant lesions or deformities.   Nose:  External nasal examination shows no deformity or inflammation. Nasal mucosa are pink and moist without lesions ,exudates Oral exam: lips and gums are healthy appearing.There is no oropharyngeal erythema or exudate . Neck:  No thyromegaly, masses, tenderness noted.    Heart:  No gallop, murmur, click, rub .  Lungs: without wheezes, rhonchi,rales , rubs. Abdomen:Bowel sounds are normal. Abdomen is soft and nontender with no organomegaly, hernias,masses. GU: deferred  Extremities:  No cyanosis, clubbing  Neurologic exam : Skin: Warm & dry w/o tenting. No significant lesions or rash.  See summary under each active problem in the Problem List with associated updated therapeutic plan

## 2017-03-21 NOTE — Assessment & Plan Note (Addendum)
03/21/17 with wife's significant health issues, it is unrealistic to expect her to be able to provide level of care including dietary which are present

## 2017-03-29 ENCOUNTER — Ambulatory Visit (INDEPENDENT_AMBULATORY_CARE_PROVIDER_SITE_OTHER): Payer: Medicare Other | Admitting: Orthopedic Surgery

## 2017-04-05 ENCOUNTER — Ambulatory Visit (INDEPENDENT_AMBULATORY_CARE_PROVIDER_SITE_OTHER): Payer: Medicare Other | Admitting: Orthopedic Surgery

## 2017-04-11 ENCOUNTER — Ambulatory Visit (INDEPENDENT_AMBULATORY_CARE_PROVIDER_SITE_OTHER): Payer: Medicare Other | Admitting: Orthopedic Surgery

## 2017-04-11 ENCOUNTER — Encounter (INDEPENDENT_AMBULATORY_CARE_PROVIDER_SITE_OTHER): Payer: Self-pay

## 2017-04-20 DEATH — deceased

## 2019-06-27 IMAGING — DX DG FOOT 2V*R*
2 series · 2 of 2 positions shown · non-contrast
Comparison: Radiographs 10/31/2016, foot MRI 12/01/2016

CLINICAL DATA: Clinical concern for osteomyelitis.  Fever.

EXAM:
RIGHT FOOT - 2 VIEW

[foot ap]
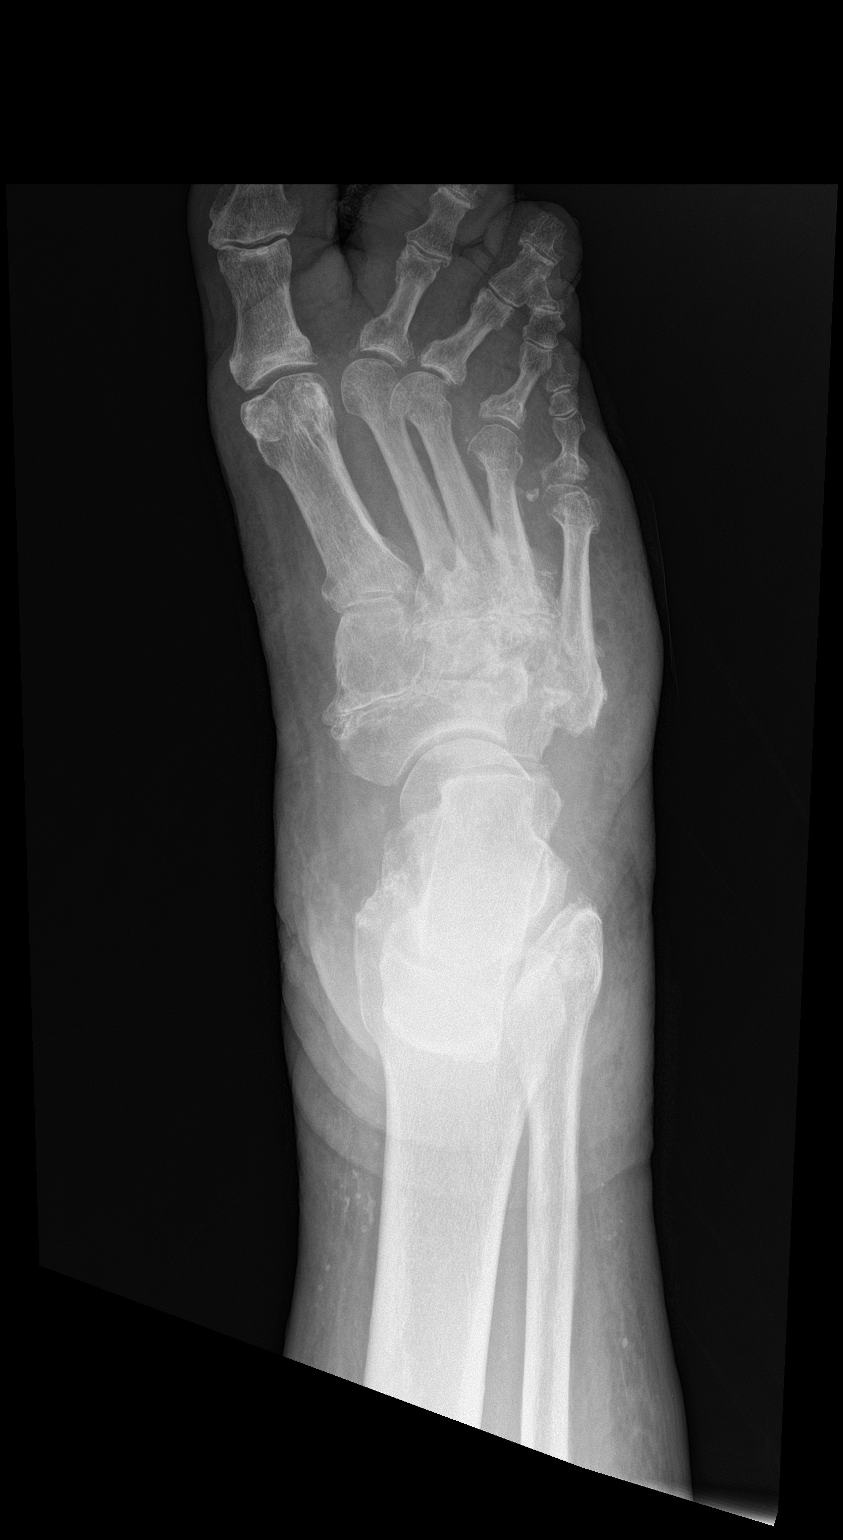

[foot lat]
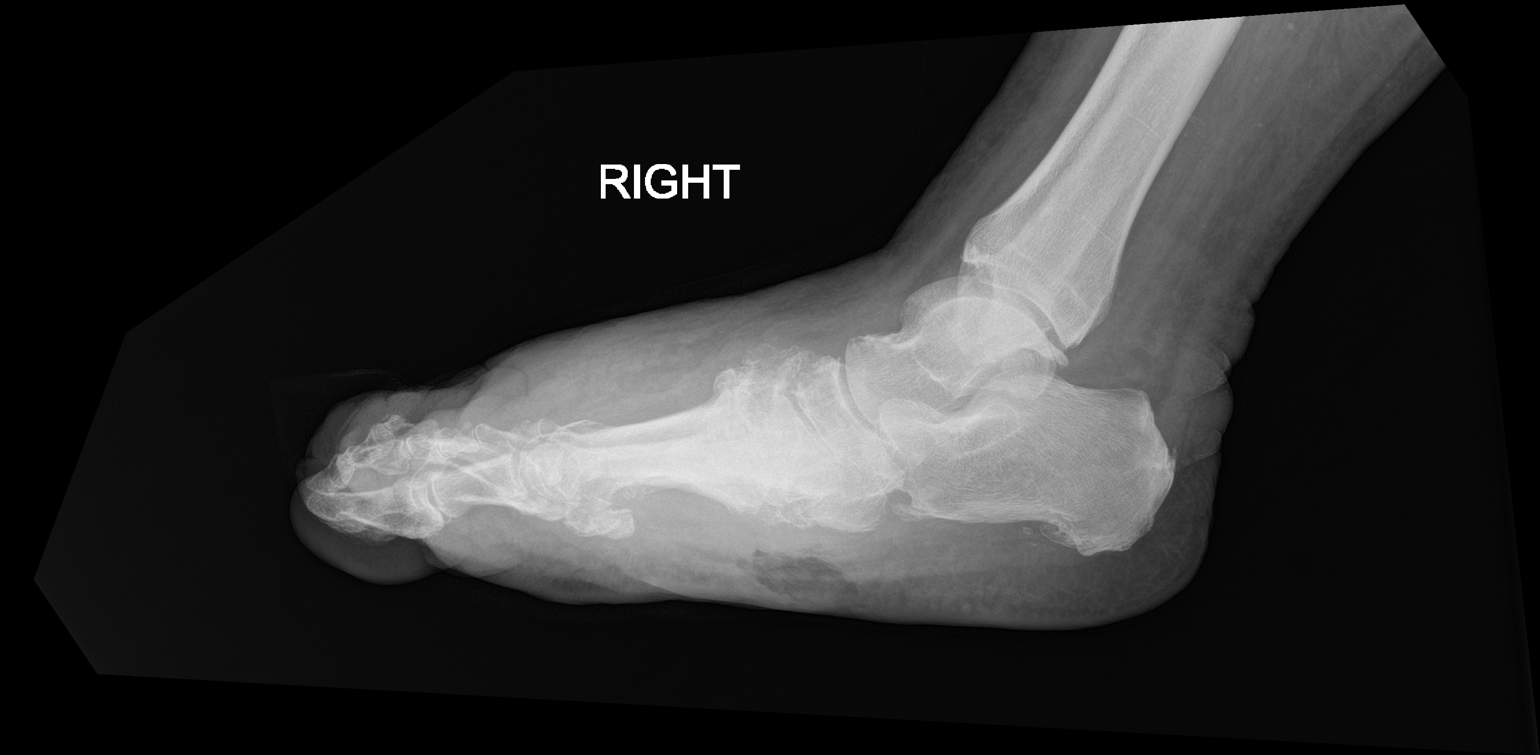

[2 of 2 positions shown; findings below may reference images not displayed]

FINDINGS: Large plantar skin/soft tissue defect. No radiopaque foreign body.
Question of increased periosteal reaction about proximal fourth
metatarsal. No other is no evidence of bony destructive change.
Charcot changes in the midfoot. Radiographic evaluation for
osteomyelitis in this setting is limited. Chronic lateral
subluxation of the second and third digits with associated soft
tissue edema. Marked dorsal soft tissue edema.
IMPRESSION: 1. Large plantar skin/soft tissue defect.
2. Questionable periosteal reaction about the proximal fourth
metatarsal, increased from prior exam. Osteomyelitis was seen in
this area on prior MRI. No additional radiographic findings of
osteomyelitis.
3. Chronic Charcot changes in the midfoot, second and third digit
subluxation and multifocal soft tissue edema.
